# Patient Record
Sex: Female | Born: 1950 | Race: Black or African American | Hispanic: No | Marital: Single | Smoking: Former smoker
Health system: Southern US, Community
[De-identification: ages and names within clinical notes are randomized; demographics above are authoritative.]

## PROBLEM LIST (undated history)

## (undated) DIAGNOSIS — J449 Chronic obstructive pulmonary disease, unspecified: Secondary | ICD-10-CM

## (undated) DIAGNOSIS — E662 Morbid (severe) obesity with alveolar hypoventilation: Secondary | ICD-10-CM

## (undated) DIAGNOSIS — G4733 Obstructive sleep apnea (adult) (pediatric): Secondary | ICD-10-CM

## (undated) DIAGNOSIS — R5381 Other malaise: Secondary | ICD-10-CM

## (undated) DIAGNOSIS — F419 Anxiety disorder, unspecified: Secondary | ICD-10-CM

## (undated) DIAGNOSIS — I5030 Unspecified diastolic (congestive) heart failure: Secondary | ICD-10-CM

## (undated) DIAGNOSIS — E785 Hyperlipidemia, unspecified: Secondary | ICD-10-CM

## (undated) DIAGNOSIS — I499 Cardiac arrhythmia, unspecified: Secondary | ICD-10-CM

## (undated) DIAGNOSIS — E119 Type 2 diabetes mellitus without complications: Secondary | ICD-10-CM

## (undated) DIAGNOSIS — J9610 Chronic respiratory failure, unspecified whether with hypoxia or hypercapnia: Secondary | ICD-10-CM

---

## 2000-08-06 ENCOUNTER — Emergency Department (HOSPITAL_COMMUNITY): Admission: EM | Admit: 2000-08-06 | Discharge: 2000-08-06 | Payer: Self-pay | Admitting: Emergency Medicine

## 2000-08-06 ENCOUNTER — Encounter: Payer: Self-pay | Admitting: Emergency Medicine

## 2000-11-19 ENCOUNTER — Encounter: Payer: Self-pay | Admitting: Family Medicine

## 2000-11-19 ENCOUNTER — Ambulatory Visit (HOSPITAL_COMMUNITY): Admission: RE | Admit: 2000-11-19 | Discharge: 2000-11-19 | Payer: Self-pay | Admitting: Family Medicine

## 2007-01-14 ENCOUNTER — Emergency Department (HOSPITAL_COMMUNITY): Admission: EM | Admit: 2007-01-14 | Discharge: 2007-01-14 | Payer: Self-pay | Admitting: Emergency Medicine

## 2007-02-05 ENCOUNTER — Inpatient Hospital Stay (HOSPITAL_COMMUNITY): Admission: EM | Admit: 2007-02-05 | Discharge: 2007-02-10 | Payer: Self-pay | Admitting: Emergency Medicine

## 2007-02-05 ENCOUNTER — Ambulatory Visit: Payer: Self-pay | Admitting: Internal Medicine

## 2007-02-07 ENCOUNTER — Encounter (INDEPENDENT_AMBULATORY_CARE_PROVIDER_SITE_OTHER): Payer: Self-pay | Admitting: Diagnostic Radiology

## 2007-02-10 DIAGNOSIS — J9620 Acute and chronic respiratory failure, unspecified whether with hypoxia or hypercapnia: Secondary | ICD-10-CM

## 2007-04-22 ENCOUNTER — Emergency Department (HOSPITAL_COMMUNITY): Admission: EM | Admit: 2007-04-22 | Discharge: 2007-04-22 | Payer: Self-pay | Admitting: Emergency Medicine

## 2007-04-23 ENCOUNTER — Ambulatory Visit: Payer: Self-pay | Admitting: Internal Medicine

## 2007-04-23 ENCOUNTER — Inpatient Hospital Stay (HOSPITAL_COMMUNITY): Admission: EM | Admit: 2007-04-23 | Discharge: 2007-04-29 | Payer: Self-pay | Admitting: Emergency Medicine

## 2007-04-27 ENCOUNTER — Encounter (INDEPENDENT_AMBULATORY_CARE_PROVIDER_SITE_OTHER): Payer: Self-pay | Admitting: Internal Medicine

## 2007-04-27 LAB — CONVERTED CEMR LAB
BUN: 5 mg/dL
CO2: 35 meq/L
Chloride: 98 meq/L
Creatinine, Ser: 0.68 mg/dL
Glucose, Bld: 138 mg/dL
Sodium: 139 meq/L

## 2007-04-28 ENCOUNTER — Ambulatory Visit: Payer: Self-pay | Admitting: Physical Medicine & Rehabilitation

## 2007-05-03 ENCOUNTER — Ambulatory Visit: Payer: Self-pay | Admitting: Nurse Practitioner

## 2007-05-03 DIAGNOSIS — G931 Anoxic brain damage, not elsewhere classified: Secondary | ICD-10-CM | POA: Insufficient documentation

## 2007-05-03 DIAGNOSIS — M1710 Unilateral primary osteoarthritis, unspecified knee: Secondary | ICD-10-CM

## 2007-05-03 DIAGNOSIS — E678 Other specified hyperalimentation: Secondary | ICD-10-CM

## 2007-05-03 DIAGNOSIS — R5381 Other malaise: Secondary | ICD-10-CM

## 2007-05-03 DIAGNOSIS — G4733 Obstructive sleep apnea (adult) (pediatric): Secondary | ICD-10-CM

## 2007-05-03 DIAGNOSIS — M549 Dorsalgia, unspecified: Secondary | ICD-10-CM | POA: Insufficient documentation

## 2007-05-03 DIAGNOSIS — J42 Unspecified chronic bronchitis: Secondary | ICD-10-CM | POA: Insufficient documentation

## 2007-05-03 DIAGNOSIS — R5383 Other fatigue: Secondary | ICD-10-CM

## 2007-05-04 ENCOUNTER — Telehealth (INDEPENDENT_AMBULATORY_CARE_PROVIDER_SITE_OTHER): Payer: Self-pay | Admitting: Nurse Practitioner

## 2007-05-16 ENCOUNTER — Telehealth (INDEPENDENT_AMBULATORY_CARE_PROVIDER_SITE_OTHER): Payer: Self-pay | Admitting: Nurse Practitioner

## 2007-05-18 ENCOUNTER — Ambulatory Visit: Payer: Self-pay | Admitting: Nurse Practitioner

## 2007-05-18 DIAGNOSIS — E876 Hypokalemia: Secondary | ICD-10-CM

## 2007-05-23 ENCOUNTER — Telehealth (INDEPENDENT_AMBULATORY_CARE_PROVIDER_SITE_OTHER): Payer: Self-pay | Admitting: Nurse Practitioner

## 2007-05-24 ENCOUNTER — Encounter (INDEPENDENT_AMBULATORY_CARE_PROVIDER_SITE_OTHER): Payer: Self-pay | Admitting: Nurse Practitioner

## 2007-06-15 ENCOUNTER — Ambulatory Visit: Payer: Self-pay | Admitting: Nurse Practitioner

## 2007-06-15 LAB — CONVERTED CEMR LAB
ALT: 17 units/L (ref 0–35)
AST: 17 units/L (ref 0–37)
Alkaline Phosphatase: 93 units/L (ref 39–117)
BUN: 13 mg/dL (ref 6–23)
Basophils Relative: 0 % (ref 0–1)
CO2: 28 meq/L (ref 19–32)
Chloride: 99 meq/L (ref 96–112)
Eosinophils Absolute: 0.2 10*3/uL (ref 0.0–0.7)
Glucose, Bld: 110 mg/dL — ABNORMAL HIGH (ref 70–99)
MCHC: 31.2 g/dL (ref 30.0–36.0)
Monocytes Absolute: 0.7 10*3/uL (ref 0.1–1.0)
Neutro Abs: 5.7 10*3/uL (ref 1.7–7.7)
Neutrophils Relative %: 58 % (ref 43–77)
Platelets: 334 10*3/uL (ref 150–400)
Potassium: 4.7 meq/L (ref 3.5–5.3)
Total Protein: 7.8 g/dL (ref 6.0–8.3)
WBC: 9.8 10*3/uL (ref 4.0–10.5)

## 2007-06-16 ENCOUNTER — Encounter (INDEPENDENT_AMBULATORY_CARE_PROVIDER_SITE_OTHER): Payer: Self-pay | Admitting: Nurse Practitioner

## 2007-06-25 ENCOUNTER — Ambulatory Visit (HOSPITAL_BASED_OUTPATIENT_CLINIC_OR_DEPARTMENT_OTHER): Admission: RE | Admit: 2007-06-25 | Discharge: 2007-06-25 | Payer: Self-pay | Admitting: Nurse Practitioner

## 2007-07-01 ENCOUNTER — Ambulatory Visit: Payer: Self-pay | Admitting: Internal Medicine

## 2007-07-04 ENCOUNTER — Encounter (INDEPENDENT_AMBULATORY_CARE_PROVIDER_SITE_OTHER): Payer: Self-pay | Admitting: Nurse Practitioner

## 2007-07-07 ENCOUNTER — Inpatient Hospital Stay (HOSPITAL_COMMUNITY): Admission: EM | Admit: 2007-07-07 | Discharge: 2007-07-11 | Payer: Self-pay | Admitting: Emergency Medicine

## 2007-07-10 ENCOUNTER — Encounter (INDEPENDENT_AMBULATORY_CARE_PROVIDER_SITE_OTHER): Payer: Self-pay | Admitting: Nurse Practitioner

## 2007-07-17 ENCOUNTER — Telehealth (INDEPENDENT_AMBULATORY_CARE_PROVIDER_SITE_OTHER): Payer: Self-pay | Admitting: Nurse Practitioner

## 2007-08-22 ENCOUNTER — Emergency Department (HOSPITAL_COMMUNITY): Admission: EM | Admit: 2007-08-22 | Discharge: 2007-08-22 | Payer: Self-pay | Admitting: Emergency Medicine

## 2007-08-24 ENCOUNTER — Encounter (INDEPENDENT_AMBULATORY_CARE_PROVIDER_SITE_OTHER): Payer: Self-pay | Admitting: Nurse Practitioner

## 2007-08-30 DIAGNOSIS — I509 Heart failure, unspecified: Secondary | ICD-10-CM

## 2007-08-31 ENCOUNTER — Encounter (INDEPENDENT_AMBULATORY_CARE_PROVIDER_SITE_OTHER): Payer: Self-pay | Admitting: Nurse Practitioner

## 2007-09-04 ENCOUNTER — Telehealth (INDEPENDENT_AMBULATORY_CARE_PROVIDER_SITE_OTHER): Payer: Self-pay | Admitting: Nurse Practitioner

## 2007-09-08 ENCOUNTER — Ambulatory Visit: Payer: Self-pay | Admitting: Nurse Practitioner

## 2007-09-08 DIAGNOSIS — G4700 Insomnia, unspecified: Secondary | ICD-10-CM

## 2007-09-18 ENCOUNTER — Telehealth (INDEPENDENT_AMBULATORY_CARE_PROVIDER_SITE_OTHER): Payer: Self-pay | Admitting: Nurse Practitioner

## 2007-09-20 ENCOUNTER — Encounter (INDEPENDENT_AMBULATORY_CARE_PROVIDER_SITE_OTHER): Payer: Self-pay | Admitting: Nurse Practitioner

## 2007-09-28 ENCOUNTER — Telehealth (INDEPENDENT_AMBULATORY_CARE_PROVIDER_SITE_OTHER): Payer: Self-pay | Admitting: Nurse Practitioner

## 2008-01-17 ENCOUNTER — Ambulatory Visit: Payer: Self-pay | Admitting: Nurse Practitioner

## 2008-01-17 DIAGNOSIS — Z9981 Dependence on supplemental oxygen: Secondary | ICD-10-CM

## 2008-01-17 DIAGNOSIS — L851 Acquired keratosis [keratoderma] palmaris et plantaris: Secondary | ICD-10-CM

## 2008-01-17 LAB — CONVERTED CEMR LAB
ALT: 23 units/L (ref 0–35)
BUN: 16 mg/dL (ref 6–23)
CO2: 31 meq/L (ref 19–32)
Calcium: 9.2 mg/dL (ref 8.4–10.5)
Chloride: 97 meq/L (ref 96–112)
Cholesterol: 243 mg/dL — ABNORMAL HIGH (ref 0–200)
Eosinophils Absolute: 0.2 10*3/uL (ref 0.0–0.7)
Glucose, Bld: 110 mg/dL — ABNORMAL HIGH (ref 70–99)
HDL: 87 mg/dL (ref >39)
Monocytes Absolute: 0.7 10*3/uL (ref 0.1–1.0)
Monocytes Relative: 6 % (ref 3–12)
Neutrophils Relative %: 58 % (ref 43–77)
RDW: 14 % (ref 11.5–15.5)
Sodium: 141 meq/L (ref 135–145)
Total Protein: 7.6 g/dL (ref 6.0–8.3)

## 2008-01-19 ENCOUNTER — Encounter (INDEPENDENT_AMBULATORY_CARE_PROVIDER_SITE_OTHER): Payer: Self-pay | Admitting: Nurse Practitioner

## 2008-01-19 ENCOUNTER — Telehealth (INDEPENDENT_AMBULATORY_CARE_PROVIDER_SITE_OTHER): Payer: Self-pay | Admitting: Nurse Practitioner

## 2008-01-23 ENCOUNTER — Encounter (INDEPENDENT_AMBULATORY_CARE_PROVIDER_SITE_OTHER): Payer: Self-pay | Admitting: Nurse Practitioner

## 2008-01-31 ENCOUNTER — Telehealth (INDEPENDENT_AMBULATORY_CARE_PROVIDER_SITE_OTHER): Payer: Self-pay | Admitting: Nurse Practitioner

## 2008-02-09 ENCOUNTER — Encounter (INDEPENDENT_AMBULATORY_CARE_PROVIDER_SITE_OTHER): Payer: Self-pay | Admitting: Nurse Practitioner

## 2008-02-22 ENCOUNTER — Encounter (INDEPENDENT_AMBULATORY_CARE_PROVIDER_SITE_OTHER): Payer: Self-pay | Admitting: Nurse Practitioner

## 2008-03-13 ENCOUNTER — Ambulatory Visit: Payer: Self-pay | Admitting: Pulmonary Disease

## 2008-03-13 DIAGNOSIS — J9610 Chronic respiratory failure, unspecified whether with hypoxia or hypercapnia: Secondary | ICD-10-CM

## 2008-03-13 DIAGNOSIS — R0602 Shortness of breath: Secondary | ICD-10-CM | POA: Insufficient documentation

## 2008-04-17 ENCOUNTER — Ambulatory Visit: Payer: Self-pay | Admitting: Pulmonary Disease

## 2008-04-17 DIAGNOSIS — I5032 Chronic diastolic (congestive) heart failure: Secondary | ICD-10-CM

## 2008-04-17 DIAGNOSIS — J438 Other emphysema: Secondary | ICD-10-CM | POA: Insufficient documentation

## 2008-04-23 ENCOUNTER — Encounter (INDEPENDENT_AMBULATORY_CARE_PROVIDER_SITE_OTHER): Payer: Self-pay | Admitting: Nurse Practitioner

## 2008-05-29 ENCOUNTER — Encounter (INDEPENDENT_AMBULATORY_CARE_PROVIDER_SITE_OTHER): Payer: Self-pay | Admitting: Nurse Practitioner

## 2008-06-06 ENCOUNTER — Telehealth (INDEPENDENT_AMBULATORY_CARE_PROVIDER_SITE_OTHER): Payer: Self-pay | Admitting: Nurse Practitioner

## 2008-06-24 ENCOUNTER — Telehealth (INDEPENDENT_AMBULATORY_CARE_PROVIDER_SITE_OTHER): Payer: Self-pay | Admitting: Nurse Practitioner

## 2008-07-26 ENCOUNTER — Encounter (INDEPENDENT_AMBULATORY_CARE_PROVIDER_SITE_OTHER): Payer: Self-pay | Admitting: Nurse Practitioner

## 2008-07-30 ENCOUNTER — Encounter (INDEPENDENT_AMBULATORY_CARE_PROVIDER_SITE_OTHER): Payer: Self-pay | Admitting: Nurse Practitioner

## 2008-08-02 ENCOUNTER — Encounter (INDEPENDENT_AMBULATORY_CARE_PROVIDER_SITE_OTHER): Payer: Self-pay | Admitting: Nurse Practitioner

## 2008-09-17 ENCOUNTER — Telehealth (INDEPENDENT_AMBULATORY_CARE_PROVIDER_SITE_OTHER): Payer: Self-pay | Admitting: Nurse Practitioner

## 2008-11-25 ENCOUNTER — Encounter (INDEPENDENT_AMBULATORY_CARE_PROVIDER_SITE_OTHER): Payer: Self-pay | Admitting: Nurse Practitioner

## 2009-02-11 ENCOUNTER — Encounter (INDEPENDENT_AMBULATORY_CARE_PROVIDER_SITE_OTHER): Payer: Self-pay | Admitting: Nurse Practitioner

## 2009-03-24 IMAGING — CT CT HEAD W/O CM
1 series · 16 of 30 positions shown, 20 images · IV contrast (agent unspecified)
Comparison: None.

CLINICAL DATA: Dizziness, diplopia.
 HEAD CT WITHOUT CONTRAST:
TECHNIQUE: Contiguous axial images were obtained from the base of the skull through the vertex according to standard protocol without contrast.

[Series 2: headseq 4.8 h45s · axial · 0.43mm/px · z∈[-201,-73]mm · 16 of 30 slices shown, 20 images]
[im 2/30  brain]
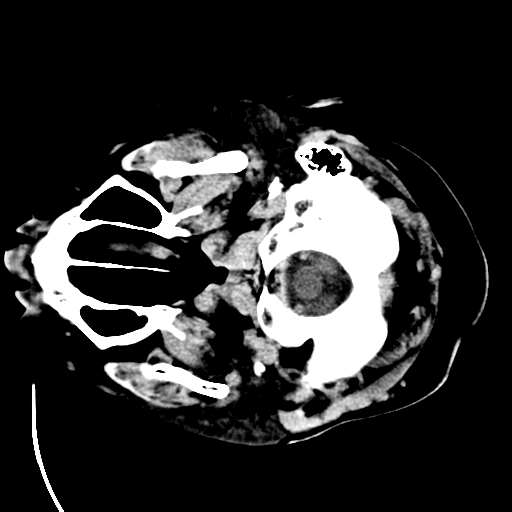
[im 2/30  bone]
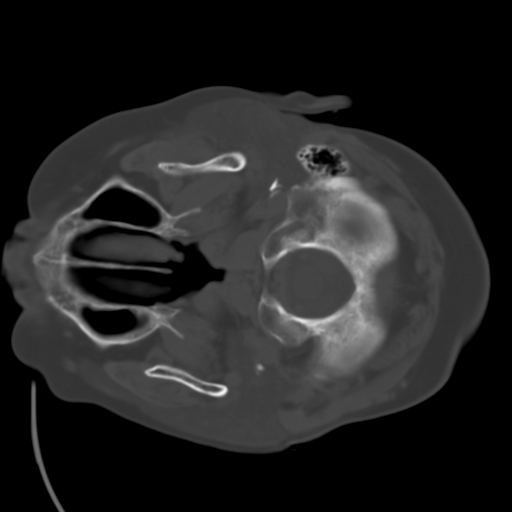
[im 4/30  brain]
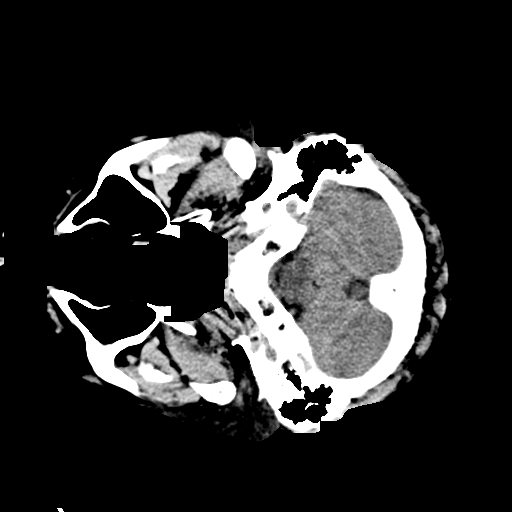
[im 6/30  brain]
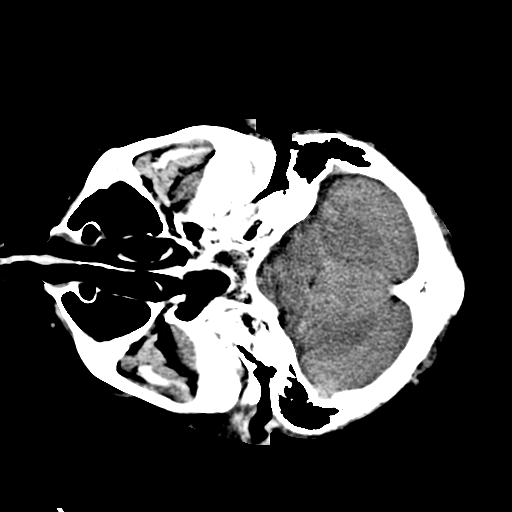
[im 8/30  brain]
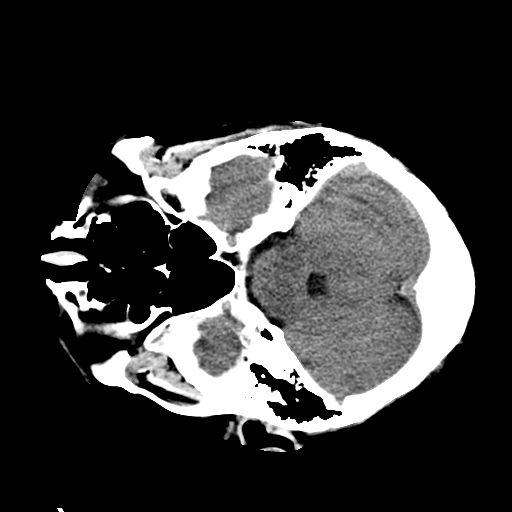
[im 9/30  brain]
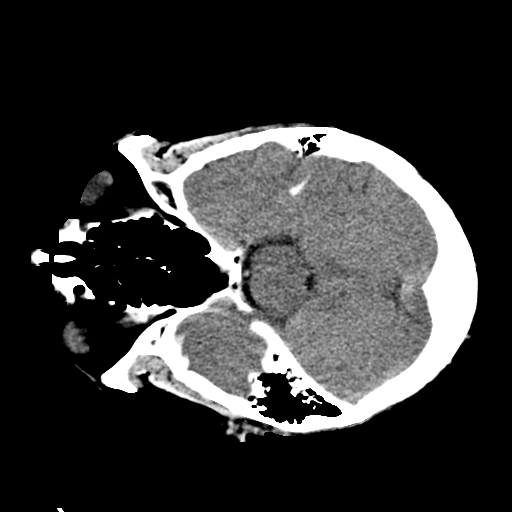
[im 9/30  bone]
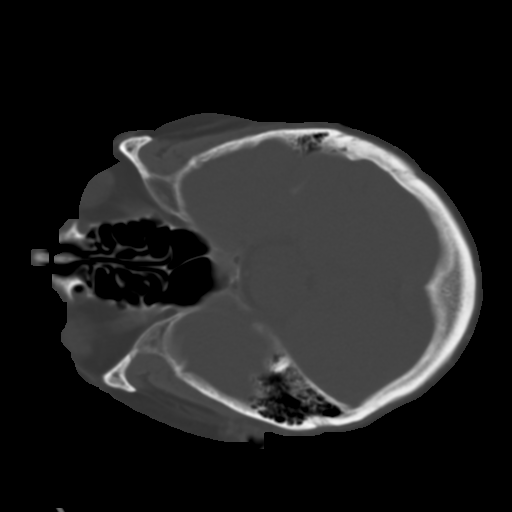
[im 11/30  brain]
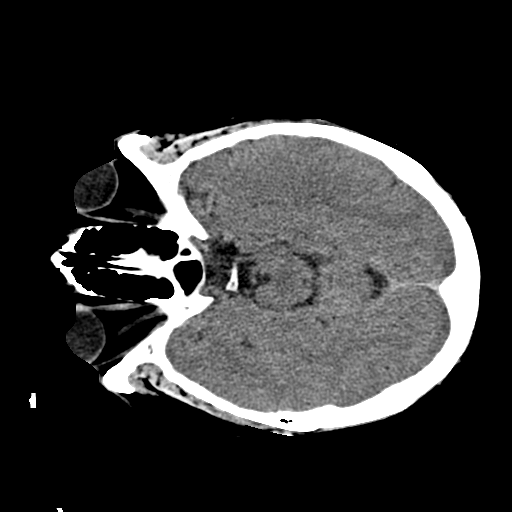
[im 13/30  brain]
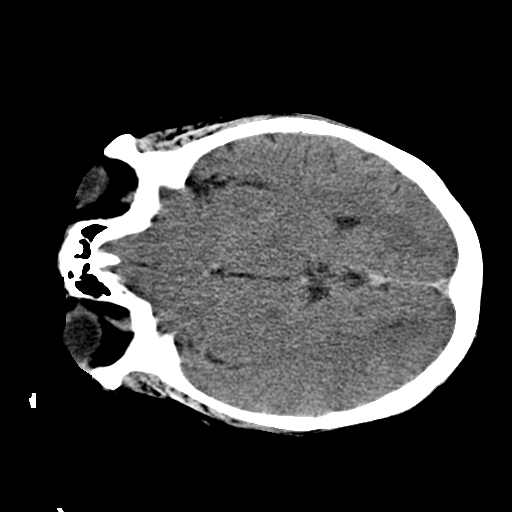
[im 15/30  brain]
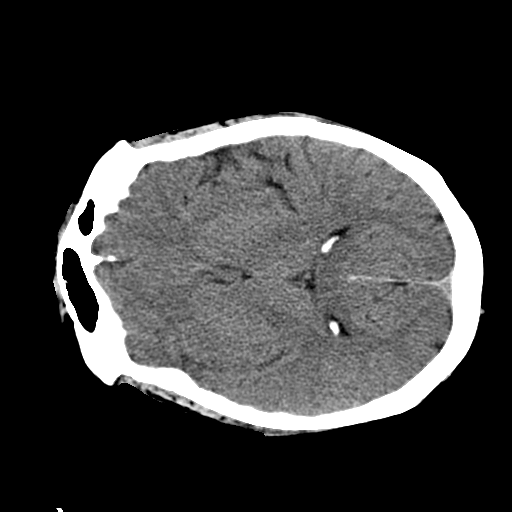
[im 16/30  brain]
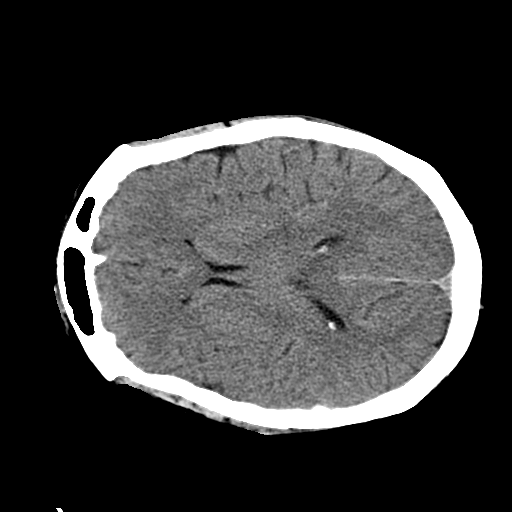
[im 16/30  bone]
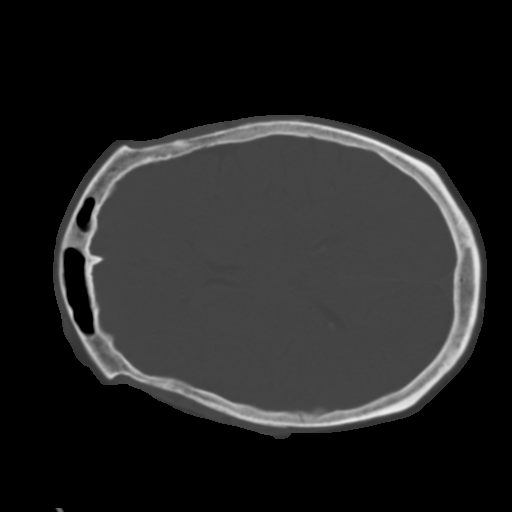
[im 18/30  brain]
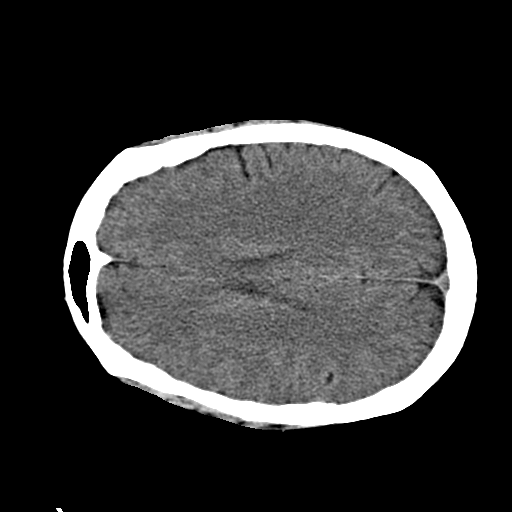
[im 20/30  brain]
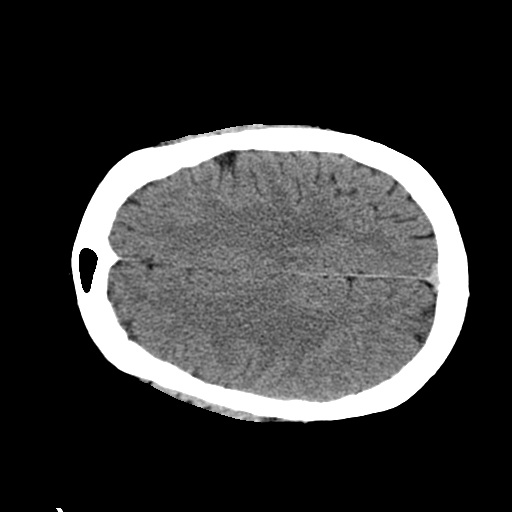
[im 22/30  brain]
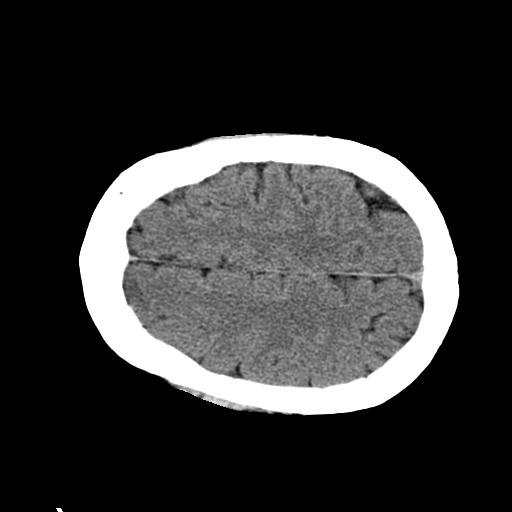
[im 23/30  brain]
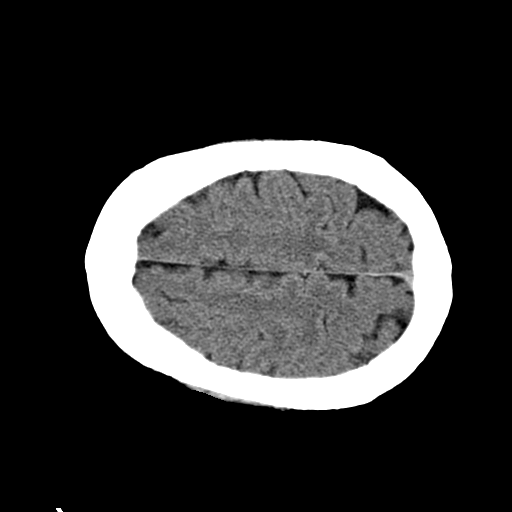
[im 23/30  bone]
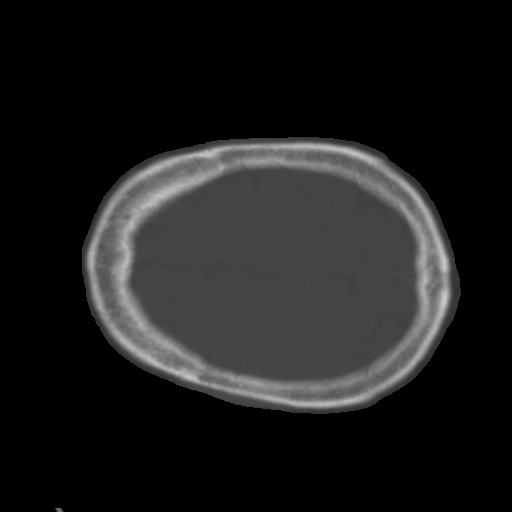
[im 25/30  brain]
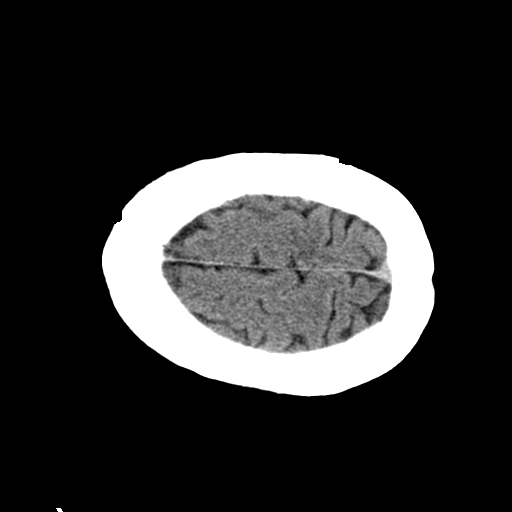
[im 27/30  brain]
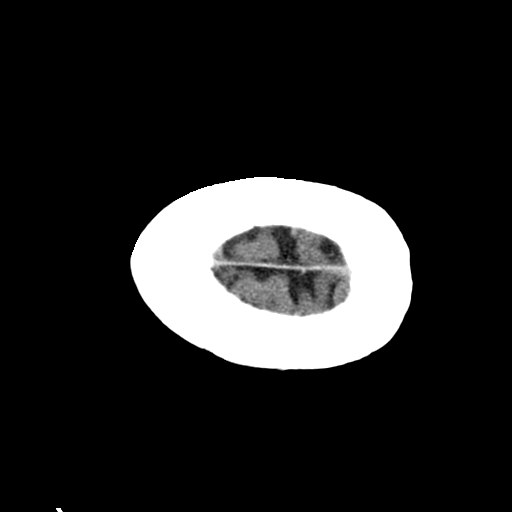
[im 29/30  brain]
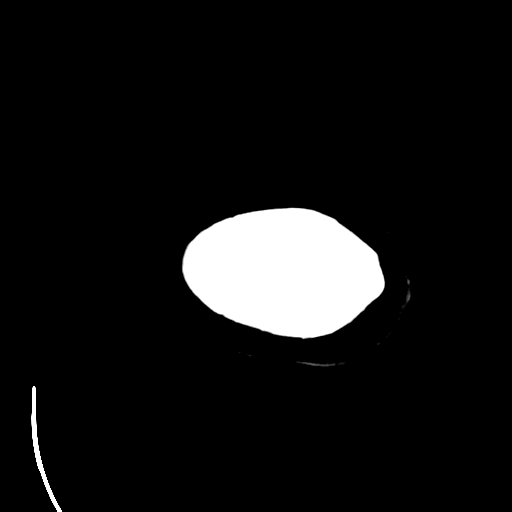

[16 of 30 positions shown; findings below may reference images not displayed]

FINDINGS: The patient was imaged right side down, as she was unable to lie supine for the exam.  There is no evidence of intracranial hemorrhage, brain edema, acute infarct, mass lesion, or mass effect.  No other intraaxial abnormalities are seen, and the ventricles are within normal limits.  No abnormal extraaxial fluid collections or masses are identified.  No skull abnormalities are noted.
IMPRESSION: Negative non-contrast head CT.

## 2009-04-30 ENCOUNTER — Ambulatory Visit: Payer: Self-pay | Admitting: Nurse Practitioner

## 2009-04-30 ENCOUNTER — Telehealth (INDEPENDENT_AMBULATORY_CARE_PROVIDER_SITE_OTHER): Payer: Self-pay | Admitting: Nurse Practitioner

## 2009-04-30 DIAGNOSIS — R209 Unspecified disturbances of skin sensation: Secondary | ICD-10-CM | POA: Insufficient documentation

## 2009-05-21 ENCOUNTER — Ambulatory Visit: Payer: Self-pay | Admitting: Nurse Practitioner

## 2009-06-02 ENCOUNTER — Encounter (INDEPENDENT_AMBULATORY_CARE_PROVIDER_SITE_OTHER): Payer: Self-pay | Admitting: Nurse Practitioner

## 2009-06-05 DIAGNOSIS — E7800 Pure hypercholesterolemia, unspecified: Secondary | ICD-10-CM

## 2009-06-05 LAB — CONVERTED CEMR LAB
ALT: 16 units/L (ref 0–35)
AST: 15 units/L (ref 0–37)
Albumin: 4 g/dL (ref 3.5–5.2)
BUN: 11 mg/dL (ref 6–23)
Basophils Absolute: 0 10*3/uL (ref 0.0–0.1)
Calcium: 9.2 mg/dL (ref 8.4–10.5)
Creatinine, Ser: 0.55 mg/dL (ref 0.40–1.20)
Eosinophils Relative: 2 % (ref 0–5)
Glucose, Bld: 120 mg/dL — ABNORMAL HIGH (ref 70–99)
HDL: 79 mg/dL (ref >39)
Lymphs Abs: 3.2 10*3/uL (ref 0.7–4.0)
MCV: 102.2 fL — ABNORMAL HIGH (ref 78.0–100.0)
Monocytes Relative: 6 % (ref 3–12)
Platelets: 287 10*3/uL (ref 150–400)
Potassium: 5.1 meq/L (ref 3.5–5.3)
RDW: 13.9 % (ref 11.5–15.5)
Sodium: 141 meq/L (ref 135–145)
Total Protein: 7.7 g/dL (ref 6.0–8.3)

## 2009-06-16 ENCOUNTER — Telehealth (INDEPENDENT_AMBULATORY_CARE_PROVIDER_SITE_OTHER): Payer: Self-pay | Admitting: *Deleted

## 2009-06-17 ENCOUNTER — Encounter (INDEPENDENT_AMBULATORY_CARE_PROVIDER_SITE_OTHER): Payer: Self-pay | Admitting: Nurse Practitioner

## 2009-07-08 ENCOUNTER — Telehealth (INDEPENDENT_AMBULATORY_CARE_PROVIDER_SITE_OTHER): Payer: Self-pay | Admitting: Nurse Practitioner

## 2009-07-23 ENCOUNTER — Encounter (INDEPENDENT_AMBULATORY_CARE_PROVIDER_SITE_OTHER): Payer: Self-pay | Admitting: Nurse Practitioner

## 2009-08-11 ENCOUNTER — Encounter (INDEPENDENT_AMBULATORY_CARE_PROVIDER_SITE_OTHER): Payer: Self-pay | Admitting: Nurse Practitioner

## 2009-08-13 ENCOUNTER — Telehealth (INDEPENDENT_AMBULATORY_CARE_PROVIDER_SITE_OTHER): Payer: Self-pay | Admitting: *Deleted

## 2009-08-14 ENCOUNTER — Encounter (INDEPENDENT_AMBULATORY_CARE_PROVIDER_SITE_OTHER): Payer: Self-pay | Admitting: Nurse Practitioner

## 2009-11-21 ENCOUNTER — Encounter (INDEPENDENT_AMBULATORY_CARE_PROVIDER_SITE_OTHER): Payer: Self-pay | Admitting: Nurse Practitioner

## 2010-02-16 ENCOUNTER — Telehealth (INDEPENDENT_AMBULATORY_CARE_PROVIDER_SITE_OTHER): Payer: Self-pay | Admitting: Nurse Practitioner

## 2010-03-13 ENCOUNTER — Telehealth (INDEPENDENT_AMBULATORY_CARE_PROVIDER_SITE_OTHER): Payer: Self-pay | Admitting: Nurse Practitioner

## 2010-03-31 ENCOUNTER — Ambulatory Visit: Payer: Self-pay | Admitting: Nurse Practitioner

## 2010-04-15 ENCOUNTER — Encounter (INDEPENDENT_AMBULATORY_CARE_PROVIDER_SITE_OTHER): Payer: Self-pay | Admitting: Nurse Practitioner

## 2010-04-22 ENCOUNTER — Encounter (INDEPENDENT_AMBULATORY_CARE_PROVIDER_SITE_OTHER): Payer: Self-pay | Admitting: Nurse Practitioner

## 2010-04-22 DIAGNOSIS — L732 Hidradenitis suppurativa: Secondary | ICD-10-CM | POA: Insufficient documentation

## 2010-04-22 LAB — CONVERTED CEMR LAB: LDL Goal: 160 mg/dL

## 2010-04-23 ENCOUNTER — Encounter (INDEPENDENT_AMBULATORY_CARE_PROVIDER_SITE_OTHER): Payer: Self-pay | Admitting: Nurse Practitioner

## 2010-04-23 ENCOUNTER — Encounter: Payer: Self-pay | Admitting: Internal Medicine

## 2010-04-23 LAB — CONVERTED CEMR LAB
AST: 17 units/L (ref 0–37)
Alkaline Phosphatase: 90 units/L (ref 39–117)
CO2: 39 meq/L — ABNORMAL HIGH (ref 19–32)
Calcium: 9.3 mg/dL (ref 8.4–10.5)
Cholesterol: 225 mg/dL — ABNORMAL HIGH (ref 0–200)
HDL: 81 mg/dL (ref >39)
LDL Cholesterol: 125 mg/dL — ABNORMAL HIGH (ref 0–99)
Sodium: 145 meq/L (ref 135–145)
Total Bilirubin: 0.2 mg/dL — ABNORMAL LOW (ref 0.3–1.2)
Total CHOL/HDL Ratio: 2.8
Total Protein: 7.6 g/dL (ref 6.0–8.3)

## 2010-04-28 ENCOUNTER — Other Ambulatory Visit (HOSPITAL_COMMUNITY): Payer: Self-pay | Admitting: Internal Medicine

## 2010-04-28 DIAGNOSIS — Z1239 Encounter for other screening for malignant neoplasm of breast: Secondary | ICD-10-CM

## 2010-04-29 ENCOUNTER — Encounter (INDEPENDENT_AMBULATORY_CARE_PROVIDER_SITE_OTHER): Payer: Self-pay | Admitting: Nurse Practitioner

## 2010-04-30 ENCOUNTER — Ambulatory Visit (HOSPITAL_COMMUNITY): Admission: RE | Admit: 2010-04-30 | Payer: Self-pay | Source: Home / Self Care | Admitting: Internal Medicine

## 2010-05-05 NOTE — Letter (Signed)
Summary: FORM FOR STUDENT LOAN PAYMENT//MAILED TO PT  FORM FOR STUDENT LOAN PAYMENT//MAILED TO PT   Imported By: Arta Bruce 07/23/2009 10:53:11  _____________________________________________________________________  External Attachment:    Type:   Image     Comment:   External Document

## 2010-05-05 NOTE — Progress Notes (Signed)
Summary: Letter Request  Phone Note Call from Patient Call back at Home Phone [phone] Call back at [phone]   Summary of Call: The pt wants that the provider write down a letter that allow her to receive the mail (door to door) [place] [address]  The letter should explain that the pt use oxygen tank  and she is in wheel chair.  The pt needs this letter asap to mail to her  because she needs to send back to the post office.  [facility] FNP Initial call taken by: Manon Hilding,  Aug 13, 2009 4:27 PM  Follow-up for Phone Call        forward to N. Daphine Deutscher, FNP  Additional Follow-up for Phone Call Additional follow up Details #1::        letter done in office for pickup notify pt Additional Follow-up by: Lehman Prom FNP,  Aug 14, 2009 9:00 AM    Additional Follow-up for Phone Call Additional follow up Details #2::    called pt/want me to mail letter, said thank you so much [place] Follow-up by: Arta Bruce,  Aug 14, 2009 10:05 AM

## 2010-05-05 NOTE — Assessment & Plan Note (Signed)
Summary: Emphysema   Vital Signs:  Patient profile:   60 year old female Weight:      253.1 pounds BMI:     41.95 BSA:     2.19 O2 Sat:      100 % on 2 L/min Temp:     98.2 degrees F oral Pulse rate:   119 / minute Pulse rhythm:   regular Resp:     20 per minute BP sitting:   126 / 82  (left arm) Cuff size:   regular  Vitals Entered By: Levon Hedger (April 30, 2009 12:03 PM)  O2 Flow:  2 L/min CC: needs flu and pneumonia shot pt said she got flu shot here last year and pneumonia shot was given 2 years ago at the hospital....tingling in fingers at night , CHF Management Is Patient Diabetic? No Pain Assessment Patient in pain? no       Does patient need assistance? Functional Status Self care Ambulation Normal   Primary Care Provider:  Daphine Deutscher [facility])  CC:  needs flu and pneumonia shot pt said she got flu shot here last year and pneumonia shot was given 2 years ago at the hospital....tingling in fingers at night  and CHF Management.  History of Present Illness:  Pt into the office for routine f/u.  Pt doing relatively well.  Presents today in a wheelchair manual which is hers personally. She is SOB with very little activity.  No use of cane of walker at home because she is just too out of breath.  Obesity - down 10 pounds since her last visit. No meaningful activity at home.  Oxygen - last pulmonary visit was 1 year ago.  Still using oxygen, [facility]  Social - lives with son   Habits & Providers  Alcohol-Tobacco-Diet     Alcohol drinks/day: 0     Tobacco Status: quit     Tobacco Counseling: to remain off tobacco products     Year Quit: 2005  Exercise-Depression-Behavior     Does Patient Exercise: no     Have you felt down or hopeless? no     Have you felt little pleasure in things? no     Drug Use: no     Seat Belt Use: 0     Sun Exposure: rarely  Allergies (verified): 1)  ! Codeine  Review of Systems CV:  Complains of  swelling of hands; intermittent. Resp:  Complains of shortness of breath; with ambulation. GI:  Denies abdominal pain, nausea, and vomiting. MS:  swelling noticed in the left hand but no pain. Neuro:  Complains of tingling; in hand when laying on the right side.  Does not lay on the left side due to breathing.  She has been sleeping on the side for years. Admits that she does also bends her arm while asleep.  when she wakes in the morning the tingling resolves within 30 minutes of getting out of bed.  concern that it is almost every morning now when she wakes.  Physical Exam  General:  alert.   Head:  normocephalic.   Nose:  [place] in place Lungs:  normal breath sounds.   Heart:  normal rate and regular rhythm.   Neurologic:  alert & oriented X3.   Skin:  bil hands with discoloration Psych:  Oriented X3.     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 1 puff in the morning **rinse mouth after use** 2)  Omeprazole 20 Mg Tbec (Omeprazole) .Marland Kitchen.. 1 tablet by mouth two times a day for stomach 3)  Ventolin Hfa 108 (90 Base) Mcg/act Aers (Albuterol sulfate) .... 2 puffs every 6 hours as needed for shortness of breath 4)  Nebulizer Compressor Misc (Nebulizers) .... Dispense  machine to use with nebulizer dx: acute respiratory failure, hypoventilation syndrome 5)  Duoneb 0.5-2.5 (3) Mg/40ml Soln (Ipratropium-albuterol) .... One vial inhalation three times a day as needed for breathing 6)  Oxygen 2 Liters Pulsed  .... 24/7 7)  Hydrochlorothiazide 25 Mg Tabs (Hydrochlorothiazide) .... Take one (1) by mouth daily as needed for fluid 8)  Trazodone Hcl 50 Mg Tabs (Trazodone hcl) .... One tablet by mouth nightly as needed for sleep  Other Orders: Flu Vaccine 3yrs + ([address]) Admin 1st Vaccine ([address]) Admin 1st Vaccine [facility]) [phone])  CHF Assessment/Plan:      The patient's current weight is 253.1 pounds.  Her previous weight was 263 pounds.    Patient Instructions: 1)  You have been given the flu vaccine today. 2)  You will need to do exercise for the legs. 3)  stretch up against the wall for 5 times each legs 4)  also roll on a can back and forth  5)  You can use the weights/ cans in your hand to lift start with 1 pound. 6)  Follow up in 2 weeks for fasting labs 7)  Do not eat after midnight before this visit 8)  Will need lipids, cmp, cbc, tsh. Prescriptions: ADVAIR DISKUS 250-50 MCG/DOSE  MISC (FLUTICASONE-SALMETEROL) 1 puff in the morning **Rinse mouth after use**  #1 x 6   Entered and Authorized by:   Lehman Prom FNP   Signed by:   Lehman Prom FNP on 04/30/2009   Method used:   Printed then faxed to ...       [facility] (retail)       [address]       [place], [place]  [address]       Ph: [phone]       Fax: [phone]   RxID:   [id number] TRAZODONE HCL 50 MG TABS (TRAZODONE HCL) One tablet by mouth nightly as needed for sleep  #30 x 0   Entered and Authorized by:   Lehman Prom FNP   Signed by:   Lehman Prom FNP on 04/30/2009   Method used:   Printed then faxed to ...       [facility] (retail)       [address]       [place], [place]  [address]       Ph:  [phone]       Fax: [phone]   RxID:   [id number]  HYDROCHLOROTHIAZIDE 25 MG TABS (HYDROCHLOROTHIAZIDE) Take one (1) by mouth daily as needed for fluid  #30 x 1   Entered and Authorized by:   Lehman Prom FNP   Signed by:   Lehman Prom FNP on 04/30/2009   Method used:   Print then Give to Patient   RxID:   [id number] HYDROCHLOROTHIAZIDE 25 MG TABS (HYDROCHLOROTHIAZIDE) Take one (1) by mouth daily as needed for fluid  #30 x 1   Entered and Authorized by:   Lehman Prom FNP   Signed by:   Lehman Prom FNP on 04/30/2009   Method used:   Print then Give to Patient   RxID:   [id number]    Influenza Vaccine    Vaccine Type: Fluvax 3+    Site: left deltoid    Mfr: Sanofi Pasteur    Dose: 0.5 ml    Route: IM    Given by: Levon Hedger    Exp. Date: 10/02/2009    Lot #: [id number]    VIS given: 10/27/06 version given April 30, 2009.  Flu Vaccine Consent Questions    Do you have a history of severe allergic reactions to this vaccine? no    Any prior history of allergic reactions to egg and/or gelatin? no    Do you have a sensitivity to the preservative Thimersol? no    Do you have a past history of Guillan-Barre Syndrome? no    Do you currently have an acute febrile illness? no    Have you ever had a severe reaction to latex? no    Vaccine information given and explained to patient? yes    Are you currently pregnant? no

## 2010-05-05 NOTE — Letter (Signed)
Summary: *Referral Letter  [facility]  [address] [place], [place] [address]   Phone: [phone]  Fax: [phone]    08/14/2009   [name] [address] [place], [place]  [address]  Phone: [phone]  To whom it may concern:   This letter is regarding [name]. See below for her medical problems.  Of significance, is her arthritis which limits her mobility and thus she is wheelchair bound.  She also has some respiratory illnesses for which she requires 24 hour oxygen.  Her mobility is significantly decreased.  Therefore, it is the request of this provider to have her mail delivered to her door.  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  Sincerely,   Juliet Rude [facility]

## 2010-05-05 NOTE — Progress Notes (Signed)
  Phone Note Call from Patient Call back at [facility] Phone [phone]   Summary of Call: Pt totally forgot to let know the provider during the visit that she has headache, sinus infection and her ear has some fluid.  Pt is not taking anything for this problem.[facility] [phone]. [facility] FNP Initial call taken by: Manon Hilding,  April 30, 2009 2:29 PM  Follow-up for Phone Call        pt can take lorantadine 10mg  by mouth daily as needed for the symptoms.  Rx in basket Follow-up by: Lehman Prom FNP,  April 30, 2009 2:55 PM  Additional Follow-up for Phone Call Additional follow up Details #1::        RX FAXED TO [facility] TODAY Additional Follow-up by: Leodis Rains,  May 01, 2009 11:42 AM    New/Updated Medications: LORATADINE 10 MG TABS (LORATADINE) One tablet by mouth daily as needed for allergies  Prescriptions: LORATADINE 10 MG TABS (LORATADINE) One tablet by mouth daily as needed for allergies  #30 x 3   Entered and Authorized by:   Lehman Prom FNP   Signed by:   Lehman Prom FNP on 04/30/2009   Method used:   Printed then faxed to ...       [facility] (retail)       [address]       [place], [place]  [address]       Ph: [phone]       Fax: [phone]   RxID:   [id number]

## 2010-05-05 NOTE — Letter (Signed)
Summary: DURABLE MEDICAL EQUIPMENT  DURABLE MEDICAL EQUIPMENT   Imported By: Arta Bruce 08/11/2009 14:39:06  _____________________________________________________________________  External Attachment:    Type:   Image     Comment:   External Document

## 2010-05-05 NOTE — Progress Notes (Signed)
Summary: Query:  REFILL pravastatin?  Phone Note Call from Patient Call back at Home Phone [phone]   Reason for Call: Refill Medication Summary of Call: MARTIN PT. MS [place]oster CALLED FOR REFILLS ON HER CHOLESTEROL MEDS.  SHE [facility].  MS Fetterly SCHEDU AN APPT. FOR NEXT AVAILABLE 12-27 Initial call taken by: Leodis Rains,  February 16, 2010 2:29 PM  Follow-up for Phone Call        Do you want to refill her meds?  Last seen 04/2009.  Dutch Quint RN  February 16, 2010 2:59 PM   Additional Follow-up for Phone Call Additional follow up Details #1::        yes give her 30 days worth. will refill with more on f/u Additional Follow-up by: Lehman Prom FNP,  February 16, 2010 4:19 PM    Additional Follow-up for Phone Call Additional follow up Details #2::    Refill completed.  Dutch Quint RN  February 16, 2010 4:22 PM   Prescriptions: PRAVASTATIN SODIUM 10 MG TABS (PRAVASTATIN SODIUM) One tablet by mouth nightly for cholesterol  #30 x 0   Entered by:   Dutch Quint RN   Authorized by:   Lehman Prom FNP   Signed by:   Dutch Quint RN on 02/16/2010   Method used:   Faxed to ...       [facility] (retail)       [address]       [place], [place]  [address]       Ph: [phone]       Fax: [phone]   RxID:   [id number]

## 2010-05-05 NOTE — Letter (Signed)
Summary: discharge application  discharge application   Imported By: Arta Bruce 06/17/2009 08:40:42  _____________________________________________________________________  External Attachment:    Type:   Image     Comment:   External Document

## 2010-05-05 NOTE — Progress Notes (Signed)
Summary: Form  Phone Note Outgoing Call   Summary of Call: form completed and at Dr.Mulberry's desk to sign make copy for the chart then send original back when she signs Initial call taken by: Lehman Prom FNP,  June 16, 2009 9:17 AM  Follow-up for Phone Call        CALLED PT LET HER KNOW FORM IS IN THE MAIL Follow-up by: Arta Bruce,  June 17, 2009 8:44 AM

## 2010-05-05 NOTE — Progress Notes (Signed)
Summary: Medicaid wont cover her Advair  Phone Note Call from Patient Call back at Home Phone [phone]   Reason for Call: Refill Medication Summary of Call: martin pt. ms Cyrene Starliper says that [facility] told her that Medicaid wont pay for her Advair. She says that she has about 2 or 3 puffs left. Initial call taken by: Leodis Rains,  July 08, 2009 4:56 PM  Follow-up for Phone Call        forward to N. Daphine Deutscher, FNP Follow-up by: Levon Hedger,  July 09, 2009 12:27 PM  Additional Follow-up for Phone Call Additional follow up Details #1::        notify pt that prior approval obtained today pharmacy can resend the Rx and medicaid should cover Additional Follow-up by: Lehman Prom FNP,  July 09, 2009 12:53 PM    Additional Follow-up for Phone Call Additional follow up Details #2::    pt informed of above information.  Pt has medication Follow-up by: Levon Hedger,  July 11, 2009 9:09 AM  New/Updated Medications: ADVAIR DISKUS 250-50 MCG/DOSE  MISC (FLUTICASONE-SALMETEROL) 1 puff in the morning **Rinse mouth after use**

## 2010-05-05 NOTE — Letter (Signed)
Summary: DURABLE MEDICAL EQUIPMENT  DURABLE MEDICAL EQUIPMENT   Imported By: Arta Bruce 11/24/2009 16:32:06  _____________________________________________________________________  External Attachment:    Type:   Image     Comment:   External Document

## 2010-05-05 NOTE — Letter (Signed)
Summary: REQUESTED RECORDS MAILED TO [facility]  REQUESTED RECORDS MAILED TO [facility]   Imported By: Arta Bruce 06/02/2009 11:35:08  _____________________________________________________________________  External Attachment:    Type:   Image     Comment:   External Document

## 2010-05-06 ENCOUNTER — Encounter: Payer: Self-pay | Admitting: Internal Medicine

## 2010-05-07 NOTE — Letter (Signed)
Summary: Lipid Letter  [facility]  [address] [place], [place] [address]   Phone: [phone]  Fax: [phone]    04/23/2010  Shakena Callari [address] [place], [place]  [address]  Dear Ms. Hellickson:  We have carefully reviewed your last lipid profile from 04/22/2010 and the results are noted below with a summary of recommendations for lipid management.    Cholesterol:       225     Goal: less than 200   HDL "good" Cholesterol:   81     Goal: greater than 40   LDL "bad" Cholesterol:   125     Goal: less than 100   Triglycerides:       96     Goal: less than 150    Cholesterol is still slighty elevated.  You should have spoken with this office regarding an increase in your medications.     Current Medications: 1)    Ventolin Hfa 108 (90 Base) Mcg/act  Aers (Albuterol sulfate) .... 2 puffs every 6 hours as needed for shortness of breath 2)    Nebulizer Compressor  Misc (Nebulizers) .... Dispense machine to use with nebulizer dx: acute respiratory failure, hypoventilation syndrome 3)    Duoneb 0.5-2.5 (3) Mg/42ml Soln (Ipratropium-albuterol) .... One vial inhalation three times a day as needed for breathing 4)    Oxygen 2 Liters Pulsed  .... 24/7 5)    Hydrochlorothiazide 25 Mg Tabs (Hydrochlorothiazide) .... Take one (1) by mouth daily as needed for fluid 6)    Trazodone Hcl 50 Mg Tabs (Trazodone hcl) .... One tablet by mouth nightly as needed for sleep 7)    Cetirizine Hcl 10 Mg Tabs (Cetirizine hcl) .... One tablet by mouth nightly for allergies 8)    Pravastatin Sodium 20 Mg Tabs (Pravastatin sodium) .... One tablet by mouth nightly for cholesterl **note increase in dose** 9)    Cephalexin 500 Mg Caps (Cephalexin) .... One tablet by mouth three times a day for infection  If you have any questions, please call. We appreciate being able to work with you.   Sincerely,    [facility] Lehman Prom FNP

## 2010-05-07 NOTE — Letter (Signed)
Summary: [facility] MEDICAL EQUIPMENT//FAXED  [facility] MEDICAL EQUIPMENT//FAXED   Imported By: Arta Bruce 04/29/2010 12:10:56  _____________________________________________________________________  External Attachment:    Type:   Image     Comment:   External Document

## 2010-05-07 NOTE — Assessment & Plan Note (Signed)
Summary: Skin problems   Vital Signs:  Patient profile:   60 year old female Weight:      261.4 pounds BMI:     43.32 O2 Sat:      86 % on 2 L/min Temp:     97.3 degrees F oral Pulse rate:   124 / minute Pulse rhythm:   regular Resp:     26 per minute BP sitting:   130 / 80  (left arm) Cuff size:   regular  Vitals Entered By: Levon Hedger (April 22, 2010 12:42 PM)  Nutrition Counseling: Patient's BMI is greater than 25 and therefore counseled on weight management options.  O2 Flow:  2 L/min CC: needs a physical...swelling around nipple area, Lipid Management Is Patient Diabetic? No Pain Assessment Patient in pain? no       Does patient need assistance? Functional Status Self care Ambulation Normal   Primary Care Provider:  Daphine Deutscher [facility])  CC:  needs a physical...swelling around nipple area and Lipid Management.  History of Present Illness:  Pt into the office with c/o breast and left underarm tenderness reports areas under left axilla "bust" and drain this has been ongoing for years left breast - nippled most affected. Similar papular lesions that drain and then crust over  Lipid Management History:      Positive NCEP/ATP III risk factors include female age 45 years old or older.  Negative NCEP/ATP III risk factors include no history of early menopause without estrogen hormone replacement, non-diabetic, HDL cholesterol greater than 60, no family history for ischemic heart disease, non-tobacco-user status, non-hypertensive, no ASHD (atherosclerotic heart disease), no prior stroke/TIA, no peripheral vascular disease, and no history of aortic aneurysm.        She expresses no side effects from her lipid-lowering medication.  The patient denies any symptoms to suggest myopathy or liver disease.     Allergies (verified): 1)  ! Codeine  Review of Systems General:  Complains of sleep disorder; denies fatigue. CV:  Denies chest pain or discomfort. Resp:   Denies cough. GI:  Denies abdominal pain, nausea, and vomiting blood. Derm:  Complains of lesion(s).  Physical Exam  General:  alert.   Head:  normocephalic.   Neurologic:  wheelchair Skin:  dry left axilla - nodular tender lesions  left breast - areola with papular lesions x 2, crusty drainage Psych:  Oriented X3.     Impression & Recommendations:  Problem # 1:  HIDRADENITIS SUPPURATIVA (ICD-705.83) advised pt of dx apply warm compresses to affected area  Problem # 2:  HYPERCHOLESTEROLEMIA (ICD-272.0) will check labs today Her updated medication list for this problem includes:    Pravastatin Sodium 10 Mg Tabs (Pravastatin sodium) ..... One tablet by mouth nightly for cholesterol  Orders: T-Lipid Profile ([address]109-60454) T-Comprehensive Metabolic Panel (09811-91478)  Problem # 3:  UNSPECIFIED BREAST SCREENING (ICD-V76.10) no mammogram in many years will schedule Orders: Mammogram (Screening) (Mammo)  Problem # 4:  NEED PROPHYLACTIC VACCINATION&INOCULATION FLU (ICD-V04.81) given today in office  Complete Medication List: 1)  Ventolin Hfa 108 (90 Base) Mcg/act Aers (Albuterol sulfate) .... 2 puffs every 6 hours as needed for shortness of breath 2)  Nebulizer Compressor Misc (Nebulizers) .... Dispense machine to use with nebulizer dx: acute respiratory failure, hypoventilation syndrome 3)  Duoneb 0.5-2.5 (3) Mg/56ml Soln (Ipratropium-albuterol) .... One vial inhalation three times a day as needed for breathing 4)  Oxygen 2 Liters Pulsed  .... 24/7 5)  Hydrochlorothiazide 25 Mg Tabs (Hydrochlorothiazide) .... Take one (  1) by mouth daily as needed for fluid 6)  Trazodone Hcl 50 Mg Tabs (Trazodone hcl) .... One tablet by mouth nightly as needed for sleep 7)  Cetirizine Hcl 10 Mg Tabs (Cetirizine hcl) .... One tablet by mouth nightly for allergies 8)  Pravastatin Sodium 10 Mg Tabs (Pravastatin sodium) .... One tablet by mouth nightly for cholesterol 9)  Cephalexin 500 Mg Caps  (Cephalexin) .... One tablet by mouth three times a day for infection  Other Orders: Flu Vaccine 76yrs + (60109) Admin 1st Vaccine (32355) Pulse Oximetry (single measurment) (73220)  Lipid Assessment/Plan:      Based on NCEP/ATP III, the patient's risk factor category is "0-1 risk factors".  The patient's lipid goals are as follows: Total cholesterol goal is 200; LDL cholesterol goal is 160; HDL cholesterol goal is 40; Triglyceride goal is 150.    Patient Instructions: 1)  You have been given the flu vaccine today. 2)  You have been scheduled for a mammogram.  Please be sure to keep this appointment. 3)  Breast and under arm - apply warm compresses. 4)  You should start keflex 500mg  by mouth three times a day for infection 5)  Your labs will be checked and you will be notified of the results. 6)  If you re-consider and decide you would like this provider to contact care agencies to see if you qualify for some in home assistance then inform this provider 7)  Call for a follow up in 6 months or sooner if necessary 8)  Will need to discuss getting colonscopy Prescriptions: CEPHALEXIN 500 MG CAPS (CEPHALEXIN) One tablet by mouth three times a day for infection  #30 x 0   Entered and Authorized by:   Lehman Prom FNP   Signed by:   Lehman Prom FNP on 04/22/2010   Method used:   Electronically to        [facility] (retail)       [address]       [address]       [place], [place]  [address]       Ph: [phone]       Fax: [phone]   RxID:   [id number] TRAZODONE HCL 50 MG TABS (TRAZODONE HCL) One tablet by mouth nightly as needed for sleep  #30 x 1   Entered and Authorized by:   Lehman Prom FNP   Signed by:   Lehman Prom FNP on 04/22/2010   Method used:   Electronically to        [facility] (retail)       [address]       [place], [place]  [address]       Ph: [phone]       Fax: [phone]   RxID:    [id number] CETIRIZINE HCL 10 MG TABS (CETIRIZINE HCL) One tablet by mouth nightly for allergies  #30 x 5   Entered and Authorized by:   Lehman Prom FNP   Signed by:   Lehman Prom FNP on 04/22/2010   Method used:   Electronically to        [facility] (retail)       [address]       [place], [place]  [address]       Ph: [phone]       Fax: [phone]  RxID:   [id number] HYDROCHLOROTHIAZIDE 25 MG TABS (HYDROCHLOROTHIAZIDE) Take one (1) by mouth daily as needed for fluid  #30 x 1   Entered and Authorized by:   Lehman Prom FNP   Signed by:   Lehman Prom FNP on 04/22/2010   Method used:   Electronically to        [facility] (retail)       [address]       [place], [place]  [address]       Ph: [phone]       Fax: [phone]   RxID:   [id number]    Orders Added: 1)  Flu Vaccine 11yrs + [64403] 2)  Admin 1st Vaccine [90471] 3)  Mammogram (Screening) [Mammo] 4)  Est. Patient Level IV [47425] 5)  T-Lipid Profile [80061-22930] 6)  T-Comprehensive Metabolic Panel [80053-22900] 7)  Pulse Oximetry (single measurment) [94760]   Immunizations Administered:  Influenza Vaccine # 1:    Vaccine Type: Fluvax 3+    Site: left deltoid    Mfr: GlaxoSmithKline    Dose: 0.5 ml    Route: IM    Given by: Levon Hedger    Exp. Date: 10/03/2010    Lot #: [id number]    VIS given: 10/28/09 version given April 22, 2010.  Flu Vaccine Consent Questions:    Do you have a history of severe allergic reactions to this vaccine? no    Any prior history of allergic reactions to egg and/or gelatin? no    Do you have a sensitivity to the preservative Thimersol? no    Do you have a past history of Guillan-Barre Syndrome? no    Do you currently have an acute febrile illness? no    Have you ever had a severe reaction to latex? no    Vaccine information given and explained to patient? yes    Are  you currently pregnant? no    ndc  [phone]  Immunizations Administered:  Influenza Vaccine # 1:    Vaccine Type: Fluvax 3+    Site: left deltoid    Mfr: GlaxoSmithKline    Dose: 0.5 ml    Route: IM    Given by: Levon Hedger    Exp. Date: 10/03/2010    Lot #: [id number]    VIS given: 10/28/09 version given April 22, 2010.

## 2010-05-07 NOTE — Letter (Signed)
Summary: TEST ORDER FORM MAMMOGRAM//APPT DATE & TIME  TEST ORDER FORM MAMMOGRAM//APPT DATE & TIME   Imported By: Arta Bruce 04/24/2010 14:35:16  _____________________________________________________________________  External Attachment:    Type:   Image     Comment:   External Document

## 2010-05-07 NOTE — Progress Notes (Signed)
Summary: Query:  Refill pravastatin?  Phone Note Outgoing Call   Summary of Call: Has appt. 12/27.  Last seen 04/2009.  Refill pravastatin? Initial call taken by: Dutch Quint RN,  March 13, 2010 6:14 PM  Follow-up for Phone Call        yes, ok to refill call pt and remind her to be fasting for labs at her appt on 12/27 Follow-up by: Lehman Prom FNP,  March 16, 2010 8:05 AM  Additional Follow-up for Phone Call Additional follow up Details #1::        Rx. faxed to [facility]. Left message on answer machine for pt. to return call.  Gaylyn Cheers RN  March 16, 2010 9:15 AM       Additional Follow-up for Phone Call Additional follow up Details #2::    pt is aware Follow-up by: Arm[place] Shannon,  March 16, 2010 10:21 AM  Prescriptions: PRAVASTATIN SODIUM 10 MG TABS (PRAVASTATIN SODIUM) One tablet by mouth nightly for cholesterol  #30 x 0   Entered by:   Gaylyn Cheers RN   Authorized by:   Lehman Prom FNP   Signed by:   Gaylyn Cheers RN on 03/16/2010   Method used:   Print then Give to Patient   RxID:   [id number]

## 2010-05-07 NOTE — Letter (Signed)
Summary: [facility]   Imported By: Arta Bruce 04/17/2010 16:52:15  _____________________________________________________________________  External Attachment:    Type:   Image     Comment:   External Document

## 2010-05-12 ENCOUNTER — Ambulatory Visit (HOSPITAL_COMMUNITY)
Admission: RE | Admit: 2010-05-12 | Discharge: 2010-05-12 | Disposition: A | Discharge status: Home / Self Care | Payer: Medicaid Other | Source: Ambulatory Visit | Attending: Internal Medicine | Admitting: Internal Medicine

## 2010-05-12 ENCOUNTER — Other Ambulatory Visit: Payer: Self-pay | Admitting: Internal Medicine

## 2010-05-12 DIAGNOSIS — Z1239 Encounter for other screening for malignant neoplasm of breast: Secondary | ICD-10-CM

## 2010-05-12 DIAGNOSIS — N6320 Unspecified lump in the left breast, unspecified quadrant: Secondary | ICD-10-CM

## 2010-05-13 ENCOUNTER — Encounter (INDEPENDENT_AMBULATORY_CARE_PROVIDER_SITE_OTHER): Payer: Self-pay | Admitting: Nurse Practitioner

## 2010-05-13 NOTE — Letter (Signed)
Summary: [facility]  [facility]   Imported By: Lester Catano 05/05/2010 08:28:09  _____________________________________________________________________  External Attachment:    Type:   Image     Comment:   External Document

## 2010-05-18 ENCOUNTER — Ambulatory Visit
Admission: RE | Admit: 2010-05-18 | Discharge: 2010-05-18 | Disposition: A | Discharge status: Home / Self Care | Payer: Medicaid Other | Source: Ambulatory Visit | Attending: Internal Medicine | Admitting: Internal Medicine

## 2010-05-18 DIAGNOSIS — N6320 Unspecified lump in the left breast, unspecified quadrant: Secondary | ICD-10-CM

## 2010-05-21 NOTE — Letter (Signed)
Summary: MED/SOLUTION//APPROVED  MED/SOLUTION//APPROVED   Imported By: Arta Bruce 05/14/2010 11:30:15  _____________________________________________________________________  External Attachment:    Type:   Image     Comment:   External Document

## 2010-05-28 ENCOUNTER — Encounter (INDEPENDENT_AMBULATORY_CARE_PROVIDER_SITE_OTHER): Payer: Self-pay | Admitting: Nurse Practitioner

## 2010-05-28 ENCOUNTER — Telehealth (INDEPENDENT_AMBULATORY_CARE_PROVIDER_SITE_OTHER): Payer: Self-pay | Admitting: Nurse Practitioner

## 2010-06-02 NOTE — Letter (Signed)
Summary: [facility]//FAXED  [facility]//FAXED   Imported By: Arta Bruce 05/28/2010 11:17:07  _____________________________________________________________________  External Attachment:    Type:   Image     Comment:   External Document

## 2010-06-02 NOTE — Progress Notes (Signed)
Summary: ADVAIR NEEDS REFILL  Phone Note Call from Patient Call back at Home Phone [phone]   Reason for Call: Refill Medication Summary of Call: [name] SAYS SHE NEEDS A OTHER REFILL ON HER ADVAIR INHALER. [facility]. Initial call taken by: Leodis Rains,  May 28, 2010 10:12 AM  Follow-up for Phone Call        Advair was removed from her med list at last OV on 04/22/10 - reason unknown.  Dutch Quint RN  May 28, 2010 10:17 AM   Additional Follow-up for Phone Call Additional follow up Details #1::        med d/c'ed because pt had not been taking it for 6 months. Hopefully she is not symptomatic which is why she wants it now will re-order and fax to [facility] as requested Additional Follow-up by: Lehman Prom FNP,  May 28, 2010 10:36 AM    New/Updated Medications: ADVAIR DISKUS 250-50 MCG/DOSE AEPB (FLUTICASONE-SALMETEROL) One inhalation two times a day for breathing Prescriptions: ADVAIR DISKUS 250-50 MCG/DOSE AEPB (FLUTICASONE-SALMETEROL) One inhalation two times a day for breathing  #1 x 6   Entered and Authorized by:   Lehman Prom FNP   Signed by:   Lehman Prom FNP on 05/28/2010   Method used:   Electronically to        [facility]* (retail)       [address]       [place], [place]  [address]       Ph: [phone]       Fax: [phone]   RxID:   [id number]   Appended Document: ADVAIR NEEDS REFILL Pt. notified of refill - it's already been delivered.  Takes it once a day, not two.  States she's been taking it all along, no change in condition.  Dutch Quint RN  May 28, 2010  2:08 PM

## 2010-06-04 ENCOUNTER — Encounter (INDEPENDENT_AMBULATORY_CARE_PROVIDER_SITE_OTHER): Payer: Self-pay | Admitting: Nurse Practitioner

## 2010-06-11 NOTE — Letter (Signed)
Summary: [facility]//DURABLE MEDICAL EQUIPMENT //FAXED  [facility]//DURABLE MEDICAL EQUIPMENT //FAXED   Imported By: Arta Bruce 06/04/2010 09:45:35  _____________________________________________________________________  External Attachment:    Type:   Image     Comment:   External Document

## 2010-08-18 NOTE — Discharge Summary (Signed)
NAMEGARI, TROVATO                 ACCOUNT NO.:  [id number]   MEDICAL RECORD NO.:  [id number]          PATIENT TYPE:  INP   LOCATION:  1231                         FACILITY:  [facility]   PHYSICIAN:  Elliot Cousin, M.D.    DATE OF BIRTH:  26-Nov-1950   DATE OF ADMISSION:  02/05/2007  DATE OF DISCHARGE:  02/10/2007                               DISCHARGE SUMMARY   DISCHARGE DIAGNOSES:  1. Progressive dyspnea thought to be secondary to obesity      hypoventilation syndrome, COPD, and possibly obstructive sleep      apnea.  2. Chronic hypercapnic and hypoxic respiratory failure requiring      nocturnal BiPAP.  (The settings are 6/12).  3. Last ABG recorded, on February 09, 2007, revealed a pH of 7.38, pCO2      of 60.2, and a pO2 of 77.6.  The ABG was on BiPAP and an FIO2 of      30%.  The patient's oxygen saturation on the day of hospital      discharge was 84% on room air.  4. Altered mental status, dizziness, vertigo, and myoclonic movements,      all thought to be secondary to hypercapnia and hypoxia.  These      symptoms completely resolved prior to hospital discharge.  5. Morbid obesity.  6. Low TSH but normal free T4.  7. Deconditioning.   DISCHARGE MEDICATIONS:  1. BiPAP 6/12 q.h.s.  2. Oxygen 2-3 liters per minute.  3. Prednisone taper take as prescribed.  4. Albuterol MDI 2 puffs every 4 hours as needed.  5. Spiriva one inhalation daily.  6. Symbicort 160/4.5 two puffs b.i.d.   DISCHARGE DISPOSITION:  The patient was discharged to home in improved  and stable condition on February 10, 2007.  She was advised to follow up  with her primary care physician in 1-2 weeks.  She was advised to follow  up with pulmonologist Dr. Craige Cotta on December 2 at 2:30 p.m.Marland Kitchen  She also has  a sleep study scheduled for November 11 at 8:30 p.m.   CONSULTATIONS:  1. [facility].  2. Delia Heady, M.D.   PROCEDURE PERFORMED:  1. MRI of the brain without contrast on February 06, 2007.  The  results      revealed a suboptimal study without any gross evidence of acute      ischemia.  Mild thickening of mucosa in the ethmoid and the      maxillary sinuses.  2. Lumbar puncture performed by the interventional radiologist on      February 07, 2007.  3. EEG performed on February 06, 2007.  The results revealed severe      diffuse generalized slowing consistent with encephalopathy.      However, would consider subclinical seizure activity if the      clinical setting is appropriate.  4. EEG repeated on February 09, 2007.  The results revealed diffuse      slowing and FIRDA.   HISTORY OF PRESENT ILLNESS:  The patient is a 60 year old woman with a  past medical history significant for bronchitis  and asthma, who  presented to the emergency department on February 05, 2007, with a chief  complaint of shortness of breath, dizziness, jerking like movements,  vertigo, fullness in her ears, and intermittent confusion.  When the  patient was evaluated in the emergency department, she was noted to be  hemodynamically stable and afebrile.  A CT scan of her head revealed no  acute findings.  Her chest x-ray revealed no acute cardiopulmonary  disease.  The patient, however, was admitted for further evaluation and  management.   For additional details, please see the dictated history and physical.   HOSPITAL COURSE:  #1 - ALTERED MENTAL STATUS, DIZZINESS, VERTIGO, AND  MYOCLONIC MOVEMENTS.  The exact etiology of the patient's symptoms were  certainly not clear at the time of the initial hospital assessment.  However, given the complexity of her symptoms, an autoimmune disorder  was of concern.  A number of specific studies were ordered to assess the  patient's complex symptoms.  These studies included an MRI of the brain,  EEG, TSH, ANA, anti-smooth muscle antibody, blood cultures, rheumatoid  factor, ANA, cardiac enzymes, and subsequently a lumbar puncture.  The  MRI of the brain revealed no  acute ischemic event.  Cardiac enzymes were  within normal limits.  Her sed rate was marginally elevated at 30.  Her  TSH was low at 0.206.  Because of the low TSH, free T4 and T3 were  ordered.  The patient's free T4 was within normal limits at 1.03 and her  T3 was only marginally low at 1.7.  Thyroid antibodies were within  normal limits.  Her rheumatoid factor was less than 20.  Her ANA was  negative.  An urine drug screen was ordered as well and was completely  negative.  Compliment component was 12, within normal limits.  HLA-B27  was negative.   The patient underwent a lumbar puncture per the interventional  radiologist on February 07, 2007.  The cell count of the cerebrospinal  fluid revealed too few to count WBCs and 93 RBCs.  The cerebrospinal  fluid glucose was elevated at 97 and the total protein of the  cerebrospinal fluid was within normal limits at 43. The CSF VDRL was  nonreactive. The CSF culture remained negative during the hospital  course.   Additional studies were ordered including herpes simplex virus IgG which  was actually elevated at 3.57; IgM was not performed.  Cryptococcal  antigen was negative.  The smooth muscle antibody was negative.   The patient underwent an EEG to rule out an underlying seizure disorder.  The EEG revealed encephalopathy versus subclinical seizure.  The patient  was started empirically on Depakote.  Subsequently, neurologist Dr.  Pearlean Brownie was consulted.  Dr. Pearlean Brownie evaluated the patient on February 08, 2007, following the revelation that the patient had chronic respiratory  failure.  Dr. Pearlean Brownie recommended discontinuing the Depakote as he felt  that the patient did not have a seizure disorder.  In addition, he  believed that the patient's neurological presentation was secondary to  chronic hypercarbia and hypoxemia.  Once these conditions were treated  adequately, he expected the patient to improve.   The patient was noted to be very short  of breath.  A D-dimer was ordered  and was within normal limits at 0.30.  An ABG was ordered on 2 liters of  oxygen and results were significant for a pH of 7.29, pCO2 of 61.6, and  a pO2 of 113.  The patient was therefore started on BiPAP and  pulmonologist Dr. Sherene Sires was consulted.   Following the treatment of the patient's respiratory failure, all of her  neurological symptoms completely resolved.  Of note, a follow-up EEG was  performed on February 09, 2007, and it did not reveal evidence of  epileptiform activity.   #2 - CHRONIC HYPERCAPNIC/HYPOXIC RESPIRATORY FAILURE THOUGHT TO BE  SECONDARY TO A COMBINATION OF OBESITY HYPOVENTILATION SYNDROME, COPD,  AND POSSIBLY OBSTRUCTIVE SLEEP APNEA.  The patient was evaluated by  [facility].  Dr. Sherene Sires evaluated the patient and felt that the  patient probably had underlying obesity hypoventilation syndrome and  possibly obstructive sleep apnea.  He made changes in the nebulizer  therapy and administered BiPAP.  The patient had several ABGs ordered  throughout the hospital course.  The results of the ABG were significant  for a pCO2 that ranged from 55 to 65.  It was also noted that the  patient was hypoxic at rest and particularly with activity.  Her O2  saturations generally ranged from 88-90% on room air.  However, with  activity, her oxygen saturations fell to 80-85.  Over the course of the  hospitalization, the patient did improve symptomatically, although she  appears to be chronically short of breath.  Dr. Sherene Sires recommended sending  the patient home on nocturnal BiPAP with the readings of 6/12.  He also  recommended Symbicort, Spiriva and albuterol inhalers.  Of note, the  patient was started on a trial of intravenous steroids for treatment of  a potential autoimmune disorder.  However, the Solu-Medrol was tapered  off quickly and she was started on prednisone.  The steroids may have  helped the patient's lung/pulmonary symptoms.   Per the final assessment  of pulmonologist Dr. Sherene Sires, the patient probably has obesity  hypoventilation syndrome, COPD, and possibly obstructive sleep apnea.  A  sleep study has been scheduled for February 14, 2007 at 8:30 p.m..  The  patient was also prescribed BiPAP and oxygen therapy for home.   #3 - MORBID OBESITY.  The registered dietician and the registered nurse  were both consulted to provide the patient with information and  education regarding weight loss and an appropriate diet to facilitate  weight loss.  The patient appeared to be receptive.   #4 - DECONDITIONING.  The physical and the occupational therapists both  evaluated the patient.  They felt that she would benefit from home  health physical therapy, home health occupational therapy, and a  registered nurse's assessment.  These therapies were arranged prior to  hospital discharge.  A walker and a three-in-one commode were ordered as  well.      Elliot Cousin, M.D.  Electronically Signed     DF/MEDQ  D:  02/10/2007  T:  02/12/2007  Job:  [id number]   cc:   Coralyn Helling, MD  [address]  [place], [place] [address]   Lorelle Formosa, M.D.  Fax: [phone]

## 2010-08-18 NOTE — Consult Note (Signed)
[name], [name]                 ACCOUNT NO.:  [id number]   MEDICAL RECORD NO.:  [id number]          PATIENT TYPE:  INP   LOCATION:  1231                         FACILITY:  [facility]   PHYSICIAN:  Pramod P. Pearlean Brownie, MD    DATE OF BIRTH:  [birth year]   DATE OF CONSULTATION:  DATE OF DISCHARGE:                                 CONSULTATION   REASON FOR CONSULTATION:  Altered mental status, possible seizures.   HISTORY OF PRESENT ILLNESS:  [name] is a 60 year old African American  lady who was admitted three days ago with symptoms of shortness of  breath, dizziness, generalized weakness.  She has also had some altered  mental status and was found to be quite sleepy and drowsy and hard to  arouse.  She was also having involuntary movements, jerking.  She tried  to hold objects; she dropped them from her hands.  Over the last couple  of days, she has been found to have respiratory failure with increased  pCO2 of 70.  She has been treated by pulmonary and seems to have  improved.  This morning, she seems to be wide awake and has noticed no  significant jerking.  There is no definite history of loss of  consciousness, tonic/clonic movements, tongue bite or incontinence.  She  has no known history of prior seizures.   PAST MEDICAL HISTORY:  1. Significant only for bronchitis.  2. Asthma.   MEDICATION ALLERGIES:  CODEINE.   HOME MEDICATIONS:  1. Albuterol.  2. Primetine mist.   SOCIAL HISTORY:  The patient lives with her son.  She has a history of  heavy smoking, two packs per day, but quit four years ago.  Does not  drink alcohol.  She is on disability.  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   PHYSICAL EXAMINATION:  GENERAL:  Reveals an obese, African American,  middle aged lady who is sitting comfortably in a chair.  She is not in  distress at present.  VITAL SIGNS:  She is febrile with heart rate of 110 per minute, regular.  Respiratory rate 16 per minute.  Blood pressure 116/59,  sats 96% on two  liters.  HEENT:  Head is atraumatic.  NECK:  Supple.  PULSES:  Distal pulses well felt.  CARDIAC EXAM:  Regular heart sounds.  LUNGS:  Clear to auscultation.  NEUROLOGIC EXAMINATION:  She is awake, alert and oriented to time, place  and person.  There is no aphasia or __________ dysarthria.  She has  intact __________ .  Recall is diminished __________ .  She follows 3-  step commands.  Eye movements have full range.  There is no nystagmus.  Visual acuity is adequate.  The face is symmetric.  __________ movements  are normal.  Tongue is midline.  MOTOR SYSTEM EXAM:  Revealed no upper extremity drift, symmetric  __________ reflexes.  Coordination, sensation and gait were not tested.   LABORATORY DATA:  Data reviewed.  Spinal tap done yesterday under fl  uroscopy shows too few to count white blood cells with 93 red cells,  __________ .  The glucose is 97.  __________ is negative.  Thyroid  function tests are normal.  Blood gas reveals pH of 7.36, pCO2 was 63.6,  pO2 is 64.6.  Electroencephalogram shows diffuse slowing with delta  activity with questionable sharp activities __________ possible  seizures.  The patient was treated with __________ .   IMPRESSION:  A 60 year old lady with depressed level of consciousness  and involuntary movements which sound like myoclonic jerks in the  setting of respiratory failure.  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  I do not think she has had seizures.  The  electroencephalogram findings maybe nonspecific related to  encephalopathy.   PLAN:  1. I would recommend repeating an EEG to evaluate for seizure      activity.  2. I do not think she needs __________ convulsants.  3. She clearly seems a lot improved today compared to the last few      days and hence will hold on further diagnostic and neurological      testing.  4. Continue treatment for her hypoxic/hypercarbic respiratory failure      as per  pulmonary/critical care.   Thank you for the referral.           ______________________________  Sunny Schlein. Pearlean Brownie, MD     PPS/MEDQ  D:  02/08/2007  T:  02/09/2007  Job:  [id number]

## 2010-08-18 NOTE — H&P (Signed)
[name], [name]                 ACCOUNT NO.:  [id number]   MEDICAL RECORD NO.:  [id number]          PATIENT TYPE:  INP   LOCATION:  1231                         FACILITY:  [facility]   PHYSICIAN:  Hillery Aldo, M.D.   DATE OF BIRTH:  1950-05-11   DATE OF ADMISSION:  02/05/2007  DATE OF DISCHARGE:                              HISTORY & PHYSICAL   PRIMARY CARE PHYSICIAN:  Lorelle Formosa, M.D.   CHIEF COMPLAINT:  Shortness of breath, dizziness for 3 days.   HISTORY OF PRESENT ILLNESS:  The patient is a 60 year old female who  comes in with 2-week history of intermittent lightheadedness and  dizziness.  She denies specifically any passing out spells but has had a  fall related to generalized weakness.  She has had intermittent  headaches, intermittent diplopia, and intermittent shortness of breath  associated with cough which is sometimes productive.  She denies any  chest pain.  She denies any fever or chills.  She has not had any sick  contacts.  She denies any focal neurological deficits or paresthesias.  For the past 3 days, she has become increasingly weak and has developed  these jerking-like movements.  She also reports problems with vertigo  and a sensation of fullness in her ears.  There has been some hearing  loss.  Interestingly, she was evaluated and diagnosed with vertigo  approximately 3 weeks ago by an ER physician.   PAST MEDICAL HISTORY:  1. Bronchitis.  2. Recently diagnosed with vertigo.  3. Asthma.   FAMILY HISTORY:  The patient's mother died at age 86 from complications  of childbirth.  The patient's father died in his 86s from emphysema.  She has one brother who died of complications of diabetes.  Another  brother died of pneumonia.  One sister died with lung cancer.  She has  three living sisters, one who is diabetic.  She has one living brother  who is healthy.   SOCIAL HISTORY:  The patient is single and lives with her son.  She has  a history of  heavy smoking, up to two packs per day, quit 4 years ago.  She denies any alcohol or illicit drug use.  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   ALLERGIES:  CODEINE CAUSES A RASH.   MEDICATIONS:  1. Albuterol 2 puffs p.r.n.  2. Primatene Mist.   REVIEW OF SYSTEMS:  Comprehensive 14-point review of systems is  otherwise negative, with the exception of the elements as noted in the  HPI above.   PHYSICAL EXAMINATION:  VITAL SIGNS:  Temperature 98.3, pulse 105,  respirations 18, blood pressure 110/73, O2 saturation 92% on 3 liters.  GENERAL:  This is an obese female who appears disoriented and weak.  HEENT:  Normocephalic, atraumatic.  PERRL.  EOMI.  Visual fields are  full.  There appears to be some exophthalmos.  Oropharynx is clear.  Tongue is midline.  TMs appear normal, no purulent exudate behind the  TMs.  NECK:  Supple, no thyromegaly, no lymphadenopathy, no jugular venous  distention.  CHEST:  Diminished breath sounds bilaterally.  HEART:  Tachycardiac rate, regular rhythm.  No murmurs, rubs, or  gallops.  ABDOMEN:  Soft, nontender, nondistended with normoactive bowel sounds.  EXTREMITIES:  No clubbing, edema, or cyanosis.  SKIN:  Warm and dry.  No rashes.  NEUROLOGIC:  The patient is somewhat disoriented and lacks focus.  Cranial nerves II-XII are grossly intact.  She has generalized weakness  in all muscle groups.  She has jerking movements and is unable to  perform the Romberg test due to weakness.   DATA REVIEW:  CT scanning of the head shows no acute findings.   Chest x-ray shows no active cardiopulmonary disease.   12-lead EKG shows sinus tachycardia with nonspecific ST and T wave  abnormalities.  She does have inverted T waves in leads V4, V5, and V6.   LABORATORY DATA:  BNP is less than 30.  D-dimer is 0.30.  Sodium is 143,  potassium 4.3, chloride 98, bicarb 32, BUN 11, creatinine 0.86, glucose  91.  White blood cell count is 11.6,  hemoglobin 15, hematocrit 46.9,  platelets 370.  PT is 14.5.  Urinalysis is negative for nitrites with a  small amount of leukocyte esterase.  Microscopy shows 3-6 red blood  cells, 11-20 white blood cells, and rare bacteria.   ASSESSMENT AND PLAN:  1. Altered mental status associated with generalized weakness and      hearing loss.  The patient's constellation of symptoms is certainly      concerning for an autoimmune process.  Specifically, autoimmune      inner ear disease is a possibility.  This has been associated with      antithyroid antibodies.  At this point, we will admit her to the      stepdown unit and monitor her closely.  Will check a thyroid-      stimulating hormone and a free T4.  If these are abnormal, I will      obtain antithyroid antibodies.  Will perform an MRI and MRA of her      brain to rule out white matter disease and cerebrovascular      accident.  We will also do brain stem auditory evoked responses.      Unfortunately, there is no specific disease to diagnosis autoimmune      inner ear disease.  However, some general tests can help point in      that general direction such as obtaining a sedimentation rate and a      C-reactive protein level.  We will also check an antinuclear      antibody, rheumatoid factor, complement C1Q, and smooth muscle      antibodies as well as HLA-B27.  Given her profound weakness, I will      check a myoglobin and cycle cardiac enzymes for the creatine kinase      and to rule out any concurrent cardiac abnormalities given her      abnormal electrocardiogram.  She may have hypercarbia and      concurrent carbon dioxide narcosis so I will check an ABG to      evaluate her oxygenation and ventilation status.  I will also check      an EEG to rule out subclinical seizure activity given her jerking      movements, although she appears to be fully conscious during these      events.  2. Shortness of breath.  The patient has known  asthma.  She does not  appear to be wheezing, but her breath sounds are markedly      diminished.  She becomes hypoxic off supplemental oxygen.  Will      obtain an arterial blood gas and start her on routine nebulized      bronchodilator therapy.  3. Abnormal electrocardiogram/tachycardia.  The patient denies any      chest pain.  Nevertheless, given her abnormalities on 12-lead      electrocardiogram, will cycle cardiac enzymes every 8 hours x3 and      empirically put      her on low-dose aspirin therapy.  4. Prophylaxis.  Initiate gastrointestinal prophylaxis with Protonix      and deep venous thrombosis prophylaxis with Lovenox.      Hillery Aldo, M.D.  Electronically Signed     CR/MEDQ  D:  02/05/2007  T:  02/06/2007  Job:  [id number]   cc:   Lorelle Formosa, M.D.  Fax: [phone]

## 2010-08-18 NOTE — Procedures (Signed)
EEG NUMBER:  [id number]   This is a 60 year old woman who was admitted with a 2-week history of  lightheadedness and dizziness, becoming increasingly weak with some  jerking like movements.  Medications include aspirin, Protonix,  Atrovent, Xopenex, Lovenox and Zofran.  Other history is not available.   This is a portable 17-channel EEG with 1 channel devoted to EKG  utilizing international 10/20 lead placement system.  The patient was  described clinically as being awake and confused.  Electrographically,  her state of consciousness was not easy to detect.  The study consisted  of an admixture of alpha, theta and delta activity, with delta and theta  activity predominating, consistent with diffuse slowing.  No  interhemispheric asymmetry is identified and no definite epileptiform  discharges are seen, although frequent delta activity is seen and is not  rhythmic enough to appear to be PLEDs or seizure discharges.  This is  all of moderately high amplitude. Activation procedures were not  performed and the EKG monitor reveals relatively regular rhythm with a  rate of 96 beats per minute.   CONCLUSION:  Abnormal electroencephalogram demonstrating high amplitude  diffuse delta activity which when viewed at lower sensitivities does not  have an epileptiform contour, although at lower sensitivities is very  high amplitude.  No clinical activity is described during the  electroencephalogram as it is ongoing, although the clinical description  of the patient includes jerking like movements.  Most likely this  reflects a severe diffuse generalized slowing consistent with  encephalopathy, however, would consider subclinical seizure activity if  the clinical setting is appropriate.  Clinical correlation is  recommended.      Catherine A. Orlin Hilding, M.D.  Electronically Signed     ZOX:WRUE  D:  02/06/2007 17:04:02  T:  02/07/2007 14:32:06  Job #:  [id number]

## 2010-08-18 NOTE — Discharge Summary (Signed)
NAMESENOVIA, GAUER                 ACCOUNT NO.:  [id number]   MEDICAL RECORD NO.:  [id number]          PATIENT TYPE:  INP   LOCATION:  1428                         [facility]AC[facility]   PHYSICIAN:  Altha Harm, MDDATE OF BIRTH:  [birth year]   DATE OF ADMISSION:  04/23/2007  DATE OF DISCHARGE:  04/29/2007                               DISCHARGE SUMMARY   DISCHARGE DISPOSITION:  Home with home health care nursing, physical  therapy and occupational therapy.   FINAL DISCHARGE DIAGNOSES:  1. Urinary tract infection.  2. Trichomoniasis.  3. Bilateral lower extremity weakness, resolving.  4. Chronic hypoxic failure, likely multifactorial.  5. Obstructive sleep apnea.  6. Obesity hypoventilation syndrome.  7. Morbid obesity.  8. History of atypical chest pain in the past.  9. History of vertigo.   DISCHARGE MEDICATIONS:  1. Flagyl 500 mg p.o. b.i.d. x2 days.  2. Protonix 40 mg p.o. daily.  3. Albuterol MDI or nebulizers q.4h. p.r.n.  4. Oxygen 2 L nasal cannula around-the-clock.  5. Lasix 20 mg p.o. daily.   CONSULTANTS:  1. Pulmonology.  2. Dr. Riley Kill, physiatry.   PROCEDURES:  None.   DIAGNOSTIC STUDIES:  1. A CT head without contrast done on admission on April 22, 2007,      which shows a negative head CT.  2. Chest x-ray two-view done on admission on January 17 that shows no      active cardiopulmonary disease.  A stable linear density in the      medial right lower lobe possibly related to pleural line.      Hyperinflation.  Suspect underlying emphysema.  3. X-ray of the right ankle completed shows no acute bony      abnormalities of the right ankle.  4. MR of the lumbar spine done on January 22.  Findings were      incomplete study which is limited by the patient's size and motion.      It is negative for fracture.  There is lumbar disk degeneration and      spurring at the lower three levels.  5. Chest x-ray portable one-view shows cardiomegaly and suggestion  of      mild to moderate vascular congestion.   CHIEF COMPLAINT:  Weakness.   HISTORY OF PRESENT ILLNESS:  Please see the H&P dictated by Dr. Lovell Sheehan  for details of the HPI.  However, in short, this patient is a 60-year-  old who presented to emergency complaining of an acute onset of severe  weakness, difficulty in walking and standing.   HOSPITAL COURSE:  1. The patient was admitted to the hospital with, as noted above,      bilateral lower extremity weakness.  Studies were done as indicated      including an MRI that shows the findings as stated above.  The      patient was started on physical therapy who evaluated the patient      and recommended the patient be evaluated for inpatient rehab.  The      patient was also seen in consultation by Dr. Riley Kill, physiatry,  for      any input regarding need for further studies or any orthotic      devices for this patient.  His recommendation is for the patient to      have neoprene braces to both knees upon standing and walking.  The      patient continued physical therapy and improved in her strength to      the level where she was able to ambulate with a rolling walker for      short distances.  It was recommended to the patient that she should      go on to a skilled nursing facility for further inpatient rehab.      However, the patient has declined that offer and wants to go home      with inpatient therapies within the home.  Thus, the patient is      being discharged to her home with inpatient physical therapy,      occupational therapy and nursing.  It has been discussed with the      patient her risk of falling as she still is not very steady on her      feet.  However, the patient is competent and has the capacity for      decision-making and has made the decision that she wants to go home      with the above-stated services.  2. Urinary tract infection.  The patient was diagnosed with a urinary      tract infection prehospital  and was prescribed ciprofloxacin which      she had not started yet.  The patient was treated with a full      course of 7 days of ciprofloxacin while hospitalized.  3. Vaginal trichomoniasis.  The patient was also diagnosed prehospital      with trichomoniasis and was prescribed Flagyl, again that she had      not commenced taking.  The patient was treated with Flagyl and      continues for an additional 2 days of Flagyl to complete her 7 days      of treatment.  4. Chronic hypoxic syndrome.  This is felt to be multifactorial, owing      to the patient's obstructive sleep apnea and obesity      hypoventilation syndrome.  The patient is on BiPAP at home and she      is to continue her BiPAP.  The patient also was found now to need      oxygen even at rest.  Thus, the patient is being prescribed 2 L of      oxygen nasal cannula to be used around-the-clock.  5. Obstructive sleep apnea.  The patient has a provisional diagnosis      of obstructive sleep apnea; however, has not had a formal      polysomnogram.  The patient was seen by pulmonology and the      recommendation is the patient have a split-night polysomnogram      BiPAP titration study done upon discharge.  The information has      been given to the patient.  The order for the BiPAP has been      generated and the patient is to call to make the appointment for      her polysomnogram.  Please note that the patient had a      polysomnogram scheduled before but did not show up as she had no  transportation to do this.  Social work has given the patient      information and the patient is to make arrangements with the      Medicare transportation service to transport her to her      polysomnogram once the appointment has been made.  All of this has      been discussed with the patient and the patient acknowledges      understanding of the above.   The patient's condition at the time of discharge:  Her vital signs are  stable,  she is afebrile.  Today we do not have a blood pressure as the  patient is refusing vital signs being taken beyond just her temperature  and heart rate.  The patient is saturating 98% on 2 L nasal oxygen.  She  is in no acute distress.  Her lungs are clear to auscultation.  Her  abdomen is obese, soft, nontender, nondistended.  No masses, no  hepatosplenomegaly.  She has got a normal S1 and  S2.  No murmurs, rubs, or gallops are noted.  The patient has had normal  bowel movements and she is tolerating her diet without difficulty.  The  patient is being discharged home and her dietary restrictions are a low-  sodium diet.  Physical restrictions are activity as tolerated.      Altha Harm, MD  Electronically Signed     MAM/MEDQ  D:  04/29/2007  T:  04/29/2007  Job:  [id number]

## 2010-08-18 NOTE — H&P (Signed)
NAMEJILLANA, [name]                 ACCOUNT NO.:  [id number]   MEDICAL RECORD NO.:  [id number]          PATIENT TYPE:  INP   LOCATION:  1428                         FACILITY:  [facility]   PHYSICIAN:  Della Goo, M.D. DATE OF BIRTH:  [birth year]   DATE OF ADMISSION:  04/23/2007  DATE OF DISCHARGE:                              HISTORY & PHYSICAL   PRIMARY CARE PHYSICIAN:  Lorelle Formosa, M.D.   CHIEF COMPLAINTS:  Weakness.   HISTORY OF PRESENT ILLNESS:  This is a 60 year old female presenting to  the emergency department with complaints of severe weakness, difficulty  walking and standing.  She reports she could not get up, and emergency  medical services was called.  She does report having weakness in her  knees at times.  She also has complaints of having headaches, dizziness  since November and was evaluated at that time by neurology and had EEG  studies performed secondary to complaints of spastic jerking.  She  complains today as well of having these jerking episodes.  When she was  hospitalized before, the analysis was that the jerking movements were  possibly secondary to hypercarbia and hypoxemia from her  obesity=hypoventilation syndrome.  She is on CPAP therapy at night and  oxygen as needed.  Patient also describes having chest discomfort off  and on.   The patient was evaluated in the emergency department 1 day before,  evaluated and found to have a urinary tract infection and also  Trichomonas in her urine as well.  She was placed on antibiotic therapy  of Cipro and Flagyl.  She picked up her medication today and reports  taking two of the Cipro tablets by accident and one of the Flagyl  tablets.  She denies having any nausea, vomiting, diarrhea.   PAST MEDICAL HISTORY:  1. Asthma.  2. Obesity-hypoventilation syndrome and obstructive sleep apnea.  3. Morbid obesity.  4. Vertigo secondary to the inner ear infection.  5. Atypical Chest Pain   MEDICATIONS AT  THIS TIME:  Flagyl and Cipro.   ALLERGIES:  No known drug allergies.  She has an intolerance to codeine,  which causes nausea and vomiting.   SOCIAL HISTORY:  The patient is a nonsmoker and nondrinker at this at  this time.   FAMILY HISTORY:  Positive diabetes mellitus in a brother and sister and  lung cancer in a sister, who is deceased.   REVIEW OF SYSTEMS:  Pertinents are mentioned above.   PHYSICAL EXAMINATION FINDINGS:  This is a morbidly obese 60 year old  female in no discomfort or acute distress.  Her vital signs are stable.  She is afebrile with a temperature of 98.2,  blood pressure of 122/55, heart rate 95, respirations 18, O2 saturations  96-98%.  HEENT:  Normocephalic and atraumatic.  Pupils equally round, reactive to  light.  Extraocular muscles are intact.  Funduscopic benign.  There is  no scleral icterus.  Tympanic membranes are clear bilaterally.  Oropharynx is clear.  NECK:  Supple with full range of motion.  No thyromegaly, adenopathy or  jugulovenous distention.  CARDIOVASCULAR:  Regular  rate and rhythm.  LUNGS:  Clear to auscultation bilaterally.  ABDOMEN:  Positive bowel sounds, soft, nontender, nondistended.  EXTREMITIES:  Without cyanosis, clubbing or edema.  NEUROLOGIC:  Alert and oriented x3.  There are no focal deficits.   LABORATORY STUDIES:  White blood cell count 11.9, hemoglobin 12.8,  hematocrit 38.1, platelets 312, neutrophils 78% lymphocytes 17%.  Sodium  143, potassium 4.3, chloride 102, CO2 31, BUN 11, creatinine 0.79 and  glucose 101.  Urine culture negative at 24 hours.   ASSESSMENT:  [name] being admitted with:   1. Weakness.  2. Urinary tract infection.  3. Asthma history.  4. Obesity-hypoventilation syndrome/obstructive sleep apnea.  5. Allergic rhinitis and allergies.   PLAN:  The patient will be admitted to a telemetry area and cardiac  enzymes will be performed.  The patient will continue on her oral  antibiotic  therapy.  IV fluids have been ordered for rehydration therapy  and orthostatic vital signs will be checked.  DVT and GI prophylaxis has  been ordered and nebulizer treatments have been ordered as well as  needed.      Della Goo, M.D.  Electronically Signed     HJ/MEDQ  D:  04/24/2007  T:  04/24/2007  Job:  [id number]   cc:   Lorelle Formosa, M.D.  Fax: [phone]

## 2010-08-18 NOTE — H&P (Signed)
NAME:  [name], [name]                 ACCOUNT NO.:  [id number]   MEDICAL RECORD NO.:  [id number]          PATIENT TYPE:  INP   LOCATION:  5506                         FACILITY:  [facility]   PHYSICIAN:  Della Goo, M.D. DATE OF BIRTH:  March 12, 1951   DATE OF ADMISSION:  07/07/2007  DATE OF DISCHARGE:                              HISTORY & PHYSICAL   CHIEF COMPLAINT:  Shortness of breath   HISTORY OF PRESENT ILLNESS:  This is a 60 year old female presenting to  the emergency department secondary to worsening shortness of breath that  started in the afternoon at about 2:00 p.m.  She reports having chest  tightness, shortness of breath and wheezing.  She denies having any  cough, fevers, chills.   PAST MEDICAL HISTORY:  Significant for asthma, obesity hypoventilation  syndrome and obstructive sleep apnea, morbid obesity, atypical chest  pain, and previous problems with vertigo.   CURRENT MEDICATIONS:  The patient reports being on an Advair inhaler.  Her regular medications will need to be verified further. She reports  being on a blood pressure pill and potassium and oxygen and CPAP at  night.   ALLERGIES:  CODEINE which cause GI distress/nausea, vomiting.   SOCIAL HISTORY:  The patient is a nonsmoker, nondrinker.   FAMILY HISTORY:  Positive for diabetes mellitus in a brother and a  sister and lung cancer in a sister who is deceased   REVIEW OF SYSTEMS:  Pertinent are mentioned above.  The patient denies  having any nausea, vomiting, diarrhea, fevers, chills.  She also denies  having any chest pain.   PHYSICAL EXAMINATION:  This is a 60 year old morbidly obese female in  discomfort and mild distress.  VITAL SIGNS:  Temperature 98.0, blood pressure 127/83, heart rate 132,  respirations 16, O2 saturations 96-100% on 2 liters nasal cannula  oxygen.  HEENT:  Normocephalic, atraumatic.  Pupils equally round and reactive to  light.  Extraocular muscles are intact, funduscopic benign.  Oropharynx  is clear.  NECK:  Supple, full range of motion.  No thyromegaly, adenopathy,  jugular venous distention.  CARDIOVASCULAR:  Tachycardiac rate and rhythm.  No murmurs, gallops or  rubs.  LUNGS:  Decreased breath sounds bilaterally.  No rales, rhonchi or  wheezes appreciated.  ABDOMEN:  Obese, positive bowel sounds, soft, nontender, nondistended.  EXTREMITIES:  Without cyanosis, clubbing or edema.   LABORATORY STUDIES:  White blood cell count 17.5, hemoglobin 13.7,  hematocrit 41.8, platelets 320, neutrophils 68% lymphocytes 22%.  Sodium  137, potassium 4.7, chloride 100, bicarb 34, BUN 19, creatinine 1.0,  glucose 166.  Protime 12.5, INR 0.9, PTT 32. Urinalysis negative.  Chest  x-ray reveals borderline heart size with pulmonary vascular congestion.  BNP less than 30.   EKG reveals a sinus tachycardia without acute ST-segment changes.   ASSESSMENT:  [name] being admitted with:  1. Asthma exacerbation.  2. Hypertension.  3. Sinus tachycardia.  4. Morbid obesity.  5. Obstructive sleep apnea.   PLAN:  The patient will be admitted to a telemetry area.  Cardiac  enzymes will also be started and performed.  The patient will be placed  on supplemental oxygen and an IV steroid taper has been ordered along  with nebulizer treatments with Xopenex and Atrovent.  The patient will  continue on her home medications once they are verified and the patient  will also be placed on BiPAP therapy q.h.s.and PRN.  DVT and GI  prophylaxis have also been ordered.      Della Goo, M.D.  Electronically Signed     HJ/MEDQ  D:  07/08/2007  T:  07/08/2007  Job:  [id number]

## 2010-08-18 NOTE — Discharge Summary (Signed)
[name], [name]                 ACCOUNT NO.:  [id number]   MEDICAL RECORD NO.:  [id number]          PATIENT TYPE:  INP   LOCATION:  5522                         FACILITY:  [facility]   PHYSICIAN:  Elliot Cousin, M.D.    DATE OF BIRTH:  09/27/1950   DATE OF ADMISSION:  07/07/2007  DATE OF DISCHARGE:  07/11/2007                               DISCHARGE SUMMARY   DISCHARGE DIAGNOSES:  1. Acute dyspnea secondary to mild asthma exacerbation, obesity      hypoventilation syndrome, and diastolic congestive heart failure.  2. Chronic hypoxic respiratory failure secondary to obesity      hypoventilation syndrome and obstructive sleep apnea.  The patient      is treated chronically with oxygen and CPAP at night.  3. Hyperglycemia secondary to steroids.  4. Leukocytosis secondary to steroids.  5. Mildly elevated troponin I, thought to be secondary to asthma and      congestive heart failure.   DISCHARGE MEDICATIONS:  1. Prednisone taper, taper over the next 6 days.  2. Lasix 20 mg take 2 tablets daily.  3. Albuterol MDI 2 puffs every 4 hours as needed.  4. Oxygen 2 L per minute.  5. CPAP nightly.  6. Advair 250/50 one inhalation b.i.d.  7. Potassium chloride 10 mEq 2 tablets daily.  8. Mobic 15 mg daily.  9. Prilosec 20 mg daily.  10.Aspirin 81 mg daily.   DISCHARGE DISPOSITION:  The patient is being discharged to home in  improved and stable condition on July 11, 2007.  She was advised to  follow up with her primary care physician at the [facility].   CONSULTATIONS:  None.   PROCEDURES PERFORMED:  1. Chest x-ray on July 11, 2007.  The results revealed improving      bibasilar aeration with mild patchy atelectasis remaining.  2. Chest x-ray on July 07, 2007.  The results revealed borderline      heart size with pulmonary vascular congestion, question edema at      the bases.   HISTORY OF PRESENT ILLNESS:  The patient is a 59 year old woman with a  past medical history  significant for obesity, obstructive sleep apnea,  and obesity hypoventilation syndrome, who presented to the emergency  department on July 07, 2007, with a chief complaint of shortness of  breath.  When the patient was evaluated in the emergency department, her  chest x-ray revealed findings suggestive of mild pulmonary edema.  Her  white blood cell count was elevated at 17.5 ( the patient received  steroids in the emergency department prior to the lab draw).  She was  completely afebrile.  The patient was therefore admitted for further  evaluation and management.  For additional details, please see the  dictated history and physical.   HOSPITAL COURSE:  1. ACUTE DYSPNEA SECONDARY TO MILD ASTHMA EXACERBATION, OBSTRUCTIVE      SLEEP APNEA, OBESITY HYPOVENTILATION SYNDROME, AND DIASTOLIC      CONGESTIVE HEART FAILURE.  The patient was started empirically on      gentle IV fluids and intravenous Solu-Medrol.  Oxygen therapy  was      continued as was CPAP therapy.  She was also started on      azithromycin empirically although the patient demonstrated no signs      or symptoms consistent with infection.  The IV fluids were      eventually discontinued and the patient was started on intravenous      Lasix for findings suggestive of mild pulmonary edema.  The patient      has a known history of diastolic dysfunction.  Her left ventricular      ejection fraction was estimated to be 55% per the echocardiogram in      January of 2009.  There were features consistent with diastolic      dysfunction on the echo in January 2009 as well.  The  patient's      EKG at the time of the initial hospital assessment revealed sinus      tachycardia with a heart rate of 129 beats per minute and      nonspecific ST abnormalities.  Cardiac enzymes were ordered and      revealed a mildly elevated troponin I of 0.10, however, her CK was      within normal limits.  The patient had no complaints of chest pain       during the hospital course.  More than likely, the elevation was      secondary to diastolic heart failure and asthma.  Her BNP at the      time of the initial hospital assessment was less than 30 and it was      repeated again today at less than 30.  It is possible that the BNP      is falsely low because of the patient's morbid obesity.   Over the course of the hospitalization, the patient diuresed well.  Today, she is symptomatically improved.  The Solu-Medrol was tapered off  and the patient was started on a prednisone taper.  She will continue on  prednisone over the next few days as well as Advair and albuterol MDI.  The patient had been treated with 20 mg of Lasix prior to the hospital  admission; however, she will be discharged to home on 40 mg of Lasix  daily.   1. LEUKOCYTOSIS.  The patient's white blood cell count was elevated at      the time of the initial hospital assessment.  Apparently, the      patient received steroids in the emergency department.  Her white      blood cell count was 17.5 initially.  Over the course of the      hospitalization, the patient remained afebrile, however, her white      blood cell count did increase to 18.7.  The patient did receive      large doses of Solu-Medrol initially.  Therefore, the leukocytosis      was felt to be secondary to steroid therapy.   1. HYPERGLYCEMIA.  The patient's venous glucose was 165 at the time of      the initial hospital assessment.  She gives no history of diabetes      mellitus.  In review of the previous hospitalization, her venous      glucose was in the 100-120 range then.  The patient was started on      sliding scale NovoLog and Lantus while she was on steroids.  As the      steroids were tapered downward, the patient's  capillary blood      glucose improved.  As of today, her capillary blood glucose is 136.      Elliot Cousin, M.D.  Electronically Signed     DF/MEDQ  D:  07/11/2007  T:   07/12/2007  Job:  [id number]

## 2010-08-18 NOTE — Procedures (Signed)
NAME:  [name], [name]                 ACCOUNT NO.:  [id number]   MEDICAL RECORD NO.:  [id number]          PATIENT TYPE:  OUT   LOCATION:  SLEEP CENTER                 FACILITY:  [facility]   PHYSICIAN:  Clinton D. Maple Hudson, MD, FCCP, FACPDATE OF BIRTH:  Dec 12, 1950   DATE OF STUDY:                            NOCTURNAL POLYSOMNOGRAM   REFERRING PHYSICIAN:  Lehman Prom, MD   INDICATION FOR STUDY:  Insomnia with sleep apnea.   EPWORTH SLEEPINESS SCORE:  4/24.  BMI 41.8, weight 251 pounds, height 65  inches, neck 15 inches.   HOME MEDICATIONS:  Charted and reviewed.  The patient has been using a  bilevel machine at home and was anxious about trying to sleep without  it.   SLEEP ARCHITECTURE:  Total sleep time 208.5 minutes with sleep  efficiency 54.3%.  Stage I was 0.2%.  Stage II 78.4%.  Stage III 21.3%.  REM absent.  Sleep latency 29.5 minutes.  Awake after sleep onset 145.5  minutes.  Arousal index 8.1.  Advair was taken at bedtime.  Sleep  architecture was significant for repeated sustained intervals of  wakefulness through the night and for absence of REM.   RESPIRATORY DATA:  Apnea/hypopnea index (AHI) 1.2 per hour which is  normal.  Four events were scored, all hypopneas recorded while she was  not sleeping on her back.  She slept only on her right side.   OXYGEN DATA:  Moderate snoring.  Supplemental oxygen was worn through  the night by nasal prongs with oxygen desaturation to a nadir of 80%.  Mean oxygen saturation of 96.5% on supplemental oxygen at 2 liters per  minute.   CARDIAC DATA:  Sinus rhythm with occasional PVC.   MOVEMENT-PARASOMNIA:  A total of 11 events were counted, none associated  with sleep arousal.   IMPRESSIONS-RECOMMENDATIONS:  1. This patient expressed anxiety and claustrophobia concerns wanting      the TV left on, door left open and wanting the fan blowing directly      on her.  See technician notes for details of technician efforts to      meet  patient's comfort needs.  2. Occasional respiratory events within normal limits, AHI 1.2 per      hour reflecting a total of 4 hyponeas recorded      while sleeping supine.  Moderate snoring without desaturation to a      nadir of 80% while wearing supplemental oxygen at 2 liters per      minute.      Clinton D. Maple Hudson, MD, [facility], FACP  Diplomate, Biomedical engineer of Sleep Medicine  Electronically Signed     CDY/MEDQ  D:  07/01/2007 11:38:16  T:  07/01/2007 13:29:11  Job:  [id number]

## 2010-08-18 NOTE — Procedures (Signed)
EEG NUMBER:  [id number].   HISTORY:  This is a 60 year old, lightheadedness and dizziness, who is  having an EEG done to evaluate for seizure activity.  The patient had an  EEG performed on February 06, 2007 that showed primarily diffuse slowing.  The patient had a repeat EEG for evaluation.   TECHNICAL DESCRIPTION:  Throughout this portable EEG, there is a  posterior derm rhythm of 8 Hz activity at 30 to 40 microvolts.  Background activity is symmetric, mostly comprised of theta and  occasional delta range activity at 25 to 40 microvolts.  There is  occasionally frontal intermittent rhythmic delta activity or FIRDA noted  throughout the background.  No photic stimulation nor hyperventilation  were performed throughout this record.  Throughout this tracing, there  is no evidence of electrographic seizures or interictal discharge  activity.  EKG tracing shows heart rate of approximately 70 beats per  minute.   IMPRESSION:  This routine EEG is abnormal secondary to diffuse slowing  and FIRDA.  Diffuse slowing is suggestive of a toxic metabolic or  primary neuronal disorder.  The presence of FIRDA can also be seen in  deep midline structural lesions or hydrocephalus.  Clinical correlation  is advised.      Bevelyn Buckles. Nash Shearer, M.D.  Electronically Signed     EAV:WUJW  D:  02/09/2007 19:50:34  T:  02/10/2007 12:04:39  Job #:  [id number]

## 2010-12-24 LAB — COMPREHENSIVE METABOLIC PANEL
BUN: 12
CO2: 30
Calcium: 8.9
Chloride: 103
Creatinine, Ser: 0.82
GFR calc non Af Amer: >60
Glucose, Bld: 100 — ABNORMAL HIGH
Total Bilirubin: 0.7

## 2010-12-24 LAB — CBC
HCT: 33.6 — ABNORMAL LOW
HCT: 38.1
HCT: 39.7
MCHC: 33.1
MCV: 94
Platelets: 241
Platelets: 312
RBC: 4.22
RDW: 16.3 — ABNORMAL HIGH
RDW: 16.4 — ABNORMAL HIGH
WBC: 12 — ABNORMAL HIGH

## 2010-12-24 LAB — DIFFERENTIAL
Basophils Absolute: 0
Basophils Absolute: 0.1
Eosinophils Absolute: 0
Eosinophils Relative: 0
Eosinophils Relative: 2
Lymphocytes Relative: 17
Lymphocytes Relative: 23
Lymphs Abs: 2.7
Neutro Abs: 8.2 — ABNORMAL HIGH
Neutrophils Relative %: 68
Neutrophils Relative %: 78 — ABNORMAL HIGH

## 2010-12-24 LAB — CARDIAC PANEL(CRET KIN+CKTOT+MB+TROPI)
CK, MB: 3.9
Relative Index: 2.2
Total CK: 139
Troponin I: 0.03
Troponin I: 0.05

## 2010-12-24 LAB — D-DIMER, QUANTITATIVE: D-Dimer, Quant: 0.35

## 2010-12-24 LAB — BASIC METABOLIC PANEL
BUN: 11
CO2: 30
CO2: 35 — ABNORMAL HIGH
Calcium: 8.7
Chloride: 98
Creatinine, Ser: 0.68
Creatinine, Ser: 0.79
GFR calc Af Amer: >60
GFR calc Af Amer: >60
GFR calc non Af Amer: >60
GFR calc non Af Amer: >60
Glucose, Bld: 101 — ABNORMAL HIGH
Glucose, Bld: 128 — ABNORMAL HIGH
Potassium: 3.4 — ABNORMAL LOW
Potassium: 4.1
Potassium: 4.3
Sodium: 139
Sodium: 139
Sodium: 142

## 2010-12-24 LAB — URINALYSIS, ROUTINE W REFLEX MICROSCOPIC
Ketones, ur: 15 — AB
Leukocytes, UA: NEGATIVE
Nitrite: NEGATIVE
Protein, ur: 30 — AB
Urobilinogen, UA: 0.2
pH: 5.5

## 2010-12-24 LAB — URINE CULTURE: Colony Count: NO GROWTH

## 2010-12-24 LAB — URINE MICROSCOPIC-ADD ON

## 2010-12-24 LAB — POCT CARDIAC MARKERS
CKMB, poc: 5.7
Myoglobin, poc: 206
Operator id: 3206

## 2010-12-24 LAB — BLOOD GAS, ARTERIAL
Bicarbonate: 33.4 — ABNORMAL HIGH
Patient temperature: 98.6
TCO2: 30.4
pH, Arterial: 7.306 — ABNORMAL LOW
pO2, Arterial: 84.1

## 2010-12-24 LAB — TROPONIN I: Troponin I: 0.05

## 2010-12-24 LAB — CK TOTAL AND CKMB (NOT AT ARMC): Relative Index: 2.3

## 2010-12-29 LAB — POCT I-STAT, CHEM 8
BUN: 19
Creatinine, Ser: 1
Hemoglobin: 16 — ABNORMAL HIGH
Potassium: 4.7
Sodium: 137

## 2010-12-29 LAB — PROTIME-INR
INR: 0.9
Prothrombin Time: 12.5

## 2010-12-29 LAB — BASIC METABOLIC PANEL
BUN: 22
BUN: 23
CO2: 26
CO2: 40 — ABNORMAL HIGH
Calcium: 9.5
Chloride: 92 — ABNORMAL LOW
Chloride: 96
Creatinine, Ser: 0.72
GFR calc Af Amer: >60
GFR calc non Af Amer: >60
Glucose, Bld: 103 — ABNORMAL HIGH
Glucose, Bld: 81
Potassium: 4.6
Potassium: 4.7
Sodium: 137
Sodium: 141

## 2010-12-29 LAB — CBC
HCT: 39.7
HCT: 41.1
HCT: 41.8
Hemoglobin: 12.5
Hemoglobin: 13.2
Hemoglobin: 13.7
MCHC: 32.8
MCHC: 33
MCHC: 33.3
MCV: 94.3
MCV: 94.5
MCV: 94.8
MCV: 95.3
Platelets: 294
Platelets: 309
Platelets: 327
RBC: 4.03
RBC: 4.38
RDW: 14.7
WBC: 18.7 — ABNORMAL HIGH
WBC: 21.1 — ABNORMAL HIGH
WBC: 22.8 — ABNORMAL HIGH

## 2010-12-29 LAB — URINALYSIS, ROUTINE W REFLEX MICROSCOPIC
Bilirubin Urine: NEGATIVE
Ketones, ur: NEGATIVE
Nitrite: NEGATIVE
pH: 5

## 2010-12-29 LAB — COMPREHENSIVE METABOLIC PANEL
AST: 24
Albumin: 3.1 — ABNORMAL LOW
Alkaline Phosphatase: 96
BUN: 15
BUN: 15
CO2: 32
Calcium: 9.1
Calcium: 9.6
Creatinine, Ser: 0.61
GFR calc Af Amer: >60
GFR calc non Af Amer: >60
GFR calc non Af Amer: >60
Glucose, Bld: 165 — ABNORMAL HIGH
Total Protein: 7.8

## 2010-12-29 LAB — DIFFERENTIAL
Basophils Relative: 0
Eosinophils Absolute: 0.3
Lymphs Abs: 3.8
Monocytes Absolute: 1.4 — ABNORMAL HIGH
Monocytes Relative: 8

## 2010-12-29 LAB — CARDIAC PANEL(CRET KIN+CKTOT+MB+TROPI)
CK, MB: 13.2 — ABNORMAL HIGH
Relative Index: 12.9 — ABNORMAL HIGH
Total CK: 102
Total CK: 188 — ABNORMAL HIGH
Troponin I: 0.11 — ABNORMAL HIGH

## 2010-12-29 LAB — URINE CULTURE

## 2010-12-29 LAB — B-NATRIURETIC PEPTIDE (CONVERTED LAB): Pro B Natriuretic peptide (BNP): <30

## 2010-12-29 LAB — CK TOTAL AND CKMB (NOT AT ARMC): Relative Index: 9.2 — ABNORMAL HIGH

## 2010-12-29 LAB — TROPONIN I: Troponin I: 0.1 — ABNORMAL HIGH

## 2011-01-12 LAB — BLOOD GAS, ARTERIAL
Acid-Base Excess: 1.4
Acid-Base Excess: 6.4 — ABNORMAL HIGH
Acid-Base Excess: 7.9 — ABNORMAL HIGH
Acid-Base Excess: 9 — ABNORMAL HIGH
Bicarbonate: 34.3 — ABNORMAL HIGH
Bicarbonate: 34.8 — ABNORMAL HIGH
Bicarbonate: 35.4 — ABNORMAL HIGH
Delivery systems: POSITIVE
Delivery systems: POSITIVE
FIO2: 0.25
FIO2: 0.28
FIO2: 0.35
O2 Content: 2
O2 Saturation: 89.4
O2 Saturation: 91.5
O2 Saturation: 92.6
O2 Saturation: 94.8
O2 Saturation: 97.6
PEEP: 6
Patient temperature: 99.2
TCO2: 26.3
TCO2: 30.2
pCO2 arterial: 61.6
pCO2 arterial: 70.5
pH, Arterial: 7.325 — ABNORMAL LOW
pH, Arterial: 7.381
pO2, Arterial: 113 — ABNORMAL HIGH
pO2, Arterial: 53.9 — ABNORMAL LOW
pO2, Arterial: 63.8 — ABNORMAL LOW
pO2, Arterial: 64.6 — ABNORMAL LOW
pO2, Arterial: 66 — ABNORMAL LOW
pO2, Arterial: 77.6 — ABNORMAL LOW

## 2011-01-12 LAB — CK TOTAL AND CKMB (NOT AT ARMC)
CK, MB: 4.9 — ABNORMAL HIGH
Relative Index: 4.9 — ABNORMAL HIGH
Total CK: 101

## 2011-01-12 LAB — COMPREHENSIVE METABOLIC PANEL
ALT: 15
BUN: 9
CO2: 35 — ABNORMAL HIGH
Calcium: 9.2
Creatinine, Ser: 0.71
GFR calc non Af Amer: >60
Glucose, Bld: 106 — ABNORMAL HIGH
Sodium: 145

## 2011-01-12 LAB — CBC
HCT: 39.3
HCT: 43.8
HCT: 46.9 — ABNORMAL HIGH
Hemoglobin: 12.8
Hemoglobin: 13.1
Hemoglobin: 14.2
MCHC: 32.5
MCV: 94.8
MCV: 95
Platelets: 314
Platelets: 370
RBC: 4.61
RDW: 17.2 — ABNORMAL HIGH
RDW: 18.2 — ABNORMAL HIGH
WBC: 11.6 — ABNORMAL HIGH
WBC: 8.5
WBC: 9

## 2011-01-12 LAB — URINE CULTURE: Culture: NO GROWTH

## 2011-01-12 LAB — HEPATIC FUNCTION PANEL
ALT: 13
AST: 18
Albumin: 3 — ABNORMAL LOW
Alkaline Phosphatase: 78
Bilirubin, Direct: <0.1
Total Bilirubin: 0.4

## 2011-01-12 LAB — TROPONIN I: Troponin I: 0.02

## 2011-01-12 LAB — CSF CULTURE W GRAM STAIN

## 2011-01-12 LAB — BASIC METABOLIC PANEL
BUN: 11
BUN: 5 — ABNORMAL LOW
Calcium: 9
Creatinine, Ser: 0.86
GFR calc non Af Amer: >60
GFR calc non Af Amer: >60
GFR calc non Af Amer: >60
Glucose, Bld: 151 — ABNORMAL HIGH
Glucose, Bld: 91
Potassium: 4.3
Potassium: 4.6
Sodium: 140
Sodium: 141

## 2011-01-12 LAB — URINE MICROSCOPIC-ADD ON

## 2011-01-12 LAB — URINALYSIS, ROUTINE W REFLEX MICROSCOPIC
Ketones, ur: 15 — AB
Nitrite: NEGATIVE
Protein, ur: 30 — AB
Urobilinogen, UA: 0.2

## 2011-01-12 LAB — TSH: TSH: 0.206 — ABNORMAL LOW

## 2011-01-12 LAB — DIFFERENTIAL
Basophils Absolute: 0
Eosinophils Absolute: 0.1
Eosinophils Relative: 1
Lymphocytes Relative: 18
Neutrophils Relative %: 77

## 2011-01-12 LAB — AFB CULTURE WITH SMEAR (NOT AT ARMC)

## 2011-01-12 LAB — ANA: Anti Nuclear Antibody(ANA): NEGATIVE

## 2011-01-12 LAB — RENAL FUNCTION PANEL
Albumin: 3 — ABNORMAL LOW
Calcium: 8.3 — ABNORMAL LOW
GFR calc Af Amer: >60
GFR calc non Af Amer: >60
Glucose, Bld: 176 — ABNORMAL HIGH
Phosphorus: 3.2
Sodium: 142

## 2011-01-12 LAB — HSV PCR
HSV 2 , PCR: NOT DETECTED
HSV, PCR: NOT DETECTED

## 2011-01-12 LAB — RHEUMATOID FACTOR: Rhuematoid fact SerPl-aCnc: <20

## 2011-01-12 LAB — RAPID URINE DRUG SCREEN, HOSP PERFORMED: Benzodiazepines: NOT DETECTED

## 2011-01-12 LAB — VIRUS CULTURE: Preliminary Culture: NEGATIVE

## 2011-01-12 LAB — ANTI-SMOOTH MUSCLE ANTIBODY, IGG: F-Actin IgG: <20 U (ref <20)

## 2011-01-12 LAB — CULTURE, BLOOD (ROUTINE X 2)

## 2011-01-12 LAB — B-NATRIURETIC PEPTIDE (CONVERTED LAB): Pro B Natriuretic peptide (BNP): <30

## 2011-01-12 LAB — CSF CELL COUNT WITH DIFFERENTIAL: Tube #: 3

## 2011-01-12 LAB — PROTEIN AND GLUCOSE, CSF
Glucose, CSF: 97 — ABNORMAL HIGH
Total  Protein, CSF: 43

## 2011-01-12 LAB — HEMOGLOBIN A1C
Hgb A1c MFr Bld: 6.2 — ABNORMAL HIGH
Mean Plasma Glucose: 143

## 2011-01-12 LAB — PROTIME-INR: Prothrombin Time: 14.5

## 2011-01-12 LAB — D-DIMER, QUANTITATIVE: D-Dimer, Quant: 0.3

## 2011-01-12 LAB — FUNGUS CULTURE W SMEAR

## 2011-01-12 LAB — PHOSPHORUS: Phosphorus: 3.2

## 2011-01-14 LAB — DIFFERENTIAL
Basophils Relative: 0
Monocytes Relative: 3
Neutro Abs: 9.3 — ABNORMAL HIGH
Neutrophils Relative %: 84 — ABNORMAL HIGH

## 2011-01-14 LAB — COMPREHENSIVE METABOLIC PANEL
Alkaline Phosphatase: 86
BUN: 8
Calcium: 8.9
Glucose, Bld: 161 — ABNORMAL HIGH
Potassium: 4.6
Total Protein: 7.3

## 2011-01-14 LAB — URINALYSIS, ROUTINE W REFLEX MICROSCOPIC
Hgb urine dipstick: NEGATIVE
Nitrite: NEGATIVE
Specific Gravity, Urine: 1.043 — ABNORMAL HIGH
Urobilinogen, UA: 0.2

## 2011-01-14 LAB — URINE MICROSCOPIC-ADD ON

## 2011-01-14 LAB — CBC
HCT: 44.9
Hemoglobin: 14.7
MCHC: 32.7
RDW: 17.1 — ABNORMAL HIGH

## 2011-02-03 ENCOUNTER — Institutional Professional Consult (permissible substitution): Payer: Medicaid Other | Admitting: Internal Medicine

## 2011-06-24 ENCOUNTER — Inpatient Hospital Stay (HOSPITAL_COMMUNITY)
Admission: EM | Admit: 2011-06-24 | Discharge: 2011-06-29 | DRG: 202 | Disposition: A | Discharge status: Home Health | Payer: Medicaid Other | Attending: Internal Medicine | Admitting: Internal Medicine

## 2011-06-24 ENCOUNTER — Emergency Department (HOSPITAL_COMMUNITY): Payer: Medicaid Other

## 2011-06-24 ENCOUNTER — Other Ambulatory Visit: Payer: Self-pay

## 2011-06-24 ENCOUNTER — Encounter (HOSPITAL_COMMUNITY): Payer: Self-pay | Admitting: *Deleted

## 2011-06-24 DIAGNOSIS — J90 Pleural effusion, not elsewhere classified: Secondary | ICD-10-CM

## 2011-06-24 DIAGNOSIS — Z886 Allergy status to analgesic agent status: Secondary | ICD-10-CM

## 2011-06-24 DIAGNOSIS — E662 Morbid (severe) obesity with alveolar hypoventilation: Secondary | ICD-10-CM | POA: Diagnosis present

## 2011-06-24 DIAGNOSIS — G252 Other specified forms of tremor: Secondary | ICD-10-CM | POA: Diagnosis present

## 2011-06-24 DIAGNOSIS — I509 Heart failure, unspecified: Secondary | ICD-10-CM | POA: Diagnosis present

## 2011-06-24 DIAGNOSIS — T486X5A Adverse effect of antiasthmatics, initial encounter: Secondary | ICD-10-CM | POA: Diagnosis present

## 2011-06-24 DIAGNOSIS — E678 Other specified hyperalimentation: Secondary | ICD-10-CM

## 2011-06-24 DIAGNOSIS — Z79899 Other long term (current) drug therapy: Secondary | ICD-10-CM

## 2011-06-24 DIAGNOSIS — Z7982 Long term (current) use of aspirin: Secondary | ICD-10-CM

## 2011-06-24 DIAGNOSIS — G250 Essential tremor: Secondary | ICD-10-CM | POA: Diagnosis present

## 2011-06-24 DIAGNOSIS — R0902 Hypoxemia: Secondary | ICD-10-CM

## 2011-06-24 DIAGNOSIS — I5032 Chronic diastolic (congestive) heart failure: Secondary | ICD-10-CM | POA: Diagnosis present

## 2011-06-24 DIAGNOSIS — E872 Acidosis, unspecified: Secondary | ICD-10-CM | POA: Diagnosis present

## 2011-06-24 DIAGNOSIS — J441 Chronic obstructive pulmonary disease with (acute) exacerbation: Secondary | ICD-10-CM

## 2011-06-24 DIAGNOSIS — E785 Hyperlipidemia, unspecified: Secondary | ICD-10-CM | POA: Diagnosis present

## 2011-06-24 DIAGNOSIS — R002 Palpitations: Secondary | ICD-10-CM | POA: Diagnosis present

## 2011-06-24 DIAGNOSIS — D649 Anemia, unspecified: Secondary | ICD-10-CM | POA: Diagnosis present

## 2011-06-24 DIAGNOSIS — J45909 Unspecified asthma, uncomplicated: Principal | ICD-10-CM | POA: Diagnosis present

## 2011-06-24 DIAGNOSIS — R5381 Other malaise: Secondary | ICD-10-CM | POA: Diagnosis present

## 2011-06-24 DIAGNOSIS — Z6841 Body Mass Index (BMI) 40.0 and over, adult: Secondary | ICD-10-CM

## 2011-06-24 DIAGNOSIS — G4733 Obstructive sleep apnea (adult) (pediatric): Secondary | ICD-10-CM | POA: Diagnosis present

## 2011-06-24 DIAGNOSIS — R0602 Shortness of breath: Secondary | ICD-10-CM

## 2011-06-24 HISTORY — DX: Other malaise: R53.81

## 2011-06-24 HISTORY — DX: Obstructive sleep apnea (adult) (pediatric): G47.33

## 2011-06-24 HISTORY — DX: Hyperlipidemia, unspecified: E78.5

## 2011-06-24 HISTORY — DX: Morbid (severe) obesity with alveolar hypoventilation: E66.2

## 2011-06-24 HISTORY — DX: Unspecified diastolic (congestive) heart failure: I50.30

## 2011-06-24 LAB — BLOOD GAS, ARTERIAL
Drawn by: 27407
O2 Content: 4 L/min
Patient temperature: 98.6
TCO2: 46.5 mmol/L (ref 0–100)
pH, Arterial: 7.276 — ABNORMAL LOW (ref 7.350–7.400)

## 2011-06-24 LAB — TSH: TSH: 0.325 u[IU]/mL — ABNORMAL LOW (ref 0.350–4.500)

## 2011-06-24 LAB — HEPATIC FUNCTION PANEL
Albumin: 3 g/dL — ABNORMAL LOW (ref 3.5–5.2)
Alkaline Phosphatase: 98 U/L (ref 39–117)
Total Protein: 7.9 g/dL (ref 6.0–8.3)

## 2011-06-24 LAB — DIFFERENTIAL
Eosinophils Relative: 1 % (ref 0–5)
Lymphocytes Relative: 18 % (ref 12–46)
Lymphs Abs: 2.5 10*3/uL (ref 0.7–4.0)
Monocytes Absolute: 0.8 10*3/uL (ref 0.1–1.0)

## 2011-06-24 LAB — CBC
HCT: 38.5 % (ref 36.0–46.0)
Hemoglobin: 10.6 g/dL — ABNORMAL LOW (ref 12.0–15.0)
MCV: 104.1 fL — ABNORMAL HIGH (ref 78.0–100.0)
Platelets: 311 10*3/uL (ref 150–400)
RBC: 3.7 MIL/uL — ABNORMAL LOW (ref 3.87–5.11)
WBC: 13.7 10*3/uL — ABNORMAL HIGH (ref 4.0–10.5)

## 2011-06-24 LAB — IRON AND TIBC: TIBC: 432 ug/dL (ref 250–470)

## 2011-06-24 LAB — POCT I-STAT TROPONIN I: Troponin i, poc: 0.01 ng/mL (ref 0.00–0.08)

## 2011-06-24 LAB — BASIC METABOLIC PANEL
CO2: 41 mEq/L (ref 19–32)
Calcium: 9 mg/dL (ref 8.4–10.5)
Glucose, Bld: 160 mg/dL — ABNORMAL HIGH (ref 70–99)
Sodium: 141 mEq/L (ref 135–145)

## 2011-06-24 LAB — RETICULOCYTES
RBC.: 3.91 MIL/uL (ref 3.87–5.11)
Retic Ct Pct: 4 % — ABNORMAL HIGH (ref 0.4–3.1)

## 2011-06-24 LAB — FERRITIN: Ferritin: 38 ng/mL (ref 10–291)

## 2011-06-24 MED ORDER — FUROSEMIDE 10 MG/ML IJ SOLN
40.0000 mg | Freq: Two times a day (BID) | INTRAMUSCULAR | Status: DC
  Administered 2011-06-24 – 2011-06-25 (×2): 40 mg via INTRAVENOUS
  Filled 2011-06-24 (×5): qty 4

## 2011-06-24 MED ORDER — IPRATROPIUM BROMIDE 0.02 % IN SOLN
0.5000 mg | RESPIRATORY_TRACT | Status: DC | PRN
  Administered 2011-06-24 (×2): 0.5 mg via RESPIRATORY_TRACT
  Filled 2011-06-24 (×2): qty 2.5

## 2011-06-24 MED ORDER — SODIUM CHLORIDE 0.9 % IJ SOLN: 3.0000 mL | INTRAMUSCULAR | Status: DC | PRN

## 2011-06-24 MED ORDER — ALBUTEROL SULFATE (5 MG/ML) 0.5% IN NEBU
2.5000 mg | INHALATION_SOLUTION | RESPIRATORY_TRACT | Status: DC | PRN
  Administered 2011-06-24 – 2011-06-25 (×3): 2.5 mg via RESPIRATORY_TRACT
  Filled 2011-06-24 (×3): qty 0.5

## 2011-06-24 MED ORDER — ONDANSETRON HCL 4 MG/2ML IJ SOLN: 4.0000 mg | Freq: Four times a day (QID) | INTRAMUSCULAR | Status: DC | PRN

## 2011-06-24 MED ORDER — SIMVASTATIN 5 MG PO TABS
5.0000 mg | ORAL_TABLET | Freq: Every day | ORAL | Status: DC
  Administered 2011-06-24 – 2011-06-28 (×5): 5 mg via ORAL
  Filled 2011-06-24 (×6): qty 1

## 2011-06-24 MED ORDER — ALBUTEROL SULFATE (5 MG/ML) 0.5% IN NEBU
5.0000 mg | INHALATION_SOLUTION | Freq: Once | RESPIRATORY_TRACT | Status: AC
  Administered 2011-06-24: 5 mg via RESPIRATORY_TRACT
  Filled 2011-06-24: qty 0.5

## 2011-06-24 MED ORDER — FLUTICASONE-SALMETEROL 115-21 MCG/ACT IN AERO: 2.0000 | INHALATION_SPRAY | Freq: Every day | RESPIRATORY_TRACT | Status: DC

## 2011-06-24 MED ORDER — ALBUTEROL SULFATE (5 MG/ML) 0.5% IN NEBU
INHALATION_SOLUTION | RESPIRATORY_TRACT | Status: AC
  Administered 2011-06-24: 11:00:00
  Filled 2011-06-24: qty 0.5

## 2011-06-24 MED ORDER — IPRATROPIUM BROMIDE 0.02 % IN SOLN
0.5000 mg | Freq: Once | RESPIRATORY_TRACT | Status: AC
  Administered 2011-06-24: 0.5 mg via RESPIRATORY_TRACT
  Filled 2011-06-24: qty 2.5

## 2011-06-24 MED ORDER — FLUTICASONE-SALMETEROL 250-50 MCG/DOSE IN AEPB
1.0000 | INHALATION_SPRAY | Freq: Two times a day (BID) | RESPIRATORY_TRACT | Status: DC
  Administered 2011-06-24 – 2011-06-29 (×9): 1 via RESPIRATORY_TRACT
  Filled 2011-06-24: qty 14

## 2011-06-24 MED ORDER — IOHEXOL 350 MG/ML SOLN
100.0000 mL | Freq: Once | INTRAVENOUS | Status: AC | PRN
  Administered 2011-06-24: 200 mL via INTRAVENOUS

## 2011-06-24 MED ORDER — METHYLPREDNISOLONE SODIUM SUCC 40 MG IJ SOLR
40.0000 mg | Freq: Two times a day (BID) | INTRAMUSCULAR | Status: DC
  Administered 2011-06-24 – 2011-06-25 (×3): 40 mg via INTRAVENOUS
  Filled 2011-06-24 (×4): qty 1

## 2011-06-24 MED ORDER — METHYLPREDNISOLONE SODIUM SUCC 125 MG IJ SOLR
125.0000 mg | Freq: Once | INTRAMUSCULAR | Status: AC
  Administered 2011-06-24: 125 mg via INTRAVENOUS
  Filled 2011-06-24: qty 2

## 2011-06-24 MED ORDER — ACETAMINOPHEN 325 MG PO TABS
650.0000 mg | ORAL_TABLET | Freq: Four times a day (QID) | ORAL | Status: DC | PRN
  Administered 2011-06-26 – 2011-06-28 (×2): 650 mg via ORAL
  Filled 2011-06-24 (×2): qty 2

## 2011-06-24 MED ORDER — PANTOPRAZOLE SODIUM 40 MG PO TBEC
40.0000 mg | DELAYED_RELEASE_TABLET | Freq: Every day | ORAL | Status: DC
  Administered 2011-06-24 – 2011-06-28 (×5): 40 mg via ORAL
  Filled 2011-06-24 (×3): qty 1

## 2011-06-24 MED ORDER — SODIUM CHLORIDE 0.9 % IJ SOLN
3.0000 mL | Freq: Two times a day (BID) | INTRAMUSCULAR | Status: DC
  Administered 2011-06-24 – 2011-06-28 (×8): 3 mL via INTRAVENOUS

## 2011-06-24 MED ORDER — ENOXAPARIN SODIUM 40 MG/0.4ML ~~LOC~~ SOLN
40.0000 mg | SUBCUTANEOUS | Status: DC
  Administered 2011-06-24 – 2011-06-28 (×5): 40 mg via SUBCUTANEOUS
  Filled 2011-06-24 (×6): qty 0.4

## 2011-06-24 MED ORDER — ALBUTEROL SULFATE (5 MG/ML) 0.5% IN NEBU
5.0000 mg | INHALATION_SOLUTION | Freq: Once | RESPIRATORY_TRACT | Status: AC
  Administered 2011-06-24: 5 mg via RESPIRATORY_TRACT
  Filled 2011-06-24: qty 1

## 2011-06-24 MED ORDER — TIOTROPIUM BROMIDE MONOHYDRATE 18 MCG IN CAPS
18.0000 ug | ORAL_CAPSULE | Freq: Every day | RESPIRATORY_TRACT | Status: DC
  Administered 2011-06-25: 18 ug via RESPIRATORY_TRACT
  Filled 2011-06-24: qty 5

## 2011-06-24 MED ORDER — SODIUM CHLORIDE 0.9 % IV SOLN: 250.0000 mL | INTRAVENOUS | Status: DC | PRN

## 2011-06-24 MED ORDER — ACETAMINOPHEN 650 MG RE SUPP: 650.0000 mg | Freq: Four times a day (QID) | RECTAL | Status: DC | PRN

## 2011-06-24 MED ORDER — DEXTROSE 5 % IV SOLN
500.0000 mg | Freq: Once | INTRAVENOUS | Status: AC
  Administered 2011-06-24: 500 mg via INTRAVENOUS
  Filled 2011-06-24: qty 500

## 2011-06-24 MED ORDER — ONDANSETRON HCL 4 MG PO TABS: 4.0000 mg | ORAL_TABLET | Freq: Four times a day (QID) | ORAL | Status: DC | PRN

## 2011-06-24 NOTE — Progress Notes (Signed)
CRITICAL VALUE ALERT  Critical value received:  ABG  Date of notification:  06/24/2011  Time of notification:  1515  Critical value read back:yes  Nurse who received alert:  Susann Givens   MD notified (1st page):  klima  Time of first page:  1600  MD notified (2nd page):  Time of second page:  Responding MD:    Time MD responded:

## 2011-06-24 NOTE — ED Provider Notes (Signed)
History     CSN: [id number]  Arrival date & time 06/24/11  0404   First MD Initiated Contact with Patient 06/24/11 [phone]      Chief Complaint  Patient presents with  . Shortness of Breath    (Consider location/radiation/quality/duration/timing/severity/associated sxs/prior treatment) Patient is a 61 y.o. female presenting with shortness of breath. The history is provided by the patient and the EMS personnel.  Shortness of Breath  The current episode started more than 1 week ago. The onset was gradual. The problem occurs continuously. The problem has been gradually worsening. The problem is moderate (The patient reports getting out of breath after walking 5 feet). The symptoms are relieved by rest and beta-agonist inhalers (Oxygen). The symptoms are aggravated by activity and a supine position. Associated symptoms include chest pressure, orthopnea, cough, shortness of breath and wheezing. Pertinent negatives include no chest pain, no fever, no rhinorrhea, no sore throat and no stridor. The cough's precipitants include activity. The cough is non-productive. The cough is relieved by beta-agonist inhalers and rest. The cough is worsened by activity. There was no intake of a foreign body. She was not exposed to toxic fumes. She has had no prior steroid use. Past medical history comments: COPD, no known history of congestive heart failure.    History reviewed. No pertinent past medical history.  History reviewed. No pertinent past surgical history.  History reviewed. No pertinent family history.  History  Substance Use Topics  . Smoking status: Not on file  . Smokeless tobacco: Not on file  . Alcohol Use: Not on file    OB History    Grav Para Term Preterm Abortions TAB SAB Ect Mult Living                  Review of Systems  Constitutional: Positive for fatigue. Negative for fever, chills, diaphoresis, activity change, appetite change and unexpected weight change.  HENT: Negative  for hearing loss, ear pain, nosebleeds, congestion, sore throat, rhinorrhea, mouth sores, neck pain, neck stiffness, dental problem, postnasal drip and ear discharge.   Eyes: Negative for photophobia, pain, discharge and redness.  Respiratory: Positive for cough, shortness of breath and wheezing. Negative for choking and stridor.   Cardiovascular: Positive for orthopnea. Negative for chest pain, palpitations and leg swelling.  Gastrointestinal: Negative for nausea, vomiting, abdominal pain and diarrhea.  Genitourinary: Negative for dysuria and flank pain.  Musculoskeletal: Negative for myalgias.  Skin: Negative for color change, pallor, rash and wound.  Neurological: Negative for dizziness, syncope, weakness, light-headedness, numbness and headaches.  Hematological: Negative for adenopathy.  Psychiatric/Behavioral: Negative.     Allergies  Codeine  Home Medications   Current Outpatient Rx  Name Route Sig Dispense Refill  . ASPIRIN EC 81 MG PO TBEC Oral Take 81 mg by mouth daily.    Marland Kitchen FLUTICASONE-SALMETEROL 115-21 MCG/ACT IN AERO Inhalation Inhale 2 puffs into the lungs daily.     Marland Kitchen PRAVASTATIN SODIUM 10 MG PO TABS Oral Take 10 mg by mouth daily.      BP 118/89  Pulse 108  Temp(Src) 97.9 F (36.6 C) (Oral)  Resp 19  SpO2 97%  Physical Exam  Nursing note and vitals reviewed. Constitutional: She is oriented to person, place, and time. She appears well-developed and well-nourished. No distress.  HENT:  Head: Normocephalic and atraumatic.  Right Ear: External ear normal.  Left Ear: External ear normal.  Nose: Nose normal.  Mouth/Throat: Oropharynx is clear and moist.  Eyes: Conjunctivae and EOM are  normal. Pupils are equal, round, and reactive to light.  Neck: Normal range of motion. Neck supple. No JVD present. No tracheal deviation present.  Cardiovascular: Regular rhythm, normal heart sounds and intact distal pulses.  Tachycardia present.  Exam reveals no gallop and no  friction rub.   No murmur heard. Pulmonary/Chest: Effort normal. No accessory muscle usage or stridor. Not tachypneic. No respiratory distress. She has no decreased breath sounds. She has wheezes in the right upper field, the right middle field, the left upper field and the left middle field. She has rhonchi in the right middle field, the right lower field, the left middle field and the left lower field. She has no rales. She exhibits no tenderness.  Abdominal: Soft. Bowel sounds are normal. She exhibits no distension. There is no tenderness. There is no rebound and no guarding.  Musculoskeletal: Normal range of motion. She exhibits no edema and no tenderness.  Lymphadenopathy:    She has no cervical adenopathy.  Neurological: She is alert and oriented to person, place, and time. She has normal reflexes. No cranial nerve deficit. She exhibits normal muscle tone. Coordination normal.  Skin: Skin is warm and dry. No rash noted. She is not diaphoretic. No erythema. No pallor.  Psychiatric: She has a normal mood and affect. Her behavior is normal. Judgment and thought content normal.    ED Course  Procedures (including critical care time)   Date: 06/24/2011  Rate: 118  Rhythm: sinus tachycardia  QRS Axis: normal  Intervals: normal  ST/T Wave abnormalities: normal  Conduction Disutrbances:none  Narrative Interpretation: Sinus tachycardia, otherwise non-provocative EKG  Old EKG Reviewed: unchanged    Labs Reviewed  CBC - Abnormal; Notable for the following:    WBC 13.7 (*)    RBC 3.70 (*)    Hemoglobin 10.6 (*)    MCV 104.1 (*)    MCHC 27.5 (*)    All other components within normal limits  DIFFERENTIAL - Abnormal; Notable for the following:    Neutro Abs 10.3 (*)    All other components within normal limits  BASIC METABOLIC PANEL - Abnormal; Notable for the following:    Chloride 93 (*)    CO2 41 (*)    Glucose, Bld 160 (*)    All other components within normal limits  D-DIMER,  QUANTITATIVE - Abnormal; Notable for the following:    D-Dimer, Quant 1.13 (*)    All other components within normal limits  PRO B NATRIURETIC PEPTIDE  POCT I-STAT TROPONIN I  D-DIMER, QUANTITATIVE   Dg Chest 2 View  06/24/2011  *RADIOLOGY REPORT*  Clinical Data: Shortness of breath; dyspnea.  CHEST - 2 VIEW  Comparison: Chest radiograph performed 07/11/2007  Findings: The lungs are well-aerated.  Vascular congestion is noted, with mildly increased interstitial markings, raising concern for mild interstitial edema.  There is no evidence of focal opacification, pleural effusion or pneumothorax.  The heart is borderline enlarged.  No acute osseous abnormalities are seen.  IMPRESSION: Vascular congestion and borderline cardiomegaly, with mildly increased interstitial markings, raising concern for mild interstitial edema.  Original Report Authenticated By: Tonia Ghent, M.D.   Ct Angio Chest W/cm &/or Wo Cm  06/24/2011  *RADIOLOGY REPORT*  Clinical Data: Shortness of breath  CT ANGIOGRAPHY CHEST  Technique:  Multidetector CT imaging of the chest using the standard protocol during bolus administration of intravenous contrast. Multiplanar reconstructed images including MIPs were obtained and reviewed to evaluate the vascular anatomy.  Contrast: OMNIPAQUE IOHEXOL 350 MG/ML  IV SOLN  Comparison: Chest radiographs dated 06/24/2011  Findings: No evidence of pulmonary embolism.  Patchy ground-glass opacities, likely reflecting interstitial edema, with a trace left pleural effusion.  Underlying moderate emphysematous changes.  Mild cardiomegaly.  No pericardial effusion.  Coronary atherosclerosis.  Atherosclerotic calcifications of the aortic arch.  Small mediastinal/right hilar lymph nodes which do not meet pathologic CT size criteria.  No suspicious axillary lymphadenopathy.  Visualized thyroid is unremarkable.  Mild degenerative changes of the visualized thoracolumbar spine.  IMPRESSION: No evidence of  pulmonary embolism.  Mild cardiomegaly with suspected interstitial edema and a trace left pleural effusion.  Underlying moderate emphysematous changes.  Original Report Authenticated By: Charline Bills, M.D.     No diagnosis found.    MDM  Pneumonia, bronchitis, acute COPD exacerbation, new onset congestive heart failure, pulmonary embolism, anemia, electrolyte abnormality, myocardial infarction are all entertained as potential etiologies of the patient's symptoms. Her ECG in her symptoms on their own do not suggest myocardial infarction. A pulmonary embolism is a possibility and as such I've ordered a CT angiogram of the chest to evaluate further. She does have mild anemia but not significant enough to cause the problem on its own. Her carbon dioxide level suggests an element of chronic CO2 retention.          Felisa Bonier, MD 06/24/11 [phone]

## 2011-06-24 NOTE — Progress Notes (Signed)
Placed pt on BIPAP per MD order, setting 10/5 with 4 lpm O2 bleed in. HR 110, O2 Sat 94%. Pt. Tolerating well at this time.

## 2011-06-24 NOTE — ED Notes (Addendum)
Called report to [phone].  There is not currently a bed in that room for this patient.  Rn is working with Diplomatic Services operational officer to get a bed in the room asap

## 2011-06-24 NOTE — ED Notes (Signed)
Pt from home.  States that she has has SOB and heart racing x 1 week.  Pt on home O2L [place].  Pt desats to 70s with movement and activity.  Pt 92% on 4L-no respiratory distress.  Denies CP, N/V.  Feels that her heart is racing.  Vitals 122/74, HR 110 sinus tach.  20 ga RAC.

## 2011-06-24 NOTE — H&P (Signed)
Hospital Admission Note Date: 06/24/2011  Patient name:  [name]   Medical record number:  [id number] Date of birth:  [birth year]  Age: 61 y.o. Gender:  female PCP:    Dr. Roseanne Reno with [facility]   Medical Service:   Internal Medicine Teaching Service   Attending physician:  Dr. Josem Kaufmann First Contact:   Dr. Yaakov Guthrie   Pager: [phone] Second Contact:   Dr. Allena Katz           Pager: [phone] After Hours:    First Contact   Pager: [phone]      Second Contact  Pager: [phone]   Chief Complaint: shortness of breath, palpitations, tremors.   History of Present Illness: Patient is a 61 y.o. female with a PMHx of asthma , obstructive sleep apnea, morbid obesity comes to the Ed with c/o shortness of breath, palpitation and tremors. Her symptoms have been ongoing for past few months but got worse progressively over the weekend. She started using more albuterol for shortness of breath this past week. It did help her dyspnea but she also felt more palpitation and tremors. The night before admission, she got acutely short of breath and called the EMS. She felt better immediately after nebulizer treatment and returned to her baseline.  She also c/o of fatigue and sleepiness all day. She sleeps multiple times during the day and then is unable to sleep well at night. She tries to use her CPAP every night but is not able to keep it on for full 6 hours. She has sick contacts in form of her grand son who visited her last week and had a cold. She herself denies any such symptoms like fever, chills, rash, sore throat. She does have cough with any sputum production that does not have any pattern of occurence but gets better with albuterol. No recent travel, change in medications, edema of leg,chest pain or hemoptysis  Current Outpatient Medications: Albuterol q3 hrs scheduled, sometimes more than that.  advair Aspirin 81 pravachol 20 CPAP off and on  Allergies: Allergies  Allergen Reactions  .  Codeine Nausea And Vomiting     Past Medical History: Past Medical History  Diagnosis Date  . Asthma     with exaerbation and admission in 5/12. no h/ intubation. Is also suspected to have some degree of restrictive pattern and moderate emphysema  (per Dr. Teddy Spike note0 and h/o smoking in past.   . Obesity hypoventilation syndrome     followed with Dr. Stanton Kidney ([facility]) once in 2012.   Marland Kitchen OSA (obstructive sleep apnea)     on CPAP qhs. Sleep study in 2009 confirmed. Not very compliant with CPAP  . Diastolic CHF     Echo 2009 confirmed (poor acoustic window) , EF 55%,, mild RVH  . Physical deconditioning     wheelchair bound, unable to perforrm ADLs at home    Past Surgical History: History reviewed. No pertinent past surgical history.  Family History: Family History  Problem Relation Age of Onset  . COPD Father     Social History: History   Social History  . Marital Status: Single    Spouse Name: N/A    Number of Children: N/A  . Years of Education: N/A   Occupational History  . Not on file.   Social History Main Topics  . Smoking status: Not on file  . Smokeless tobacco: Not on file  . Alcohol Use: Not on file  . Drug Use: Not on file  .  Sexually Active: Not on file   Other Topics Concern  . Not on file   Social History Narrative   Lives with her son who has Bipolar in [place]. She takes care of him. She has one sister who helps her with getting to Doctor's appointment and other commuting needs. She is unable to bathe, clean herself, or perform ADLs requiring exertion. She is wheelchair bound and gets short of breath with minimal exertion. 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 She has medicaid. She use to smoke 2 ppd but quit 10 yrs ago. She use to drink 16 oz beer daily but quit 10 yrs ago. She never did illicit drugs. She has graduated from college.     Review of Systems: As per hpi  Vital Signs:  Filed Vitals:    06/24/11 0604  BP: 118/89  Pulse: 108  Temp: 97.9 F (36.6 C)  Resp: 19     Physical Exam: General: Vital signs reviewed and noted. Obese, in no distress, speaking in full sentences.   Head: Normocephalic, atraumatic.  Eyes: PERRLA, EOMI  Throat: Oropharynx nonerythematous, no exudate appreciated.   Neck: No deformities, masses, or tenderness noted.Supple, No carotid Bruits, no JVD.  Lungs:  Distant breath sounds, prolonged expiration, no wheezes or crackles.  Heart: RRR. S1 and S2 normal without gallop, murmur, or rubs.  Abdomen:  BS normoactive. Soft, Nondistended, non-tender.  No masses or organomegaly.  Extremities: 1+ pretibial edema.  Neurologic: A&O X3, CN II - XII are grossly intact. Motor strength is 5/5 in the all 4 extremities, Sensations intact to light touch, Cerebellar signs negative.  Skin: Dry skin diffusely without rashes or breakdown, unhygienic    Lab results: Basic Metabolic Panel: Recent Labs  [facility] 06/24/11 0447   NA 141   K 4.1   CL 93*   CO2 41*   GLUCOSE 160*   BUN 10   CREATININE 0.51   CALCIUM 9.0   MG --   PHOS --   CBC: Recent Labs  Basename 06/24/11 0447   WBC 13.7*   NEUTROABS 10.3*   HGB 10.6*   HCT 38.5   MCV 104.1*   PLT 311   D-Dimer: Recent Labs  Basename 06/24/11 0502   DDIMER 1.13*   Pro- BNP= 98.5 ( last was <30 in 2009)  POC CE- neg  Imaging results:  Dg Chest 2 View  06/24/2011  *RADIOLOGY REPORT*  Clinical Data: Shortness of breath; dyspnea.  CHEST - 2 VIEW  Comparison: Chest radiograph performed 07/11/2007  Findings: The lungs are well-aerated.  Vascular congestion is noted, with mildly increased interstitial markings, raising concern for mild interstitial edema.  There is no evidence of focal opacification, pleural effusion or pneumothorax.  The heart is borderline enlarged.  No acute osseous abnormalities are seen.  IMPRESSION: Vascular congestion and borderline cardiomegaly, with mildly increased interstitial  markings, raising concern for mild interstitial edema.  Original Report Authenticated By: Tonia Ghent, M.D.   Ct Angio Chest W/cm &/or Wo Cm  06/24/2011  *RADIOLOGY REPORT*  Clinical Data: Shortness of breath  CT ANGIOGRAPHY CHEST  Technique:  Multidetector CT imaging of the chest using the standard protocol during bolus administration of intravenous contrast. Multiplanar reconstructed images including MIPs were obtained and reviewed to evaluate the vascular anatomy.  Contrast: OMNIPAQUE IOHEXOL 350 MG/ML IV SOLN  Comparison: Chest radiographs dated 06/24/2011  Findings: No evidence of pulmonary embolism.  Patchy ground-glass opacities, likely reflecting interstitial edema, with a  trace left pleural effusion.  Underlying moderate emphysematous changes.  Mild cardiomegaly.  No pericardial effusion.  Coronary atherosclerosis.  Atherosclerotic calcifications of the aortic arch.  Small mediastinal/right hilar lymph nodes which do not meet pathologic CT size criteria.  No suspicious axillary lymphadenopathy.  Visualized thyroid is unremarkable.  Mild degenerative changes of the visualized thoracolumbar spine.  IMPRESSION: No evidence of pulmonary embolism.  Mild cardiomegaly with suspected interstitial edema and a trace left pleural effusion.  Underlying moderate emphysematous changes.  Original Report Authenticated By: Charline Bills, M.D.     Other results: EKG: sinus tachycardia, mild RAD, no ST/T changes  Assessment & Plan:  61 y/o W with pmh of asthma, OSA, morbid obesity comes to Ed with shortness of breath. She was desaturating in 70% on exertion and high 90% on 4 L.    1. Shortness of breath- Multifactorial. She has asthma flare but she is not wheezing currently after breathing treatment. She also has diastolic heart failure and she has some pulmonary edema on CXR. She also has daytime somnolence and non compliance with CPAP , so OSA is contributing to her symptoms. PE is ruled out with  CTA. ACS is less likely given normal EKG. No evident pneumonia and she does not have any symptoms.  - Will admit to med-surg unit - Check ABG - Q3 hrs duo nebs prn - Continue advair. Try spiriva although patient said it was tried before in hospital and she had chest discomfort with it. Will stop it if it recurs.  - Solumedrol 40 iv bid - protonix 40 po qd - CPAP qhs - O2 supplements (3 l at home) - lasix 40 iv bid X4 dose, re-assess after that for further diuresis. May need po lasix at discharge  - No antibiotics at this time.   2. OSA- CPAP qhs  3. Tremors and palpitation Secondary to albuterol most likely. Other causes- hypoxia, thyroid discomfort (check TSH)   4. HLD- continue statin  5. Deconditioning- patient is unable to perform ADLs like bathing, cooking etc. Will get PT/OT consult and SW assistance for placement vs home health nursing   6. Anemia- macrocytic. Check retic count, ferritin, thiamine, folic acid 7. DVT Px- lovenox   Bethel Born, M.D. (PGY3):  ____________________________________    Date/ Time:     ____________________________________

## 2011-06-25 DIAGNOSIS — E662 Morbid (severe) obesity with alveolar hypoventilation: Secondary | ICD-10-CM | POA: Diagnosis present

## 2011-06-25 LAB — BASIC METABOLIC PANEL
BUN: 12 mg/dL (ref 6–23)
Calcium: 8.5 mg/dL (ref 8.4–10.5)
GFR calc non Af Amer: >90 mL/min (ref >90)
Glucose, Bld: 167 mg/dL — ABNORMAL HIGH (ref 70–99)
Sodium: 142 mEq/L (ref 135–145)

## 2011-06-25 LAB — TECHNOLOGIST SMEAR REVIEW

## 2011-06-25 MED ORDER — ALBUTEROL SULFATE (5 MG/ML) 0.5% IN NEBU
2.5000 mg | INHALATION_SOLUTION | Freq: Four times a day (QID) | RESPIRATORY_TRACT | Status: DC
  Administered 2011-06-25 – 2011-06-27 (×6): 2.5 mg via RESPIRATORY_TRACT
  Filled 2011-06-25 (×7): qty 0.5

## 2011-06-25 MED ORDER — IPRATROPIUM BROMIDE HFA 17 MCG/ACT IN AERS
2.0000 | INHALATION_SPRAY | Freq: Four times a day (QID) | RESPIRATORY_TRACT | Status: DC
  Administered 2011-06-25: 2 via RESPIRATORY_TRACT
  Filled 2011-06-25: qty 12.9

## 2011-06-25 MED ORDER — PREDNISONE 20 MG PO TABS
40.0000 mg | ORAL_TABLET | Freq: Every day | ORAL | Status: DC
  Administered 2011-06-26 – 2011-06-29 (×4): 40 mg via ORAL
  Filled 2011-06-25 (×8): qty 2

## 2011-06-25 NOTE — Progress Notes (Signed)
Subjective: Breathing is better today.  She wore bipap all night but did not sleep well.     Objective: Vital signs in last 24 hours: Filed Vitals:   06/24/11 2209 06/24/11 2222 06/24/11 2228 06/25/11 0628  BP: 115/68   109/63  Pulse: 103  110 94  Temp: 98.3 F (36.8 C)   98.6 F (37 C)  TempSrc: Oral   Oral  Resp: 18  18 20   Height:      Weight:      SpO2: 94% 94% 94% 92%   Weight change:   Intake/Output Summary (Last 24 hours) at 06/25/11 1346 Last data filed at 06/25/11 0900  Gross per 24 hour  Intake    840 ml  Output      0 ml  Net    840 ml   Physical Exam:  General: Vital signs reviewed and noted. Obese, in no distress Head: Normocephalic, atraumatic.  Eyes: PERRLA, EOMI  Neck: no JVD.  Lungs: Better air movement than yesterday but still poor air movement, prolonged expiration, no wheezes or crackles.  Heart: RRR. S1 and S2 normal without gallop, murmur, or rubs.  Abdomen: BS normoactive. Soft, Nondistended, non-tender. No masses or organomegaly.  Neurologic:  A&O X3, CN II - XII are grossly intact. Motor strength is 5/5 in the all 4 extremities Skin: Dry skin diffusely without rashes or breakdown, unhygienic    Lab Results: Basic Metabolic Panel:  Lab 06/25/11 1610 06/24/11 0447  NA 142 141  K 4.4 4.1  CL 95* 93*  CO2 43* 41*  GLUCOSE 167* 160*  BUN 12 10  CREATININE 0.56 0.51  CALCIUM 8.5 9.0  MG -- --  PHOS -- --   Liver Function Tests:  Lab 06/24/11 1324  AST 16  ALT 17  ALKPHOS 98  BILITOT 0.2*  PROT 7.9  ALBUMIN 3.0*   CBC:  Lab 06/24/11 0447  WBC 13.7*  NEUTROABS 10.3*  HGB 10.6*  HCT 38.5  MCV 104.1*  PLT 311   BNP:  Lab 06/24/11 0447  PROBNP 98.5   D-Dimer:  Lab 06/24/11 0502  DDIMER 1.13*   Thyroid Function Tests:  Lab 06/24/11 1324  TSH 0.325*  T4TOTAL --  FREET4 --  T3FREE --  THYROIDAB --   Anemia Panel:  Lab 06/24/11 1324  VITAMINB12 575  FOLATE >20.0  FERRITIN 38  TIBC 432  IRON 14*    RETICCTPCT 4.0*   Studies/Results: Dg Chest 2 View  06/24/2011  *RADIOLOGY REPORT*  Clinical Data: Shortness of breath; dyspnea.  CHEST - 2 VIEW  Comparison: Chest radiograph performed 07/11/2007  Findings: The lungs are well-aerated.  Vascular congestion is noted, with mildly increased interstitial markings, raising concern for mild interstitial edema.  There is no evidence of focal opacification, pleural effusion or pneumothorax.  The heart is borderline enlarged.  No acute osseous abnormalities are seen.  IMPRESSION: Vascular congestion and borderline cardiomegaly, with mildly increased interstitial markings, raising concern for mild interstitial edema.  Original Report Authenticated By: Tonia Ghent, M.D.   Ct Angio Chest W/cm &/or Wo Cm  06/24/2011  *RADIOLOGY REPORT*  Clinical Data: Shortness of breath  CT ANGIOGRAPHY CHEST  Technique:  Multidetector CT imaging of the chest using the standard protocol during bolus administration of intravenous contrast. Multiplanar reconstructed images including MIPs were obtained and reviewed to evaluate the vascular anatomy.  Contrast: OMNIPAQUE IOHEXOL 350 MG/ML IV SOLN  Comparison: Chest radiographs dated 06/24/2011  Findings: No evidence of pulmonary embolism.  Patchy  ground-glass opacities, likely reflecting interstitial edema, with a trace left pleural effusion.  Underlying moderate emphysematous changes.  Mild cardiomegaly.  No pericardial effusion.  Coronary atherosclerosis.  Atherosclerotic calcifications of the aortic arch.  Small mediastinal/right hilar lymph nodes which do not meet pathologic CT size criteria.  No suspicious axillary lymphadenopathy.  Visualized thyroid is unremarkable.  Mild degenerative changes of the visualized thoracolumbar spine.  IMPRESSION: No evidence of pulmonary embolism.  Mild cardiomegaly with suspected interstitial edema and a trace left pleural effusion.  Underlying moderate emphysematous changes.  Original Report  Authenticated By: Charline Bills, M.D.   Medications: I have reviewed the patient's current medications. Scheduled Meds:   . enoxaparin  40 mg Subcutaneous Q24H  . Fluticasone-Salmeterol  1 puff Inhalation BID  . furosemide  40 mg Intravenous BID  . methylPREDNISolone (SOLU-MEDROL) injection  40 mg Intravenous Q12H  . pantoprazole  40 mg Oral Q1200  . simvastatin  5 mg Oral q1800  . sodium chloride  3 mL Intravenous Q12H  . tiotropium  18 mcg Inhalation Daily   Continuous Infusions:  PRN Meds:.sodium chloride, acetaminophen, acetaminophen, albuterol, ipratropium, ondansetron (ZOFRAN) IV, ondansetron, sodium chloride   Assessment/Plan:   61 y/o W with pmh of asthma, OSA, morbid obesity came to Ed with shortness of breath. She was desaturating in 70% on exertion and high 90% on 4 L.   Shortness of breath- Likely multifactorial.  Based on blood gas yesterday, the predominant process is chronic as she was appropriately compensated for a chronic respiratory acidosis.  Had [facility] fax [place] copy of 2010 PFTs- quality of study so poor that is does not offer any reliable information.  My team is suspicious that her primary process is a combination of obesity hypoventilation and obstructive lung dz (COPD most likely per my attending's assessment, smoking history, bullae on CT).  OSA possible as well.  She is improved today after BIPAP last night. -BIPAP tonight, continue home BIPAP on discharge -Advair BID, start ipratropium inhaler QID -change steroids to prednisone, to start tomorrow morning  -Scheduled nebulizers albuterol only -Will need to be discharged on Advair BID, Ipratropium inhaler QID, Albuterol PRN with spacer from RT. -RT to come and give space and give inhaler teaching  Diastolic CHF- Does not seem volume overloaded per my team's assessment today.  Will discontinue Lasix.    OSA- BIPAP qhs   Tremors and palpitation Secondary to albuterol most likely. TSH borderline low.  Will  not treat at this time, recommend this be re-addressed as outpatient.  HLD- continue statin   Deconditioning- patient is unable to perform ADLs like bathing, cooking etc. Will get PT/OT consult and SW assistance for placement vs home health nursing   Anemia- macrocytic. Anemia panel showed possibly iron deficiency, but paradoxically MCV chronically elevated.  B12 and Folate normal.  Consider starting iron supplement on discharge vs deferring this to outpatient management.  Smear pending to check for spherocytes.  DVT Px- lovenox     LOS: 1 day   Blanca Friend 06/25/2011, 1:46 PM

## 2011-06-25 NOTE — Progress Notes (Signed)
CRITICAL VALUE ALERT  Critical value received:  CO2 43  Date of notification:  06/25/2011   Time of notification:  0800  Critical value read back:yes  Nurse who received alert:  Susann Givens   MD notified (1st page):  [place]   Time of first page:  0758  MD notified (2nd page):  Time of second page:  Responding MD:    Time MD responded:

## 2011-06-25 NOTE — Evaluation (Signed)
Physical Therapy Evaluation Patient Details Name: [name] MRN: [id number] DOB: 17-Sep-1950 Today's Date: 06/25/2011  Problem List:  Patient Active Problem List  Diagnoses  . HYPERCHOLESTEROLEMIA  . HYPOKALEMIA  . OBESITY, MORBID  . OBESITY HYPOVENTILATION SYNDROME  . SLEEP APNEA, OBSTRUCTIVE  . ANOXIC BRAIN DAMAGE  . CONGESTIVE HEART FAILURE  . Chronic diastolic heart failure  . BRONCHITIS, CHRONIC  . EMPHYSEMA  . ACUTE RESPIRATORY FAILURE  . CHRONIC RESPIRATORY FAILURE  . XERODERMA  . OSTEOARTHRITIS, KNEES, BILATERAL  . BACK PAIN  . INSOMNIA  . WEAKNESS  . TINGLING  . DYSPNEA  . 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 HIDRADENITIS SUPPURATIVA  . Asthma  . Obesity hypoventilation syndrome    Past Medical History:  Past Medical History  Diagnosis Date  . Asthma     with exaerbation and admission in 5/12. no h/ intubation. Is also suspected to have some degree of restrictive pattern and moderate emphysema  (per Dr. Teddy Spike note0 and h/o smoking in past.   . Obesity hypoventilation syndrome     followed with Dr. Stanton Kidney ([facility]) once in 2012.   Marland Kitchen OSA (obstructive sleep apnea)     on CPAP qhs. Sleep study in 2009 confirmed. Not very compliant with CPAP  . Diastolic CHF     Echo 2009 confirmed (poor acoustic window) , EF 55%,, mild RVH  . Physical deconditioning     wheelchair bound, unable to perforrm ADLs at home  . HLD (hyperlipidemia)    Past Surgical History:  Past Surgical History  Procedure Date  . Cesarean section     PT Assessment/Plan/Recommendation PT Assessment Clinical Impression Statement: Pt adm with obesity hypoventilation syndrome.  Pt with weakness and decr activity tolerance that has been exacerbated by decr activity due to sleepiness over past week.  Pt functions at w/c level at home and only amb a few feet into bathroom at home.  Has not been able to amb into bathroom at home over past week and has had to take w/c into  bathroom as well. Suggested to pt she consider ST-SNF to improve strength and mobility.  Pt not willing to consider ST-SNF at this time. PT Recommendation/Assessment: Patient will need skilled PT in the acute care venue PT Problem List: Decreased strength;Decreased activity tolerance;Decreased mobility;Obesity Barriers to Discharge: Decreased caregiver support PT Therapy Diagnosis : Generalized weakness;Difficulty walking PT Plan PT Frequency: Min 3X/week PT Treatment/Interventions: DME instruction;Gait training;Functional mobility training;Therapeutic activities;Therapeutic exercise;Balance training;Patient/family education PT Recommendation Follow Up Recommendations: Home health PT (since pt won't consider SNF) Equipment Recommended: None recommended by PT PT Goals  Acute Rehab PT Goals PT Goal Formulation: With patient Time For Goal Achievement: 7 days Pt will go Supine/Side to Sit: with modified independence PT Goal: Supine/Side to Sit - Progress: Goal set today Pt will go Sit to Supine/Side: with modified independence PT Goal: Sit to Supine/Side - Progress: Goal set today Pt will go Sit to Stand: with supervision PT Goal: Sit to Stand - Progress: Goal set today Pt will go Stand to Sit: with supervision PT Goal: Stand to Sit - Progress: Goal set today Pt will Transfer Bed to Chair/Chair to Bed: with supervision PT Transfer Goal: Bed to Chair/Chair to Bed - Progress: Goal set today Pt will Stand: with supervision;1 - 2 min PT Goal: Stand - Progress: Goal set today Pt will Ambulate: 1 - 15 feet;with min assist;with least restrictive assistive device PT Goal: Ambulate - Progress: Goal set today  PT  Evaluation Precautions/Restrictions  Precautions Precautions: Fall Prior Functioning  Home Living Type of Home: Apartment Home Layout: One level Home Access: Level entry Home Adaptive Equipment: Wheelchair - manual Prior Function Level of Independence: Independent with homemaking  with wheelchair;Independent with transfers;Independent with basic ADLs (was until past week.) Vocation: On disability Cognition   Sensation/Coordination   Extremity Assessment RLE Strength RLE Overall Strength Comments: grossly 4/5 LLE Strength LLE Overall Strength Comments: grossly 4/5 Mobility (including Balance) Transfers Transfers: Yes Sit to Stand: 4: Min assist;From chair/3-in-1;With upper extremity assist;With armrests Sit to Stand Details (indicate cue type and reason): Assist to lift hips. Stand to Sit: 4: Min assist;With upper extremity assist;With armrests;To chair/3-in-1 Stand to Sit Details: Assist to control descent    Exercise    End of Session PT - End of Session Activity Tolerance: Patient limited by fatigue Patient left: in chair;with call bell in reach Nurse Communication: Mobility status for transfers General Behavior During Session: [facility] for tasks performed Cognition: [facility] for tasks performed  [facility] 06/25/2011, 2:52 PM  [facility] PT [phone]

## 2011-06-25 NOTE — H&P (Signed)
Internal Medicine Attending Admission Note Date: 06/25/2011  Patient name: [name] Medical record number: [id number] Date of birth: [birth year] Age: 61 y.o. Gender: female  I saw and evaluated the patient. I reviewed the resident's note and I agree with the resident's findings and plan as documented in the resident's note.  Chief Complaint(s):  Increasing shortness of breath, palpitations, and tremors.  History - key components related to admission:  [name] is a 61 year old woman with a history of chronic obstructive lung disease, obesity hypoventilation syndrome, and obstructive sleep apnea who presents with increasing shortness of breath, palpitations, and tremors. She notes for the last several months she has progressively had worsening shortness of breath. Her grandson, who had a viral illness, visited at her last weekend. After that she noted a marked increase in the dyspnea that she was experiencing. She states she can only walk 5 feet before getting very short of breath. She admits to not using her BiPAP for the last several months. She also notes that she's used her albuterol every 3 hours and has only used her Advair once a day, if at all. She denies any fevers, shakes, chills, nausea, vomiting, chest pain, or urinary frequency. She does not use a spacer with her albuterol and her technique is somewhat suspect in the initial phases of the metered-dose inhaler use. She does appear to hold her breath.  Physical Exam - key components related to admission:  Filed Vitals:   06/24/11 2209 06/24/11 2222 06/24/11 2228 06/25/11 0628  BP: 115/68   109/63  Pulse: 103  110 94  Temp: 98.3 F (36.8 C)   98.6 F (37 C)  TempSrc: Oral   Oral  Resp: 18  18 20   Height:      Weight:      SpO2: 94% 94% 94% 92%   General: Well-developed, well-nourished, obese woman, sitting up in a chair with her elbows on a side table in the tripod position. Lungs: Distant breath sounds without wheezes,  rhonchi, or rales. Heart: Regular rate and rhythm, without murmurs, rubs, or gallops. Abdomen: Soft, non tender, active bowel sounds. Extremities: Trace pitting edema. Skin: Xerosis throughout particularly over the back.  Lab results:  Basic Metabolic Panel:  Basename 06/25/11 0646 06/24/11 0447  NA 142 141  K 4.4 4.1  CL 95* 93*  CO2 43* 41*  GLUCOSE 167* 160*  BUN 12 10  CREATININE 0.56 0.51  CALCIUM 8.5 9.0  MG -- --  PHOS -- --   Liver Function Tests:  Basename 06/24/11 1324  AST 16  ALT 17  ALKPHOS 98  BILITOT 0.2*  PROT 7.9  ALBUMIN 3.0*   CBC:  Basename 06/24/11 0447  WBC 13.7*  NEUTROABS 10.3*  HGB 10.6*  HCT 38.5  MCV 104.1*  PLT 311   D-Dimer:  Basename 06/24/11 0502  DDIMER 1.13*   Thyroid Function Tests:  Basename 06/24/11 1324  TSH 0.325*  T4TOTAL --  FREET4 --  T3FREE --  THYROIDAB --   Anemia Panel:  Basename 06/24/11 1324  VITAMINB12 575  FOLATE >20.0  FERRITIN 38  TIBC 432  IRON 14*  RETICCTPCT 4.0*   Misc. Labs:  ABG: 7.28/97/65  Imaging results:  Dg Chest 2 View  06/24/2011  *RADIOLOGY REPORT*  Clinical Data: Shortness of breath; dyspnea.  CHEST - 2 VIEW  Comparison: Chest radiograph performed 07/11/2007  Findings: The lungs are well-aerated.  Vascular congestion is noted, with mildly increased interstitial markings, raising concern for mild interstitial edema.  There is no evidence of focal opacification, pleural effusion or pneumothorax.  The heart is borderline enlarged.  No acute osseous abnormalities are seen.  IMPRESSION: Vascular congestion and borderline cardiomegaly, with mildly increased interstitial markings, raising concern for mild interstitial edema.  Original Report Authenticated By: Tonia Ghent, M.D.   Ct Angio Chest W/cm &/or Wo Cm  06/24/2011  *RADIOLOGY REPORT*  Clinical Data: Shortness of breath  CT ANGIOGRAPHY CHEST  Technique:  Multidetector CT imaging of the chest using the standard protocol during  bolus administration of intravenous contrast. Multiplanar reconstructed images including MIPs were obtained and reviewed to evaluate the vascular anatomy.  Contrast: OMNIPAQUE IOHEXOL 350 MG/ML IV SOLN  Comparison: Chest radiographs dated 06/24/2011  Findings: No evidence of pulmonary embolism.  Patchy ground-glass opacities, likely reflecting interstitial edema, with a trace left pleural effusion.  Underlying moderate emphysematous changes.  Mild cardiomegaly.  No pericardial effusion.  Coronary atherosclerosis.  Atherosclerotic calcifications of the aortic arch.  Small mediastinal/right hilar lymph nodes which do not meet pathologic CT size criteria.  No suspicious axillary lymphadenopathy.  Visualized thyroid is unremarkable.  Mild degenerative changes of the visualized thoracolumbar spine.  IMPRESSION: No evidence of pulmonary embolism.  Mild cardiomegaly with suspected interstitial edema and a trace left pleural effusion.  Underlying moderate emphysematous changes.  Original Report Authenticated By: Charline Bills, M.D.   Assessment & Plan by Problem:  [name] is a 61 year old woman with a history of obstructive lung disease, morbid obesity, obesity hypoventilation syndrome, and obstructive sleep apnea who presents with worsening shortness of breath, palpitations, and tremors. She has been noncompliant with her BiPAP therapy for the last several months and has been excessively using her beta agonist therapy. These 2 factors have led to her current complaints given her chronic underlying obesity hypoventilation syndrome. Her arterial blood gas proves she has a chronic respiratory acidemia. The cause of her underlying obstructive lung disease is unclear.  She states she has asthma but admits that she was told she has chronic bronchitis. Her CT scan shows minimal parenchymal disease although she does have some apical bullae consistent with her previous smoking history. If she does have asthma she is  inadequately controlled and she has not been using her steroid inhaler on a regular basis.  1) Obesity hypoventilation syndrome: We will attempt to get prior PFTs done at [facility]. This may help clarify her underlying obstructive lung disease as either asthma or COPD. This is important as it can better define the appropriate inhaler regimen she should receive. In the interim, we will continue the as needed albuterol, Advair inhaler, and begin a prednisone burst with taper given our concerns this represents COPD exacerbation. We will ask respiratory therapy to provide her with a spacer for her MDI and teach her how to use it. It is hoped that with better delivery of the bronchodilators into the distal bronchioles she will have an improvement in her symptoms. We will continue her oxygen therapy but make sure we do not increase it as to blunt her respiratory drive even further. Finally, while here we will asked dietary to see her and provide her with some information about an appropriate diet. Weight loss is very important in the treatment of obesity hypoventilation syndrome.  Equally important is her BiPAP therapy which we will continue. I have stressed with her the importance of compliance with this very important therapy in the management of her obesity hypoventilation syndrome and destructive sleep apnea.  2) Disposition: Once we  clarify her obstructive disease diagnosis, put her on appropriate bronchodilator regimen, and initiate nocturnal BiPAP therapy she should be ready for discharge in the very near future given this is more of a chronic rather than acute issue. The acute aspects have to do with noncompliance of the BiPAP therapy and the heavy reliance on the short acting bronchodilators.

## 2011-06-26 LAB — CBC
HCT: 38.2 % (ref 36.0–46.0)
Hemoglobin: 10.8 g/dL — ABNORMAL LOW (ref 12.0–15.0)
RDW: 14.9 % (ref 11.5–15.5)
WBC: 18 10*3/uL — ABNORMAL HIGH (ref 4.0–10.5)

## 2011-06-26 LAB — BASIC METABOLIC PANEL
BUN: 17 mg/dL (ref 6–23)
Chloride: 91 mEq/L — ABNORMAL LOW (ref 96–112)
GFR calc Af Amer: >90 mL/min (ref >90)
Glucose, Bld: 84 mg/dL (ref 70–99)
Potassium: 4.4 mEq/L (ref 3.5–5.1)
Sodium: 143 mEq/L (ref 135–145)

## 2011-06-26 MED ORDER — IPRATROPIUM BROMIDE 0.02 % IN SOLN
0.5000 mg | Freq: Four times a day (QID) | RESPIRATORY_TRACT | Status: DC
  Administered 2011-06-26 – 2011-06-27 (×5): 0.5 mg via RESPIRATORY_TRACT
  Filled 2011-06-26 (×6): qty 2.5

## 2011-06-26 NOTE — Progress Notes (Signed)
Placed pt on BiPAP with home FFM and circuit with 2L of O2 bled in. No complications noted.

## 2011-06-26 NOTE — Progress Notes (Signed)
Subjective: Pt feels better today- but says she is still sleepy and SOB with minimal exertion. She wore bipap all night since 9 pm till morning. Refused SNF yesterday and still is refusing. Would like to go home when ready. Denies any CP, N/V    Objective: Vital signs in last 24 hours: Filed Vitals:   06/25/11 2141 06/25/11 2309 06/26/11 0609 06/26/11 0808  BP: 110/60  125/73   Pulse: 106 107 88   Temp: 98.7 F (37.1 C)  98.2 F (36.8 C)   TempSrc: Oral  Oral   Resp: 20 18 19    Height:      Weight:      SpO2: 91% 92% 92% 94%   Weight change:   Intake/Output Summary (Last 24 hours) at 06/26/11 1038 Last data filed at 06/25/11 1848  Gross per 24 hour  Intake    680 ml  Output    100 ml  Net    580 ml   Physical Exam:  General: Vital signs reviewed and noted. Obese, in no distress Head: Normocephalic, atraumatic.  Eyes: PERRLA, EOMI  Neck: no JVD.  Lungs: Poor air movement, prolonged expiration, no wheezes or crackles.  Heart: RRR. S1 and S2 normal without gallop, murmur, or rubs.  Abdomen: BS normoactive. Soft, Nondistended, non-tender. No masses or organomegaly.  Neurologic:  A&O X3, CN II - XII are grossly intact. Motor strength is 5/5 in the all 4 extremities Skin: Dry skin diffusely without rashes or breakdown, unhygienic    Lab Results: Basic Metabolic Panel:  Lab 06/26/11 4098 06/25/11 0646  NA 143 142  K 4.4 4.4  CL 91* 95*  CO2 >45* 43*  GLUCOSE 84 167*  BUN 17 12  CREATININE 0.63 0.56  CALCIUM 9.0 8.5  MG -- --  PHOS -- --   Liver Function Tests:  Lab 06/24/11 1324  AST 16  ALT 17  ALKPHOS 98  BILITOT 0.2*  PROT 7.9  ALBUMIN 3.0*   CBC:  Lab 06/26/11 0640 06/24/11 0447  WBC 18.0* 13.7*  NEUTROABS -- 10.3*  HGB 10.8* 10.6*  HCT 38.2 38.5  MCV 101.9* 104.1*  PLT 351 311   BNP:  Lab 06/24/11 0447  PROBNP 98.5   D-Dimer:  Lab 06/24/11 0502  DDIMER 1.13*   Thyroid Function Tests:  Lab 06/24/11 1324  TSH 0.325*  T4TOTAL  --  FREET4 --  T3FREE --  THYROIDAB --   Anemia Panel:  Lab 06/24/11 1324  VITAMINB12 575  FOLATE >20.0  FERRITIN 38  TIBC 432  IRON 14*  RETICCTPCT 4.0*   Studies/Results: No results found. Medications: I have reviewed the patient's current medications. Scheduled Meds:    . albuterol  2.5 mg Nebulization Q6H  . enoxaparin  40 mg Subcutaneous Q24H  . Fluticasone-Salmeterol  1 puff Inhalation BID  . ipratropium  0.5 mg Nebulization Q6H  . pantoprazole  40 mg Oral Q1200  . predniSONE  40 mg Oral Q breakfast  . simvastatin  5 mg Oral q1800  . sodium chloride  3 mL Intravenous Q12H  . DISCONTD: furosemide  40 mg Intravenous BID  . DISCONTD: ipratropium  2 puff Inhalation QID  . DISCONTD: methylPREDNISolone (SOLU-MEDROL) injection  40 mg Intravenous Q12H  . DISCONTD: tiotropium  18 mcg Inhalation Daily   Continuous Infusions:  PRN Meds:.sodium chloride, acetaminophen, acetaminophen, ondansetron (ZOFRAN) IV, ondansetron, sodium chloride, DISCONTD: albuterol, DISCONTD: ipratropium   Assessment/Plan:   61 y/o W with pmh of asthma, OSA, morbid obesity came to  Ed with shortness of breath. She was desaturating in 70% on exertion and high 90% on 4 L.   Shortness of breath- Likely multifactorial.  Based on blood gas yesterday, the predominant process is chronic as she was appropriately compensated for a chronic respiratory acidosis.  Had [place] fax [place] copy of 2010 PFTs- quality of study so poor that is does not offer any reliable information.  My team is suspicious that her primary process is a combination of obesity hypoventilation and obstructive lung dz (COPD most likely per my attending's assessment, smoking history, bullae on CT).  OSA possible as well.  She is improved today after BIPAP last night.  -Continue BIPAP tonight, continue home BIPAP on discharge -Advair BID, start ipratropium inhaler QID -Cont prednisone. -Scheduled nebulizers albuterol only -Will need to be  discharged on Advair BID, Ipratropium inhaler QID, Albuterol PRN with spacer from RT. -RT to come and give space and give inhaler teaching - Says can't do PFT's  Diastolic CHF- Does not seem volume overloaded per my team's assessment today.  Discontinued Lasix.    OSA- BIPAP qhs   Tremors and palpitation Secondary to albuterol most likely. TSH borderline low.  Will not treat at this time, recommend this be re-addressed as outpatient.  HLD- continue statin   Deconditioning- patient is unable to perform ADLs like bathing, cooking etc.  PT recs- SNF placement but pt refuses and so will work for home health PT.  Anemia- macrocytic. Anemia panel showed possibly iron deficiency, but paradoxically MCV chronically elevated.  B12 and Folate normal.   Smear - Stomatocytes.  DVT Px- lovenox     LOS: 2 days   [name] 06/26/2011, 10:38 AM

## 2011-06-27 LAB — CBC
HCT: 38.4 % (ref 36.0–46.0)
Hemoglobin: 10.8 g/dL — ABNORMAL LOW (ref 12.0–15.0)
MCHC: 28.1 g/dL — ABNORMAL LOW (ref 30.0–36.0)
RBC: 3.78 MIL/uL — ABNORMAL LOW (ref 3.87–5.11)

## 2011-06-27 LAB — BASIC METABOLIC PANEL
BUN: 15 mg/dL (ref 6–23)
Chloride: 92 mEq/L — ABNORMAL LOW (ref 96–112)
GFR calc Af Amer: >90 mL/min (ref >90)
GFR calc non Af Amer: >90 mL/min (ref >90)
Potassium: 3.7 mEq/L (ref 3.5–5.1)
Sodium: 142 mEq/L (ref 135–145)

## 2011-06-27 MED ORDER — ALBUTEROL SULFATE HFA 108 (90 BASE) MCG/ACT IN AERS
2.0000 | INHALATION_SPRAY | Freq: Three times a day (TID) | RESPIRATORY_TRACT | Status: DC
  Filled 2011-06-27: qty 6.7

## 2011-06-27 MED ORDER — ALBUTEROL SULFATE HFA 108 (90 BASE) MCG/ACT IN AERS
2.0000 | INHALATION_SPRAY | Freq: Three times a day (TID) | RESPIRATORY_TRACT | Status: DC
  Administered 2011-06-27 – 2011-06-28 (×4): 2 via RESPIRATORY_TRACT
  Filled 2011-06-27: qty 6.7

## 2011-06-27 NOTE — Progress Notes (Signed)
Error. Wrong patient.

## 2011-06-27 NOTE — Progress Notes (Signed)
Placed pt on BiPAP with home FFM and circuit and 2L O2 bled in. No complications noted.

## 2011-06-27 NOTE — Progress Notes (Signed)
Subjective: Pt feels better- but SOB with minimal exertion. She wore bipap all night since 11 pm to 8 am Denies any CP, N/V    Objective: Vital signs in last 24 hours: Filed Vitals:   06/26/11 2115 06/27/11 0141 06/27/11 0504 06/27/11 0800  BP:   114/74   Pulse: 92 81 94   Temp:   97.7 F (36.5 C)   TempSrc:   Other (Comment)   Resp: 19 20 22    Height:      Weight:      SpO2: 97% 99%  97%   Weight change:   Intake/Output Summary (Last 24 hours) at 06/27/11 1007 Last data filed at 06/26/11 1600  Gross per 24 hour  Intake    200 ml  Output      0 ml  Net    200 ml   Physical Exam:  General: Vital signs reviewed and noted. Obese, in no distress Head: Normocephalic, atraumatic.  Eyes: PERRLA, EOMI  Neck: no JVD.  Lungs: Poor air movement, prolonged expiration, no wheezes or crackles.  Heart: RRR. S1 and S2 normal without gallop, murmur, or rubs.  Abdomen: BS normoactive. Soft, Nondistended, non-tender. No masses or organomegaly.  Neurologic:  A&O X3, CN II - XII are grossly intact. Motor strength is 5/5 in the all 4 extremities Skin: Dry skin diffusely without rashes or breakdown, unhygienic    Lab Results: Basic Metabolic Panel:  Lab 06/26/11 1610 06/25/11 0646  NA 143 142  K 4.4 4.4  CL 91* 95*  CO2 >45* 43*  GLUCOSE 84 167*  BUN 17 12  CREATININE 0.63 0.56  CALCIUM 9.0 8.5  MG -- --  PHOS -- --   Liver Function Tests:  Lab 06/24/11 1324  AST 16  ALT 17  ALKPHOS 98  BILITOT 0.2*  PROT 7.9  ALBUMIN 3.0*   CBC:  Lab 06/26/11 0640 06/24/11 0447  WBC 18.0* 13.7*  NEUTROABS -- 10.3*  HGB 10.8* 10.6*  HCT 38.2 38.5  MCV 101.9* 104.1*  PLT 351 311   BNP:  Lab 06/24/11 0447  PROBNP 98.5   D-Dimer:  Lab 06/24/11 0502  DDIMER 1.13*   Thyroid Function Tests:  Lab 06/26/11 1000 06/24/11 1324  TSH -- 0.325*  T4TOTAL -- --  FREET4 1.11 --  T3FREE -- --  THYROIDAB -- --   Anemia Panel:  Lab 06/24/11 1324  VITAMINB12 575  FOLATE  >20.0  FERRITIN 38  TIBC 432  IRON 14*  RETICCTPCT 4.0*   Medications: I have reviewed the patient's current medications. Scheduled Meds:    . albuterol  2 puff Inhalation TID  . enoxaparin  40 mg Subcutaneous Q24H  . Fluticasone-Salmeterol  1 puff Inhalation BID  . ipratropium  0.5 mg Nebulization Q6H  . pantoprazole  40 mg Oral Q1200  . predniSONE  40 mg Oral Q breakfast  . simvastatin  5 mg Oral q1800  . sodium chloride  3 mL Intravenous Q12H  . DISCONTD: albuterol  2.5 mg Nebulization Q6H   Continuous Infusions:  PRN Meds:.sodium chloride, acetaminophen, acetaminophen, ondansetron (ZOFRAN) IV, ondansetron, sodium chloride   Assessment/Plan:   61 y/o W with pmh of asthma, OSA, morbid obesity came to Ed with shortness of breath. She was desaturating in 70% on exertion and high 90% on 4 L.   Shortness of breath- Likely multifactorial.  Had [place] fax [place] copy of 2010 PFTs- quality of study so poor that is does not offer any reliable information.  My team  is suspicious that her primary process is a combination of obesity hypoventilation and obstructive lung dz (COPD most likely per my attending's assessment, smoking history, bullae on CT).  OSA possible as well.  She is improved after BIPAP.  -Continue BIPAP, continue home BIPAP on discharge -Advair BID, start ipratropium inhaler QID -Cont prednisone. -Scheduled MDI spacer albuterol only -Will need to be discharged on Advair BID, Ipratropium inhaler QID, Albuterol PRN with spacer from RT. - Talked with RT- Dora to come and give space and give inhaler teaching today. - Says can't do PFT's - This is chronic issue for years- which won't be fixed in this hospital stay. Needs close out-pt management with PCP and possible Pulmonologist ( with PFT's in future once she can tolerate the study).  _ Out-pt appt are hard for her due to issues with SOB and rides. Needs Social worker to help for ride to clinics and also Home health for  possible PT and some help with ADL's - Likely D/C tomorrow after arrangements for above.  Diastolic CHF- Does not seem volume overloaded per my team's assessment today.  Discontinued Lasix.    OSA- BIPAP qhs   Tremors and palpitation Secondary to albuterol most likely. TSH borderline low.  T4 1.1 WNL.  HLD- continue statin   Deconditioning- patient is unable to perform ADLs like bathing, cooking etc.  PT recs- SNF placement but pt refuses and so will work for home health PT- as #1  Anemia- macrocytic. Anemia panel showed possibly AOCD, but paradoxically MCV chronically elevated.  B12 and Folate normal.  Doesn't seem like Iron Deficiency as the sole reason. MCV increased due to Stomatocytes. Smear - Stomatocytes.  DVT Px- lovenox     LOS: 3 days   Amrie Gurganus 06/27/2011, 10:07 AM

## 2011-06-28 LAB — BASIC METABOLIC PANEL
BUN: 15 mg/dL (ref 6–23)
CO2: 41 mEq/L (ref 19–32)
Chloride: 92 mEq/L — ABNORMAL LOW (ref 96–112)
GFR calc non Af Amer: >90 mL/min (ref >90)
Glucose, Bld: 90 mg/dL (ref 70–99)
Potassium: 4.1 mEq/L (ref 3.5–5.1)
Sodium: 139 mEq/L (ref 135–145)

## 2011-06-28 LAB — BLOOD GAS, ARTERIAL
Bicarbonate: 41.2 mEq/L — ABNORMAL HIGH (ref 20.0–24.0)
Bicarbonate: 42.5 mEq/L — ABNORMAL HIGH (ref 20.0–24.0)
Patient temperature: 98.6
Patient temperature: 98.6
TCO2: 44 mmol/L (ref 0–100)
TCO2: 45.7 mmol/L (ref 0–100)
pCO2 arterial: 89.5 mmHg (ref 35.0–45.0)
pCO2 arterial: 102 mmHg (ref 35.0–45.0)
pH, Arterial: 7.285 — ABNORMAL LOW (ref 7.350–7.400)
pH, Arterial: 7.243 — ABNORMAL LOW (ref 7.350–7.400)
pO2, Arterial: 47.2 mmHg — ABNORMAL LOW (ref 80.0–100.0)
pO2, Arterial: 57.7 mmHg — ABNORMAL LOW (ref 80.0–100.0)

## 2011-06-28 LAB — GLUCOSE, CAPILLARY: Glucose-Capillary: 233 mg/dL — ABNORMAL HIGH (ref 70–99)

## 2011-06-28 MED ORDER — IPRATROPIUM BROMIDE HFA 17 MCG/ACT IN AERS
2.0000 | INHALATION_SPRAY | Freq: Four times a day (QID) | RESPIRATORY_TRACT | Status: DC
  Administered 2011-06-28 – 2011-06-29 (×2): 2 via RESPIRATORY_TRACT
  Filled 2011-06-28: qty 12.9

## 2011-06-28 MED ORDER — WHITE PETROLATUM GEL
Status: AC
  Administered 2011-06-28: 20:00:00
  Filled 2011-06-28: qty 5

## 2011-06-28 MED ORDER — IPRATROPIUM BROMIDE HFA 17 MCG/ACT IN AERS: 2.0000 | INHALATION_SPRAY | Freq: Four times a day (QID) | RESPIRATORY_TRACT | Status: DC

## 2011-06-28 MED ORDER — ALBUTEROL SULFATE HFA 108 (90 BASE) MCG/ACT IN AERS
2.0000 | INHALATION_SPRAY | Freq: Four times a day (QID) | RESPIRATORY_TRACT | Status: DC | PRN
  Administered 2011-06-29: 2 via RESPIRATORY_TRACT
  Filled 2011-06-28: qty 6.7

## 2011-06-28 MED ORDER — PREDNISONE 10 MG PO TABS: ORAL_TABLET | ORAL | Status: DC

## 2011-06-28 MED ORDER — FLUTICASONE-SALMETEROL 115-21 MCG/ACT IN AERO: 2.0000 | INHALATION_SPRAY | Freq: Two times a day (BID) | RESPIRATORY_TRACT | Status: DC

## 2011-06-28 MED ORDER — ALBUTEROL SULFATE HFA 108 (90 BASE) MCG/ACT IN AERS: 2.0000 | INHALATION_SPRAY | Freq: Four times a day (QID) | RESPIRATORY_TRACT | Status: DC | PRN

## 2011-06-28 MED ORDER — GUAIFENESIN-DM 100-10 MG/5ML PO SYRP
5.0000 mL | ORAL_SOLUTION | ORAL | Status: DC | PRN
  Administered 2011-06-28: 5 mL via ORAL
  Filled 2011-06-28 (×2): qty 5

## 2011-06-28 NOTE — Progress Notes (Addendum)
Clinical Social Work Department BRIEF PSYCHOSOCIAL ASSESSMENT 06/28/2011  Patient:  [name], [name]     Account Number:  [id number]     Admit date:  06/24/2011  Clinical Social Worker:  Lourdes Sledge  Date/Time:  06/28/2011 10:42 AM  Referred by:  Physician  Date Referred:  06/28/2011 Referred for  Transportation assistance   Other Referral:   Interview type:  Patient Other interview type:    PSYCHOSOCIAL DATA Living Status:  WITH ADULT CHILDREN Admitted from facility:   Level of care:   Primary support name:  Teressa Lower Primary support relationship to patient:  SIBLING Degree of support available:   Adequate-able to provide transportation from time to time.    CURRENT CONCERNS Current Concerns  Other - See comment   Other Concerns:   Patient has limited access to transportation to her medical appointments.    SOCIAL WORK ASSESSMENT / PLAN Covering CSW completed assessment with pt. Pt states she has limited access to transportation. Pt states she is not willing to take Medicaid transportation as she has used it in the past and has had to wait hours for transportation. CSW provided pt with a SCAT application for transportation. CSW has also left the part of the application for the physician to sign in the wall-a-roo. CSW also contacted [employer] and provided pt with their contact number so that she can follow up and receive an application from them. Pt states her sister will be able to provide her with transportation to her appointment on July 06, 2011 however unsure of future appointments. CSW encouraged pt to complete SCAT application asap and mail back to them. CSW also encouraged pt to consider using Medicaid Transportation services again as pt has limited options at this point.   Assessment/plan status:  Information/Referral to [employer] Other assessment/ plan:   Information/referral to community resources:   CSW provided pt with a Electronics engineer.    PATIENT'S/FAMILY'S RESPONSE TO PLAN OF CARE: Pt laying in bed presents alert and oriented. Pt states she will apply for SCAT however will not consider using Medicaid services. Pt aware that SCAT application process can take a few weeks and is encouraged to mail-in the application this week. Pt appreciative of CSW assistance. Pt also states she will need transportation home. CSW will facilitate with a non emergency ambulance when pt stable to go today.

## 2011-06-28 NOTE — Progress Notes (Addendum)
Met with pt re d/c needs, order originally for HHPT/OT however pt payor source will not cover this so we have requested HHRN for eval and assessment in the home. Pt currently on home O2, which she gets from [facility], and has used [facility] for [facility] in the past, will ask [facility] to provide [facility] at time of d/c. Johny Shock RN MPH Case Manager [phone]     CARE MANAGEMENT NOTE 06/29/2011  Patient:  [name], [name]   Account Number:  [id number]  Date Initiated:  06/28/2011  Documentation initiated by:  Elias Dennington  Subjective/Objective Assessment:   Order for HHPT and HHOT.     Action/Plan:   Noted that pt has Medicaid as payor source and does not meet criteria for HHPT and HHOT, will ask [facility] to provide [facility] for eval and assessment.   Anticipated DC Date:  06/29/2011   Anticipated DC Plan:  HOME W HOME HEALTH SERVICES         [facility] Choice  HOME HEALTH   Choice offered to / List presented to:  [name]        HH arranged  HH-1 RN      Status of service:   Medicare Important Message given?   (If response is "NO", the following Medicare IM given date fields will be blank) Date Medicare IM given:   Date Additional Medicare IM given:    Discharge Disposition:    Per UR Regulation:    If discussed at Long Length of Stay Meetings, dates discussed:    Comments:

## 2011-06-28 NOTE — Progress Notes (Signed)
Internal Medicine Attending  Date: 06/28/2011  Patient name: [name] Medical record number: [id number] Date of birth: Apr 24, 1950 Age: 61 y.o. Gender: female  I saw and evaluated the patient. I reviewed the resident's note by Dr. Yaakov Guthrie and I agree with the resident's findings and plans as documented in his progress note.  [name] desaturated today while on 2 L nasal cannula. This is her home setting and may not be enough to keep her appropriately oxygenated. We have titrated up her oxygen to 3 L per minute at rest and are repeating the arterial blood gas to assure she does not retain CO2. We are also hopeful that her PaO2 is in the 55-60 range with this adjustment. Her A-a gradient earlier today was only 14 suggesting that the majority of her problem has to do with a central drive rather than a parenchymal defect. We continue to stress the importance of using the BiPAP at night and anytime she sleeps during the day with the goal of trying to keep her PaCO2 as low as possible while she is not on the BiPAP. I agree with the housestaff's plans to make the above adjustments and we suspect she will be ready for discharge in the morning.

## 2011-06-28 NOTE — Progress Notes (Signed)
Subjective:   Wore BIPAP for 8 hrs last night.  Has not had albuterol since last night and requests it this morning for SOB.  Dose scheduled for 8am already.      Objective: Vital signs in last 24 hours: Filed Vitals:   06/27/11 2053 06/27/11 2126 06/28/11 0300 06/28/11 0820  BP:  107/69 120/81   Pulse: 91 95 100   Temp:  98.6 F (37 C) 97.7 F (36.5 C)   TempSrc:  Oral Axillary   Resp: 19 20 19    Height:      Weight:      SpO2: 96% 88% 92% 92%   Weight change:   Intake/Output Summary (Last 24 hours) at 06/28/11 0829 Last data filed at 06/28/11 0500  Gross per 24 hour  Intake    600 ml  Output    903 ml  Net   -303 ml   Physical Exam:  General: Vital signs reviewed and noted. Obese, in no distress  Head: Normocephalic, atraumatic.  Eyes: PERRLA, EOMI  Neck: no JVD.  Lungs:Air movement improved since admission. No wheezes or crackles.  Heart: RRR. S1 and S2 normal without gallop, murmur, or rubs.  Neurologic:  A&O X3, CN II - XII are grossly intact.  Skin: Dry skin diffusely without rashes or breakdown, unhygienic    Lab Results: Basic Metabolic Panel:  Lab 06/28/11 1191 06/27/11 0946  NA 139 142  K 4.1 3.7  CL 92* 92*  CO2 41* 41*  GLUCOSE 90 145*  BUN 15 15  CREATININE 0.58 0.56  CALCIUM 8.6 8.7  MG -- --  PHOS -- --   Liver Function Tests:  Lab 06/24/11 1324  AST 16  ALT 17  ALKPHOS 98  BILITOT 0.2*  PROT 7.9  ALBUMIN 3.0*   CBC:  Lab 06/27/11 0946 06/26/11 0640 06/24/11 0447  WBC 16.2* 18.0* --  NEUTROABS -- -- 10.3*  HGB 10.8* 10.8* --  HCT 38.4 38.2 --  MCV 101.6* 101.9* --  PLT 280 351 --   BNP:  Lab 06/24/11 0447  PROBNP 98.5   D-Dimer:  Lab 06/24/11 0502  DDIMER 1.13*   Thyroid Function Tests:  Lab 06/26/11 1000 06/24/11 1324  TSH -- 0.325*  T4TOTAL -- --  FREET4 1.11 --  T3FREE -- --  THYROIDAB -- --   Anemia Panel:  Lab 06/24/11 1324  VITAMINB12 575  FOLATE >20.0  FERRITIN 38  TIBC 432  IRON 14*    RETICCTPCT 4.0*   Urine Drug Screen: Drugs of Abuse     Component Value Date/Time   LABOPIA NONE DETECTED 02/06/2007 0950   COCAINSCRNUR NONE DETECTED 02/06/2007 0950   LABBENZ NONE DETECTED 02/06/2007 0950   AMPHETMU NONE DETECTED 02/06/2007 0950   THCU NONE DETECTED 02/06/2007 0950   LABBARB  Value: NONE DETECTED        DRUG SCREEN FOR MEDICAL PURPOSES ONLY.  IF CONFIRMATION IS NEEDED FOR ANY PURPOSE, NOTIFY LAB WITHIN 5 DAYS. 02/06/2007 0950     Micro Results: Recent Results (from the past 240 hour(s))  TECHNOLOGIST SMEAR REVIEW     Status: Normal   Collection Time   06/25/11  3:38 PM      Component Value Range Status Comment   Path Review STOMATOCYTES   Final POLYCHROMASIA PRESENT   Medications: I have reviewed the patient's current medications. Scheduled Meds:   . albuterol  2 puff Inhalation TID  . enoxaparin  40 mg Subcutaneous Q24H  . Fluticasone-Salmeterol  1 puff Inhalation  BID  . pantoprazole  40 mg Oral Q1200  . predniSONE  40 mg Oral Q breakfast  . simvastatin  5 mg Oral q1800  . sodium chloride  3 mL Intravenous Q12H  . DISCONTD: albuterol  2 puff Inhalation TID  . DISCONTD: albuterol  2.5 mg Nebulization Q6H  . DISCONTD: ipratropium  0.5 mg Nebulization Q6H   Continuous Infusions:  PRN Meds:.sodium chloride, acetaminophen, acetaminophen, ondansetron (ZOFRAN) IV, ondansetron, sodium chloride  Assessment/Plan:   61 y/o W with pmh of asthma, OSA, morbid obesity came to Ed with shortness of breath. She was desaturating in 70% on exertion and high 90% on 4 L.   Shortness of breath- Likely multifactorial. Had [place] fax [place] copy of 2010 PFTs- quality of study so poor that is does not offer any reliable information. My team is suspicious that her primary process is a combination of obesity hypoventilation and obstructive lung dz (COPD most likely per my attending's assessment, smoking history, bullae on CT). OSA possible as well. She is improved after BIPAP.  -Continue  BIPAP, continue home BIPAP on discharge  -Advair BID, ipratropium inhaler QID  -Cont prednisone.  -Scheduled MDI spacer albuterol only  -Will need to be discharged on Advair BID, Ipratropium inhaler QID, Albuterol PRN with spacer from RT.  - Says can't do PFT's  - This is chronic issue for years- which won't be fixed in this hospital stay. Needs close out-pt management with PCP and Pulmonologist ( with PFT's in future once she can tolerate the study).  - Out-pt appt are hard for her due to issues with SOB and rides. Needs Social worker to help for ride to clinics and also Home health for possible PT and some help with ADL's  - Likely D/C today after arrangements for above.   Diastolic CHF- Does not seem volume overloaded per my team's assessment today. Discontinued Lasix.   OSA- BIPAP qhs   Tremors and palpitation Secondary to albuterol most likely. TSH borderline low. T4 1.1 WNL .  HLD- continue statin   Deconditioning- patient is unable to perform ADLs like bathing, cooking etc. PT recs- SNF placement but pt refuses and so will work for home health PT/OT- as #1   Anemia- macrocytic. Anemia panel showed possibly AOCD, but paradoxically MCV chronically elevated. B12 and Folate normal. Doesn't seem like Iron Deficiency as the sole reason. MCV increased due to Stomatocytes.  Smear - Stomatocytes.  -Will consider PO iron on discharge pending discussion with my team  DVT Px- lovenox    LOS: 4 days   Yaakov Guthrie, BRAD 06/28/2011, 8:29 AM

## 2011-06-28 NOTE — Progress Notes (Signed)
1630 Patient oxygen saturation 91%on 3 liters nasal cannula

## 2011-06-28 NOTE — Progress Notes (Signed)
Physical Therapy Treatment Patient Details Name: [name] MRN: [id number] DOB: 1950-07-08 Today's Date: 06/28/2011  PT Assessment/Plan  PT - Assessment/Plan Comments on Treatment Session: Pt. reports plans to d/c home today. Son lives with her but unable to assist her physically, pt reports son has disabilities but he drives her around. Pt. hesitant to participate in therapy stating "any moving around makes me short of breath". Pt. willing to sit up in chair via stand pivot transfer. Cues for deep breathing. PT Frequency: Min 3X/week Follow Up Recommendations: Home health PT Equipment Recommended: None recommended by PT PT Goals  Acute Rehab PT Goals PT Goal Formulation: With patient Time For Goal Achievement: 7 days PT Goal: Supine/Side to Sit - Progress: Progressing toward goal PT Goal: Sit to Stand - Progress: Progressing toward goal PT Goal: Stand to Sit - Progress: Progressing toward goal PT Transfer Goal: Bed to Chair/Chair to Bed - Progress: Progressing toward goal PT Goal: Stand - Progress: Progressing toward goal  PT Treatment Precautions/Restrictions  Precautions Precautions: Fall Mobility (including Balance) Bed Mobility Bed Mobility: Yes Supine to Sit: 5: Supervision;HOB elevated (Comment degrees);With rails Supine to Sit Details (indicate cue type and reason): supervision for safety due to patient stating "I can barely make it from bed to commode". Transfers Transfers: Yes Sit to Stand: From bed;With upper extremity assist;Other (comment) (min guard A) Sit to Stand Details (indicate cue type and reason): patient impulsive with stand. Min guard (A) for safety. Pt. refused gait belt.  Stand to Sit: Other (comment);To bed;With armrests;With upper extremity assist (min guard (A)) Stand to Sit Details: min guard for safety and to ensure control of descent. pt. demonstrated proper technique and control of descent.  Stand Pivot Transfers: Other (comment);With armrests  (min guard (A)) Stand Pivot Transfer Details (indicate cue type and reason): patient hesistant to transfer to chair stating "I can barely make it from bed to commode". After encouragment pt. then impulsive with transfer, min guard (A) for safety. Pt. used bed rail and chari armrest for support during pivot.    End of Session PT - End of Session Activity Tolerance: Patient limited by fatigue Patient left: in chair;with call bell in reach General Behavior During Session: [facility] for tasks performed Cognition: [facility] for tasks performed  Ardyth Gal SPTA 06/28/2011, 3:37 PM

## 2011-06-28 NOTE — Progress Notes (Signed)
1840 Blood Gas results called to Dr. Yaakov Guthrie also spoke with him regarding Bipap.

## 2011-06-28 NOTE — Progress Notes (Signed)
Kameren Pargas, PTA [phone] 06/28/2011  

## 2011-06-28 NOTE — Progress Notes (Signed)
1300 Patient got up from bedside commode  Became  SOB  Oxygen saturation  72% on 2 liters Nasal cannula. Respiratory therapy  In to see patient. Dr. Yaakov Guthrie made aware.

## 2011-06-29 DIAGNOSIS — I509 Heart failure, unspecified: Secondary | ICD-10-CM

## 2011-06-29 DIAGNOSIS — I5032 Chronic diastolic (congestive) heart failure: Secondary | ICD-10-CM

## 2011-06-29 LAB — MRSA PCR SCREENING: MRSA by PCR: NEGATIVE

## 2011-06-29 NOTE — Progress Notes (Signed)
Subjective: Breathing comfortably this morning.  Satting 91-95% on 3L O2.   Objective: Vital signs in last 24 hours: Filed Vitals:   06/29/11 0743 06/29/11 0746 06/29/11 0800 06/29/11 1000  BP:      Pulse:      Temp:   98.5 F (36.9 C) 98 F (36.7 C)  TempSrc:   Oral   Resp:      Height:      Weight:      SpO2: 94% 93%     Weight change:  No intake or output data in the 24 hours ending 06/29/11 1433 Physical Exam:  General: Vital signs reviewed and noted. Obese, in no distress  Head: Normocephalic, atraumatic.  Eyes: PERRLA, EOMI  Neck: no JVD.  Lungs:Air movement improved since admission. No wheezes or crackles.  Heart: RRR. S1 and S2 normal without gallop, murmur, or rubs.  Neurologic:  A&O X3, CN II - XII are grossly intact.  Skin: Dry skin diffusely without rashes or breakdown    Lab Results: Basic Metabolic Panel:  Lab 06/28/11 1610 06/27/11 0946  NA 139 142  K 4.1 3.7  CL 92* 92*  CO2 41* 41*  GLUCOSE 90 145*  BUN 15 15  CREATININE 0.58 0.56  CALCIUM 8.6 8.7  MG -- --  PHOS -- --   Liver Function Tests:  Lab 06/24/11 1324  AST 16  ALT 17  ALKPHOS 98  BILITOT 0.2*  PROT 7.9  ALBUMIN 3.0*   CBC:  Lab 06/27/11 0946 06/26/11 0640 06/24/11 0447  WBC 16.2* 18.0* --  NEUTROABS -- -- 10.3*  HGB 10.8* 10.8* --  HCT 38.4 38.2 --  MCV 101.6* 101.9* --  PLT 280 351 --   BNP:  Lab 06/24/11 0447  PROBNP 98.5   D-Dimer:  Lab 06/24/11 0502  DDIMER 1.13*   CBG:  Lab 06/27/11 1133  GLUCAP 233*   Thyroid Function Tests:  Lab 06/26/11 1000 06/24/11 1324  TSH -- 0.325*  T4TOTAL -- --  FREET4 1.11 --  T3FREE -- --  THYROIDAB -- --   Anemia Panel:  Lab 06/24/11 1324  VITAMINB12 575  FOLATE >20.0  FERRITIN 38  TIBC 432  IRON 14*  RETICCTPCT 4.0*     Micro Results: Recent Results (from the past 240 hour(s))  TECHNOLOGIST SMEAR REVIEW     Status: Normal   Collection Time   06/25/11  3:38 PM      Component Value Range Status  Comment   Path Review STOMATOCYTES   Final POLYCHROMASIA PRESENT  MRSA PCR SCREENING     Status: Normal   Collection Time   06/28/11 11:21 PM      Component Value Range Status Comment   MRSA by PCR NEGATIVE  NEGATIVE  Final    Medications: I have reviewed the patient's current medications. Scheduled Meds:   . white petrolatum      . DISCONTD: albuterol  2 puff Inhalation TID  . DISCONTD: enoxaparin  40 mg Subcutaneous Q24H  . DISCONTD: Fluticasone-Salmeterol  1 puff Inhalation BID  . DISCONTD: ipratropium  2 puff Inhalation QID  . DISCONTD: pantoprazole  40 mg Oral Q1200  . DISCONTD: predniSONE  40 mg Oral Q breakfast  . DISCONTD: simvastatin  5 mg Oral q1800  . DISCONTD: sodium chloride  3 mL Intravenous Q12H   Continuous Infusions:  PRN Meds:.DISCONTD: sodium chloride, DISCONTD: acetaminophen, DISCONTD: acetaminophen, DISCONTD: albuterol, DISCONTD: guaiFENesin-dextromethorphan, DISCONTD: ondansetron (ZOFRAN) IV, DISCONTD: ondansetron, DISCONTD: sodium chloride  Assessment/Plan: Principal Problem:  *  Asthma Active Problems:  SLEEP APNEA, OBSTRUCTIVE  Chronic diastolic heart failure  Obesity hypoventilation syndrome  61 y/o W with pmh of asthma, OSA, morbid obesity came to Ed with shortness of breath. She was desaturating in 70% on exertion and high 90% on 4 L.   Shortness of breath- Likely multifactorial. Had [place] fax [place] copy of 2010 PFTs- quality of study so poor that is does not offer any reliable information. My team is suspicious that her primary process is a combination of obesity hypoventilation and obstructive lung dz (COPD most likely per my attending's assessment, smoking history, bullae on CT). OSA possible as well. She is improved after BIPAP.  PO2 57 yesterday on 3L O2 after was too low on 2L O2.  PCO2 increased on 3L O2, as would be expected.  Will discharge on home O2 increased to 3L per minute.   -Continue BIPAP, continue home BIPAP on discharge  -Advair BID,  ipratropium inhaler QID  -Cont prednisone, taper on discharge -Will need to be discharged on Advair BID, Ipratropium inhaler QID, Albuterol PRN with spacer from RT.  - Says can't do PFT's  - This is chronic issue for years- which won't be fixed in this hospital stay. Needs close out-pt management with PCP and Pulmonologist ( with PFT's in future once she can tolerate the study).  - Out-pt appt are hard for her due to issues with SOB and rides. Needs Social worker to help for ride to clinics. - Likely D/C today after arrangements for above.   Diastolic CHF- Does not seem volume overloaded per my team's assessment today. Discontinued Lasix.   OSA- BIPAP qhs   Tremors and palpitation Secondary to albuterol most likely. TSH borderline low. T4 1.1 WNL  .  HLD- continue statin   Deconditioning- patient is unable to perform ADLs like bathing, cooking etc. PT recs- SNF placement but pt refuses and so will work for home health PT/OT- as #1   DVT Px- lovenox    LOS: 5 days   Yaakov Guthrie, BRAD 06/29/2011, 2:33 PM

## 2011-06-29 NOTE — Progress Notes (Signed)
Patient transferred to [id number] via wheelchair, oxygen via nasal cannula at 3liters, patient denies pain at transfer.  Report called to [facility] with transport at 2300.  Macarthur Critchley, RN

## 2011-06-29 NOTE — Progress Notes (Signed)
Internal Medicine Attending  Date: 06/29/2011  Patient name: [name] Medical record number: [id number] Date of birth: [birth year] Age: 61 y.o. Gender: female  I saw and evaluated the patient. I reviewed the resident's note by Dr. Yaakov Guthrie and I agree with the resident's findings and plans as documented in his progress note.  [name] was not appropriately oxygenated on her baseline 2 L per minute via nasal cannula. Therefore we increased her oxygen to 3 L per minute by nasal cannula. With this, her PaO2 increased to 57 and her PaCO2 also increased slightly. When making the calculations this still was in the chronic respiratory acidosis range. On 3 L of oxygen overnight she did well having slept better than she had in a long time. This morning, she looked very good and was alert and awake without complaints ready for discharge home. I agree with the housestaff's plan to discharge [name] home today on 3 L of oxygen per minute via nasal cannula. She was instructed to use her BiPAP at all times when she was sleeping whether it be at night or during naps in the daytime. She will need reassessment of her oxygen needs in approximately one month. In the meantime, she will be maintained on an inhaler regimen appropriate for her chronic obstructive pulmonary disease.

## 2011-07-04 NOTE — Discharge Summary (Signed)
[facility] Discharge Note  Name: [name] MRN: [id number] DOB: [birth year] 61 y.o.  Date of Admission: 06/24/2011  4:05 AM Date of Discharge: 06/29/2011 Attending Physician: Doneen Poisson  Discharge Diagnosis:  Obstructive  Lung Disease: Likely Asthma Likely Obesity Hypoventilation Syndrome SLEEP APNEA, OBSTRUCTIVE Chronic diastolic heart failure   Discharge Medications: Medication List  As of 07/04/2011  4:34 PM   TAKE these medications         albuterol 108 (90 BASE) MCG/ACT inhaler   Commonly known as: PROVENTIL HFA;VENTOLIN HFA   Inhale 2 puffs into the lungs every 6 (six) hours as needed for shortness of breath.      aspirin EC 81 MG tablet   Take 81 mg by mouth daily.      fluticasone-salmeterol 115-21 MCG/ACT inhaler   Commonly known as: ADVAIR HFA   Inhale 2 puffs into the lungs 2 (two) times daily.      ipratropium 17 MCG/ACT inhaler   Commonly known as: ATROVENT HFA   Inhale 2 puffs into the lungs 4 (four) times daily.      pravastatin 10 MG tablet   Commonly known as: PRAVACHOL   Take 10 mg by mouth daily.      predniSONE 10 MG tablet   Commonly known as: DELTASONE   Take 4 pills by mouth daily for 3 days, then 3 pills daily for 3 days, then 2 pills daily for 3 days, then 1 pill daily for 3 days, thenstop            Disposition and follow-up:   Ms.[name] was discharged from [facility] in stable condition.    Follow-up Appointments: Follow-up Information    Follow up with Barbaraann Share, MD on 07/09/2011. (11:30pm)    Contact information:   [address] [employer] [place] [address] [phone]       Follow up with [facility], MD on 07/06/2011. (1:30pm.  Try [phone] telephone number.  )    Contact information:   [address] [place] [address] [phone]         Discharge Orders    Future Appointments: Provider:  Department: Dept Phone: Center:   07/09/2011 11:30 AM Barbaraann Share, MD [facility] [phone] None     Future Orders Please Complete By Expires   Diet general      Diet - low sodium heart healthy      Activity as tolerated - No restrictions      Call MD for:  difficulty breathing, headache or visual disturbances      Call MD for:  extreme fatigue      Discharge instructions      Comments:   Please use your BIPAP each night   Increase activity slowly      Call MD for:  difficulty breathing, headache or visual disturbances      Call MD for:  persistant dizziness or light-headedness        Procedures Performed:  Dg Chest 2 View  06/24/2011  *RADIOLOGY REPORT*  Clinical Data: Shortness of breath; dyspnea.  CHEST - 2 VIEW  Comparison: Chest radiograph performed 07/11/2007  Findings: The lungs are well-aerated.  Vascular congestion is noted, with mildly increased interstitial markings, raising concern for mild interstitial edema.  There is no evidence of focal opacification, pleural effusion or pneumothorax.  The heart is borderline enlarged.  No acute osseous abnormalities are seen.  IMPRESSION:  Vascular congestion and borderline cardiomegaly, with mildly increased interstitial markings, raising concern for mild interstitial edema.  Original Report Authenticated By: Tonia Ghent, M.D.   Ct Angio Chest W/cm &/or Wo Cm  06/24/2011  *RADIOLOGY REPORT*  Clinical Data: Shortness of breath  CT ANGIOGRAPHY CHEST  Technique:  Multidetector CT imaging of the chest using the standard protocol during bolus administration of intravenous contrast. Multiplanar reconstructed images including MIPs were obtained and reviewed to evaluate the vascular anatomy.  Contrast: OMNIPAQUE IOHEXOL 350 MG/ML IV SOLN  Comparison: Chest radiographs dated 06/24/2011  Findings: No evidence of pulmonary embolism.  Patchy ground-glass opacities, likely reflecting interstitial edema, with a trace left pleural effusion.   Underlying moderate emphysematous changes.  Mild cardiomegaly.  No pericardial effusion.  Coronary atherosclerosis.  Atherosclerotic calcifications of the aortic arch.  Small mediastinal/right hilar lymph nodes which do not meet pathologic CT size criteria.  No suspicious axillary lymphadenopathy.  Visualized thyroid is unremarkable.  Mild degenerative changes of the visualized thoracolumbar spine.  IMPRESSION: No evidence of pulmonary embolism.  Mild cardiomegaly with suspected interstitial edema and a trace left pleural effusion.  Underlying moderate emphysematous changes.  Original Report Authenticated By: Charline Bills, M.D.   Admission HPI:  Patient is a 61 y.o. female with a PMHx of asthma , obstructive sleep apnea, morbid obesity comes to the Ed with c/o shortness of breath, palpitation and tremors. Her symptoms have been ongoing for past few months but got worse progressively over the weekend. She started using more albuterol for shortness of breath this past week. It did help her dyspnea but she also felt more palpitation and tremors. The night before admission, she got acutely short of breath and called the EMS. She felt better immediately after nebulizer treatment and returned to her baseline. She also c/o of fatigue and sleepiness all day. She sleeps multiple times during the day and then is unable to sleep well at night. She tries to use her CPAP every night but is not able to keep it on for full 6 hours. She has sick contacts in form of her grand son who visited her last week and had a cold. She herself denies any such symptoms like fever, chills, rash, sore throat. She does have cough with any sputum production that does not have any pattern of occurence but gets better with albuterol. No recent travel, change in medications, edema of leg,chest pain or hemoptysis   Hospital Course by problem list:   Obstructive  Lung Disease and Obesity Hypoventilation Syndrome:  Patient has long history  of respiratory problems.  Was admitted for SOB, palpitations, and tremors for the past few months gradually worsening.  Admission ABG showed a chronic, well-compensated, respiratory acidosis with a pCO2 of 96.7 mmHg.  Likely her presentation represents gradual worsening of a chronic problem.  Patient reported inconsistent BIPAP use at home, and it was felt central hypoventilation was most likely cause of her troubles (likely obesity hypoventilation syndrome).  Patient was given nightly BIPAP in hospital and instructed to use BIPAP nightly and during all daytime naps at home after discharge.  Once it was felt patient was stable for discharge, ABG was checked on her home 2L per minute O2 by nasal cannula, her previous home dose.  This showed pH 7.285, pCO2 89.5, pO2 47.2.  Since pO2 was lower than 55, ABG rechecked on 3L O2.  This showed improved pO2 of 57.7 mmHg, but worsened pCO2 102 mmHg.  This tradeoff of pO2 and pCO2 is  to be expected and is unavoidable due to the nature of her disease.  Patient recommended to use 3L per minute O2 via nasal cannula at home after discharge.  At followup with her PCP and pulmonology, adherence to BIPAP (every night while sleeping plus all daytime naps) and her new inhaler regimen should be assessed.   Also has history of asthma since her 44s.  Our suspicion is that chronic inflammation from asthma for so long has created enough irreversible damage that she now has COPD as a result.  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  We obtained 2010 PFTs performed at [facility], but these were too poor quality of a study to offer any reliable information.  Although we felt that her SOB before this admission was worsening of a chronic problem, she was treated for an acute COPD/asthma flair as there may have been an aspect of this as well.  Steroids prescribed in hospital with taper on discharge.  Also treated with frequent nebulizers in hospital plus Advair BID.  At home was  just using Advair once per day and albuterol inhaler q3H.  Regimen on discharge is to be Advair BID, Ipratropium inhaler QID (pt intolerant of spiriva in past), Albuterol inhaler PRN.  SLEEP APNEA, OBSTRUCTIVE: BIPAP at home whenever sleeping or taking a nap.    Discharge Vitals:  BP 123/67  Pulse 87  Temp(Src) 98 F (36.7 C) (Oral)  Resp 24  Ht 5\' 5"  (1.651 m)  Wt 261 lb 0.4 oz (118.4 kg)  BMI 43.44 kg/m2  SpO2 93%  Discharge Labs:  Admission on 06/24/2011, Discharged on 06/29/2011  Component Date Value Range Status  . WBC (K/uL) 06/24/2011 13.7* 4.0-10.5 Final  . RBC (MIL/uL) 06/24/2011 3.70* 3.87-5.11 Final  . Hemoglobin (g/dL) 40/98/1191 47.8* 29.5-62.1 Final  . HCT (%) 06/24/2011 38.5  36.0-46.0 Final  . MCV (fL) 06/24/2011 104.1* 78.0-100.0 Final  . MCH (pg) 06/24/2011 28.6  26.0-34.0 Final  . MCHC (g/dL) 30/86/5784 69.6* 29.5-28.4 Final  . RDW (%) 06/24/2011 15.0  11.5-15.5 Final  . Platelets (K/uL) 06/24/2011 311  150-400 Final  . Neutrophils Relative (%) 06/24/2011 75  43-77 Final  . Neutro Abs (K/uL) 06/24/2011 10.3* 1.7-7.7 Final  . Lymphocytes Relative (%) 06/24/2011 18  12-46 Final  . Lymphs Abs (K/uL) 06/24/2011 2.5  0.7-4.0 Final  . Monocytes Relative (%) 06/24/2011 6  3-12 Final  . Monocytes Absolute (K/uL) 06/24/2011 0.8  0.1-1.0 Final  . Eosinophils Relative (%) 06/24/2011 1  0-5 Final  . Eosinophils Absolute (K/uL) 06/24/2011 0.1  0.0-0.7 Final  . Basophils Relative (%) 06/24/2011 0  0-1 Final  . Basophils Absolute (K/uL) 06/24/2011 0.0  0.0-0.1 Final  . Sodium (mEq/L) 06/24/2011 141  135-145 Final  . Potassium (mEq/L) 06/24/2011 4.1  3.5-5.1 Final  . Chloride (mEq/L) 06/24/2011 93* 96-112 Final  . CO2 (mEq/L) 06/24/2011 41* 19-32 Final   Comment: REPEATED TO VERIFY                          CRITICAL RESULT CALLED TO, READ BACK BY AND VERIFIED WITH:                          CRANSTON K,RN 06/24/11 0539 WAYK  . Glucose, Bld (mg/dL) 13/24/4010 272* 53-66  Final  . BUN (mg/dL) 44/06/4740 10  5-95 Final  . Creatinine, Ser (mg/dL) 63/87/5643 3.29  5.18-8.41 Final  . Calcium (mg/dL) 66/09/3014  9.0  8.4-10.5 Final  . GFR calc non Af Amer (mL/min) 06/24/2011 >90  >90 Final  . GFR calc Af Amer (mL/min) 06/24/2011 >90  >90 Final   Comment:                                 The eGFR has been calculated                          using the CKD EPI equation.                          This calculation has not been                          validated in all clinical                          situations.                          eGFR's persistently                          <90 mL/min signify                          possible Chronic Kidney Disease.  . Pro B Natriuretic peptide (BNP) (pg/mL) 06/24/2011 98.5  0-125 Final  . Troponin i, poc (ng/mL) 06/24/2011 0.01  0.00-0.08 Final  . Comment 3  06/24/2011          Final   Comment: Due to the release kinetics of cTnI,                          a negative result within the first hours                          of the onset of symptoms does not rule out                          myocardial infarction with certainty.                          If myocardial infarction is still suspected,                          repeat the test at appropriate intervals.  Marland Kitchen D-Dimer, Quant (ug/mL-FEU) 06/24/2011 1.13* 0.00-0.48 Final   Comment:                                 AT THE INHOUSE ESTABLISHED CUTOFF                          VALUE OF 0.48 ug/mL FEU,                          THIS ASSAY HAS BEEN DOCUMENTED  IN THE LITERATURE TO HAVE                          A SENSITIVITY AND NEGATIVE                          PREDICTIVE VALUE OF AT LEAST                          98 TO 99%.  THE TEST RESULT                          SHOULD BE CORRELATED WITH                          AN ASSESSMENT OF THE CLINICAL                          PROBABILITY OF DVT / VTE.  Marland Kitchen Total Protein (g/dL) 21/30/8657 7.9  8.4-6.9 Final  .  Albumin (g/dL) 62/95/2841 3.0* 3.2-4.4 Final  . AST (U/L) 06/24/2011 16  0-37 Final  . ALT (U/L) 06/24/2011 17  0-35 Final  . Alkaline Phosphatase (U/L) 06/24/2011 98  39-117 Final  . Total Bilirubin (mg/dL) 04/07/7251 0.2* 6.6-4.4 Final  . Bilirubin, Direct (mg/dL) 03/47/4259 <5.6  3.8-7.5 Final   REPEATED TO VERIFY  . Indirect Bilirubin (mg/dL) 64/33/2951 NOT CALCULATED  0.3-0.9 Final  . TSH (uIU/mL) 06/24/2011 0.325* 0.350-4.500 Final  . O2 Content (L/min) 06/24/2011 4.0   Final  . Delivery systems  06/24/2011 NASAL CANNULA   Final  . pH, Arterial  06/24/2011 7.276* 7.350-7.400 Final  . pCO2 arterial (mmHg) 06/24/2011 96.7* 35.0-45.0 Final   Comment: CRITICAL RESULT CALLED TO, READ BACK BY AND VERIFIED WITH:                          Georga Hacking AT 1525 BY MJACKSON,RRT,RCP ON [id number]  . pO2, Arterial (mmHg) 06/24/2011 64.5* 80.0-100.0 Final  . Bicarbonate (mEq/L) 06/24/2011 43.6* 20.0-24.0 Final  . TCO2 (mmol/L) 06/24/2011 46.5  0-100 Final  . Acid-Base Excess (mmol/L) 06/24/2011 16.3* 0.0-2.0 Final  . O2 Saturation (%) 06/24/2011 90.3   Final  . Patient temperature  06/24/2011 98.6   Final  . Collection site  06/24/2011 RIGHT RADIAL   Final  . Drawn by  06/24/2011 06301   Final  . Sample type  06/24/2011 ARTERIAL DRAW   Final  . West Carbo test (pass/fail)  06/24/2011 PASS  PASS Final  . Vitamin B-12 (pg/mL) 06/24/2011 575  211-911 Final  . Folate (ng/mL) 06/24/2011 >20.0   Final   Comment: (NOTE)                          Reference Ranges                                 Deficient:       0.4 - 3.3 ng/mL                                 Indeterminate:   3.4 - 5.4 ng/mL  Normal:              > 5.4 ng/mL  . Iron (ug/dL) 40/98/1191 14* 47-829 Final  . TIBC (ug/dL) 56/21/3086 578  469-629 Final  . Saturation Ratios (%) 06/24/2011 3* 20-55 Final  . UIBC (ug/dL) 52/84/1324 401* 027-253 Final  . Ferritin (ng/mL) 06/24/2011 38  10-291 Final  . Retic Ct Pct  (%) 06/24/2011 4.0* 0.4-3.1 Final  . RBC. (MIL/uL) 06/24/2011 3.91  3.87-5.11 Final  . Retic Count, Manual (K/uL) 06/24/2011 156.4  19.0-186.0 Final  . Sodium (mEq/L) 06/25/2011 142  135-145 Final  . Potassium (mEq/L) 06/25/2011 4.4  3.5-5.1 Final  . Chloride (mEq/L) 06/25/2011 95* 96-112 Final  . CO2 (mEq/L) 06/25/2011 43* 19-32 Final   Comment: CRITICAL RESULT CALLED TO, READ BACK BY AND VERIFIED WITH:                          WHITAKER L RN 06/25/11 0746 COSTELLO B  . Glucose, Bld (mg/dL) 66/44/0347 425* 95-63 Final  . BUN (mg/dL) 87/56/4332 12  9-51 Final  . Creatinine, Ser (mg/dL) 88/41/6606 3.01  6.01-0.93 Final  . Calcium (mg/dL) 23/55/7322 8.5  0.2-54.2 Final  . GFR calc non Af Amer (mL/min) 06/25/2011 >90  >90 Final  . GFR calc Af Amer (mL/min) 06/25/2011 >90  >90 Final   Comment:                                 The eGFR has been calculated                          using the CKD EPI equation.                          This calculation has not been                          validated in all clinical                          situations.                          eGFR's persistently                          <90 mL/min signify                          possible Chronic Kidney Disease.  . Path Review  06/25/2011 STOMATOCYTES   Final   POLYCHROMASIA PRESENT  . WBC (K/uL) 06/26/2011 18.0* 4.0-10.5 Final  . RBC (MIL/uL) 06/26/2011 3.75* 3.87-5.11 Final  . Hemoglobin (g/dL) 70/62/3762 83.1* 51.7-61.6 Final  . HCT (%) 06/26/2011 38.2  36.0-46.0 Final  . MCV (fL) 06/26/2011 101.9* 78.0-100.0 Final  . MCH (pg) 06/26/2011 28.8  26.0-34.0 Final  . MCHC (g/dL) 07/37/1062 69.4* 85.4-62.7 Final  . RDW (%) 06/26/2011 14.9  11.5-15.5 Final  . Platelets (K/uL) 06/26/2011 351  150-400 Final  . Sodium (mEq/L) 06/26/2011 143  135-145 Final  . Potassium (mEq/L) 06/26/2011 4.4  3.5-5.1 Final  . Chloride (mEq/L) 06/26/2011 91* 96-112 Final  . CO2 (mEq/L) 06/26/2011 >45* 19-32 Final   Comment:  REPEATED  TO VERIFY                          CRITICAL RESULT CALLED TO, READ BACK BY AND VERIFIED WITH:                          ELLER L.,RN 06/26/11 0813 BY JONESJ  . Glucose, Bld (mg/dL) 36/64/4034 84  74-25 Final  . BUN (mg/dL) 95/63/8756 17  4-33 Final  . Creatinine, Ser (mg/dL) 29/51/8841 6.60  6.30-1.60 Final  . Calcium (mg/dL) 10/93/2355 9.0  7.3-22.0 Final  . GFR calc non Af Amer (mL/min) 06/26/2011 >90  >90 Final  . GFR calc Af Amer (mL/min) 06/26/2011 >90  >90 Final   Comment:                                 The eGFR has been calculated                          using the CKD EPI equation.                          This calculation has not been                          validated in all clinical                          situations.                          eGFR's persistently                          <90 mL/min signify                          possible Chronic Kidney Disease.  . Free T4 (ng/dL) 25/42/7062 3.76  2.83-1.51 Final  . Sodium (mEq/L) 06/27/2011 142  135-145 Final  . Potassium (mEq/L) 06/27/2011 3.7  3.5-5.1 Final  . Chloride (mEq/L) 06/27/2011 92* 96-112 Final  . CO2 (mEq/L) 06/27/2011 41* 19-32 Final   Comment: CRITICAL RESULT CALLED TO, READ BACK BY AND VERIFIED WITH:                          ELLERPRN [id number] MCCAULEG  . Glucose, Bld (mg/dL) 37/01/6268 485* 46-27 Final  . BUN (mg/dL) 03/50/0938 15  1-82 Final  . Creatinine, Ser (mg/dL) 99/37/1696 7.89  3.81-0.17 Final  . Calcium (mg/dL) 51/05/5850 8.7  7.7-82.4 Final  . GFR calc non Af Amer (mL/min) 06/27/2011 >90  >90 Final  . GFR calc Af Amer (mL/min) 06/27/2011 >90  >90 Final   Comment:                                 The eGFR has been calculated                          using the CKD EPI equation.  This calculation has not been                          validated in all clinical                          situations.                          eGFR's persistently                           <90 mL/min signify                          possible Chronic Kidney Disease.  . WBC (K/uL) 06/27/2011 16.2* 4.0-10.5 Final  . RBC (MIL/uL) 06/27/2011 3.78* 3.87-5.11 Final  . Hemoglobin (g/dL) 98/02/9146 82.9* 56.2-13.0 Final  . HCT (%) 06/27/2011 38.4  36.0-46.0 Final  . MCV (fL) 06/27/2011 101.6* 78.0-100.0 Final  . MCH (pg) 06/27/2011 28.6  26.0-34.0 Final  . MCHC (g/dL) 86/57/8469 62.9* 52.8-41.3 Final  . RDW (%) 06/27/2011 14.8  11.5-15.5 Final  . Platelets (K/uL) 06/27/2011 280  150-400 Final  . Sodium (mEq/L) 06/28/2011 139  135-145 Final  . Potassium (mEq/L) 06/28/2011 4.1  3.5-5.1 Final  . Chloride (mEq/L) 06/28/2011 92* 96-112 Final  . CO2 (mEq/L) 06/28/2011 41* 19-32 Final   Comment: REPEATED TO VERIFY                          CRITICAL RESULT CALLED TO, READ BACK BY AND VERIFIED WITH:                          PAT [facility] AT 0751 06/28/11 BY ZPERRY.  . Glucose, Bld (mg/dL) 24/40/1027 90  25-36 Final  . BUN (mg/dL) 64/40/3474 15  2-59 Final  . Creatinine, Ser (mg/dL) 56/38/7564 3.32  9.51-8.84 Final  . Calcium (mg/dL) 16/60/6301 8.6  6.0-10.9 Final  . GFR calc non Af Amer (mL/min) 06/28/2011 >90  >90 Final  . GFR calc Af Amer (mL/min) 06/28/2011 >90  >90 Final   Comment:                                 The eGFR has been calculated                          using the CKD EPI equation.                          This calculation has not been                          validated in all clinical                          situations.                          eGFR's persistently                          <90 mL/min  signify                          possible Chronic Kidney Disease.  . O2 Content (L/min) 06/28/2011 2.0   Final  . Delivery systems  06/28/2011 NASAL CANNULA   Final  . pH, Arterial  06/28/2011 7.285* 7.350-7.400 Final  . pCO2 arterial (mmHg) 06/28/2011 89.5* 35.0-45.0 Final   Comment: CRITICAL RESULT CALLED TO, READ BACK BY AND VERIFIED WITH:                           SHANITA LEE, RN AT 1552, BY CARLA HESTER RRT, RCP ON 06/28/11  . pO2, Arterial (mmHg) 06/28/2011 47.2* 80.0-100.0 Final  . Bicarbonate (mEq/L) 06/28/2011 41.2* 20.0-24.0 Final  . TCO2 (mmol/L) 06/28/2011 44.0  0-100 Final  . Acid-Base Excess (mmol/L) 06/28/2011 14.2* 0.0-2.0 Final  . O2 Saturation (%) 06/28/2011 76.6   Final  . Patient temperature  06/28/2011 98.6   Final  . Collection site  06/28/2011 RIGHT RADIAL   Final  . Drawn by  06/28/2011 [id number]   Final  . Sample type  06/28/2011 ARTERIAL DRAW   Final  . West Carbo test (pass/fail)  06/28/2011 PASS  PASS Final  . Glucose-Capillary (mg/dL) 91/47/8295 621* 30-86 Final  . O2 Content (L/min) 06/28/2011 3.0   Final  . Delivery systems  06/28/2011 NASAL CANNULA   Final  . pH, Arterial  06/28/2011 7.243* 7.350-7.400 Final  . pCO2 arterial (mmHg) 06/28/2011 102.0* 35.0-45.0 Final   Comment: CRITICAL RESULT CALLED TO, READ BACK BY AND VERIFIED WITH:                          Bryson Ha, RN AT 1832 , BY CARLA HESTER RRT, RCP ON 3                          CRITICAL RESULT CALLED TO, READ BACK BY AND VERIFIED WITH:                          PATRICIA Luiz Ochoa AT 1832, BY CARLA HESTER RRT, RCP ON 06/28/11  . pO2, Arterial (mmHg) 06/28/2011 57.7* 80.0-100.0 Final  . Bicarbonate (mEq/L) 06/28/2011 42.5* 20.0-24.0 Final  . TCO2 (mmol/L) 06/28/2011 45.7  0-100 Final  . Acid-Base Excess (mmol/L) 06/28/2011 14.9* 0.0-2.0 Final  . O2 Saturation (%) 06/28/2011 86.3   Final  . Patient temperature  06/28/2011 98.6   Final  . Collection site  06/28/2011 RIGHT RADIAL   Final  . Drawn by  06/28/2011 [id number]   Final  . Sample type  06/28/2011 ARTERIAL DRAW   Final  . West Carbo test (pass/fail)  06/28/2011 PASS  PASS Final  . MRSA by PCR  06/28/2011 NEGATIVE  NEGATIVE Final   Comment:                                 The GeneXpert MRSA Assay (FDA                          approved for NASAL specimens                          only), is one component of a  comprehensive MRSA colonization                          surveillance program. It is not                          intended to diagnose MRSA                          infection nor to guide or                          monitor treatment for                          MRSA infections.  ]    Signed: Yaakov Guthrie, BRAD 07/04/2011, 4:34 PM

## 2011-07-09 ENCOUNTER — Institutional Professional Consult (permissible substitution): Payer: Medicaid Other | Admitting: Pulmonary Disease

## 2011-07-15 ENCOUNTER — Telehealth: Payer: Self-pay | Admitting: *Deleted

## 2011-07-15 NOTE — Telephone Encounter (Signed)
[name] from [facility] called states Dr Josem Kaufmann wrote an order for tele monitoring CHF - pt refuses, but pt is willing to continue with home visit. Stanton Kidney Davis Vannatter RN 07/15/11 8:35AM

## 2011-07-16 NOTE — Telephone Encounter (Signed)
Thank you for the update on the patient's preferences.

## 2013-11-13 ENCOUNTER — Emergency Department (HOSPITAL_COMMUNITY): Payer: Medicaid Other

## 2013-11-13 ENCOUNTER — Encounter (HOSPITAL_COMMUNITY): Payer: Self-pay | Admitting: Radiology

## 2013-11-13 ENCOUNTER — Inpatient Hospital Stay (HOSPITAL_COMMUNITY): Payer: Medicaid Other

## 2013-11-13 ENCOUNTER — Inpatient Hospital Stay (HOSPITAL_COMMUNITY)
Admission: EM | Admit: 2013-11-13 | Discharge: 2013-12-03 | DRG: 207 | Disposition: A | Discharge status: Skilled Nursing Facility | Payer: Medicaid Other | Attending: Internal Medicine | Admitting: Internal Medicine

## 2013-11-13 DIAGNOSIS — E874 Mixed disorder of acid-base balance: Secondary | ICD-10-CM | POA: Diagnosis present

## 2013-11-13 DIAGNOSIS — R000 Tachycardia, unspecified: Secondary | ICD-10-CM | POA: Diagnosis present

## 2013-11-13 DIAGNOSIS — I5033 Acute on chronic diastolic (congestive) heart failure: Secondary | ICD-10-CM | POA: Diagnosis present

## 2013-11-13 DIAGNOSIS — S82853B Displaced trimalleolar fracture of unspecified lower leg, initial encounter for open fracture type I or II: Secondary | ICD-10-CM | POA: Diagnosis present

## 2013-11-13 DIAGNOSIS — I509 Heart failure, unspecified: Secondary | ICD-10-CM | POA: Diagnosis present

## 2013-11-13 DIAGNOSIS — E662 Morbid (severe) obesity with alveolar hypoventilation: Secondary | ICD-10-CM | POA: Diagnosis present

## 2013-11-13 DIAGNOSIS — S81009A Unspecified open wound, unspecified knee, initial encounter: Secondary | ICD-10-CM | POA: Diagnosis present

## 2013-11-13 DIAGNOSIS — J45901 Unspecified asthma with (acute) exacerbation: Principal | ICD-10-CM

## 2013-11-13 DIAGNOSIS — T794XXA Traumatic shock, initial encounter: Secondary | ICD-10-CM

## 2013-11-13 DIAGNOSIS — E1169 Type 2 diabetes mellitus with other specified complication: Secondary | ICD-10-CM | POA: Diagnosis present

## 2013-11-13 DIAGNOSIS — R0602 Shortness of breath: Secondary | ICD-10-CM | POA: Diagnosis present

## 2013-11-13 DIAGNOSIS — B974 Respiratory syncytial virus as the cause of diseases classified elsewhere: Secondary | ICD-10-CM | POA: Diagnosis present

## 2013-11-13 DIAGNOSIS — Z9981 Dependence on supplemental oxygen: Secondary | ICD-10-CM

## 2013-11-13 DIAGNOSIS — S91009A Unspecified open wound, unspecified ankle, initial encounter: Secondary | ICD-10-CM

## 2013-11-13 DIAGNOSIS — Z23 Encounter for immunization: Secondary | ICD-10-CM | POA: Diagnosis not present

## 2013-11-13 DIAGNOSIS — D539 Nutritional anemia, unspecified: Secondary | ICD-10-CM | POA: Diagnosis present

## 2013-11-13 DIAGNOSIS — J95821 Acute postprocedural respiratory failure: Secondary | ICD-10-CM

## 2013-11-13 DIAGNOSIS — E872 Acidosis, unspecified: Secondary | ICD-10-CM | POA: Diagnosis present

## 2013-11-13 DIAGNOSIS — Z87891 Personal history of nicotine dependence: Secondary | ICD-10-CM

## 2013-11-13 DIAGNOSIS — Z885 Allergy status to narcotic agent status: Secondary | ICD-10-CM

## 2013-11-13 DIAGNOSIS — Z7982 Long term (current) use of aspirin: Secondary | ICD-10-CM

## 2013-11-13 DIAGNOSIS — J9601 Acute respiratory failure with hypoxia: Secondary | ICD-10-CM

## 2013-11-13 DIAGNOSIS — J9620 Acute and chronic respiratory failure, unspecified whether with hypoxia or hypercapnia: Secondary | ICD-10-CM | POA: Diagnosis present

## 2013-11-13 DIAGNOSIS — Z6841 Body Mass Index (BMI) 40.0 and over, adult: Secondary | ICD-10-CM | POA: Diagnosis not present

## 2013-11-13 DIAGNOSIS — J441 Chronic obstructive pulmonary disease with (acute) exacerbation: Secondary | ICD-10-CM | POA: Diagnosis not present

## 2013-11-13 DIAGNOSIS — S82899A Other fracture of unspecified lower leg, initial encounter for closed fracture: Secondary | ICD-10-CM | POA: Diagnosis present

## 2013-11-13 DIAGNOSIS — G9340 Encephalopathy, unspecified: Secondary | ICD-10-CM | POA: Diagnosis present

## 2013-11-13 DIAGNOSIS — E870 Hyperosmolality and hypernatremia: Secondary | ICD-10-CM | POA: Diagnosis not present

## 2013-11-13 DIAGNOSIS — R5381 Other malaise: Secondary | ICD-10-CM | POA: Diagnosis present

## 2013-11-13 DIAGNOSIS — I4891 Unspecified atrial fibrillation: Secondary | ICD-10-CM | POA: Diagnosis present

## 2013-11-13 DIAGNOSIS — G4733 Obstructive sleep apnea (adult) (pediatric): Secondary | ICD-10-CM

## 2013-11-13 DIAGNOSIS — B338 Other specified viral diseases: Secondary | ICD-10-CM | POA: Diagnosis present

## 2013-11-13 DIAGNOSIS — J9612 Chronic respiratory failure with hypercapnia: Secondary | ICD-10-CM

## 2013-11-13 DIAGNOSIS — S81809A Unspecified open wound, unspecified lower leg, initial encounter: Secondary | ICD-10-CM

## 2013-11-13 DIAGNOSIS — S8253XB Displaced fracture of medial malleolus of unspecified tibia, initial encounter for open fracture type I or II: Secondary | ICD-10-CM

## 2013-11-13 DIAGNOSIS — T380X5A Adverse effect of glucocorticoids and synthetic analogues, initial encounter: Secondary | ICD-10-CM | POA: Diagnosis present

## 2013-11-13 DIAGNOSIS — E785 Hyperlipidemia, unspecified: Secondary | ICD-10-CM | POA: Diagnosis present

## 2013-11-13 DIAGNOSIS — S82201Q Unspecified fracture of shaft of right tibia, subsequent encounter for open fracture type I or II with malunion: Secondary | ICD-10-CM

## 2013-11-13 DIAGNOSIS — S82401Q Unspecified fracture of shaft of right fibula, subsequent encounter for open fracture type I or II with malunion: Secondary | ICD-10-CM

## 2013-11-13 DIAGNOSIS — W1811XA Fall from or off toilet without subsequent striking against object, initial encounter: Secondary | ICD-10-CM | POA: Diagnosis present

## 2013-11-13 DIAGNOSIS — S82202C Unspecified fracture of shaft of left tibia, initial encounter for open fracture type IIIA, IIIB, or IIIC: Secondary | ICD-10-CM

## 2013-11-13 DIAGNOSIS — L851 Acquired keratosis [keratoderma] palmaris et plantaris: Secondary | ICD-10-CM

## 2013-11-13 DIAGNOSIS — Z993 Dependence on wheelchair: Secondary | ICD-10-CM

## 2013-11-13 DIAGNOSIS — S82209A Unspecified fracture of shaft of unspecified tibia, initial encounter for closed fracture: Secondary | ICD-10-CM

## 2013-11-13 DIAGNOSIS — J9602 Acute respiratory failure with hypercapnia: Secondary | ICD-10-CM

## 2013-11-13 DIAGNOSIS — S82409A Unspecified fracture of shaft of unspecified fibula, initial encounter for closed fracture: Secondary | ICD-10-CM

## 2013-11-13 DIAGNOSIS — IMO0002 Reserved for concepts with insufficient information to code with codable children: Secondary | ICD-10-CM

## 2013-11-13 DIAGNOSIS — J9600 Acute respiratory failure, unspecified whether with hypoxia or hypercapnia: Secondary | ICD-10-CM

## 2013-11-13 DIAGNOSIS — S82402C Unspecified fracture of shaft of left fibula, initial encounter for open fracture type IIIA, IIIB, or IIIC: Secondary | ICD-10-CM

## 2013-11-13 DIAGNOSIS — I5032 Chronic diastolic (congestive) heart failure: Secondary | ICD-10-CM

## 2013-11-13 LAB — TYPE AND SCREEN
ABO/RH(D): O POS
Antibody Screen: NEGATIVE
UNIT DIVISION: 0
Unit division: 0

## 2013-11-13 LAB — CBG MONITORING, ED: GLUCOSE-CAPILLARY: 267 mg/dL — AB (ref 70–99)

## 2013-11-13 LAB — ABO/RH: ABO/RH(D): O POS

## 2013-11-13 LAB — I-STAT ARTERIAL BLOOD GAS, ED
Acid-Base Excess: 7 mmol/L — ABNORMAL HIGH (ref 0.0–2.0)
Bicarbonate: 36.4 mEq/L — ABNORMAL HIGH (ref 20.0–24.0)
O2 Saturation: 100 %
PCO2 ART: 75.6 mmHg — AB (ref 35.0–45.0)
Patient temperature: 98.6
TCO2: 39 mmol/L (ref 0–100)
pH, Arterial: 7.291 — ABNORMAL LOW (ref 7.350–7.450)
pO2, Arterial: 377 mmHg — ABNORMAL HIGH (ref 80.0–100.0)

## 2013-11-13 LAB — URINALYSIS, ROUTINE W REFLEX MICROSCOPIC
Bilirubin Urine: NEGATIVE
Glucose, UA: 250 mg/dL — AB
HGB URINE DIPSTICK: NEGATIVE
Ketones, ur: NEGATIVE mg/dL
LEUKOCYTES UA: NEGATIVE
Nitrite: NEGATIVE
PROTEIN: 30 mg/dL — AB
Specific Gravity, Urine: 1.02 (ref 1.005–1.030)
Urobilinogen, UA: 0.2 mg/dL (ref 0.0–1.0)
pH: 5.5 (ref 5.0–8.0)

## 2013-11-13 LAB — BLOOD GAS, ARTERIAL
Acid-Base Excess: 2.4 mmol/L — ABNORMAL HIGH (ref 0.0–2.0)
BICARBONATE: 28.4 meq/L — AB (ref 20.0–24.0)
Drawn by: 41977
FIO2: 0.4 %
MECHVT: 510 mL
O2 Saturation: 96.4 %
PCO2 ART: 61.7 mmHg — AB (ref 35.0–45.0)
PEEP/CPAP: 5 cmH2O
PO2 ART: 86.2 mmHg (ref 80.0–100.0)
Patient temperature: 98.6
RATE: 24 resp/min
TCO2: 30.3 mmol/L (ref 0–100)
pH, Arterial: 7.285 — ABNORMAL LOW (ref 7.350–7.450)

## 2013-11-13 LAB — COMPREHENSIVE METABOLIC PANEL
ALBUMIN: 3.1 g/dL — AB (ref 3.5–5.2)
ALT: 35 U/L (ref 0–35)
ANION GAP: 10 (ref 5–15)
AST: 41 U/L — ABNORMAL HIGH (ref 0–37)
Alkaline Phosphatase: 115 U/L (ref 39–117)
BUN: 10 mg/dL (ref 6–23)
CO2: 37 mEq/L — ABNORMAL HIGH (ref 19–32)
CREATININE: 0.63 mg/dL (ref 0.50–1.10)
Calcium: 9.3 mg/dL (ref 8.4–10.5)
Chloride: 93 mEq/L — ABNORMAL LOW (ref 96–112)
GFR calc Af Amer: >90 mL/min (ref >90)
GFR calc non Af Amer: >90 mL/min (ref >90)
Glucose, Bld: 300 mg/dL — ABNORMAL HIGH (ref 70–99)
Potassium: 5.5 mEq/L — ABNORMAL HIGH (ref 3.7–5.3)
Sodium: 140 mEq/L (ref 137–147)
TOTAL PROTEIN: 8.4 g/dL — AB (ref 6.0–8.3)

## 2013-11-13 LAB — I-STAT TROPONIN, ED: Troponin i, poc: 0.01 ng/mL (ref 0.00–0.08)

## 2013-11-13 LAB — ETHANOL: Alcohol, Ethyl (B): <11 mg/dL (ref 0–11)

## 2013-11-13 LAB — URINE MICROSCOPIC-ADD ON

## 2013-11-13 LAB — GLUCOSE, CAPILLARY: Glucose-Capillary: 154 mg/dL — ABNORMAL HIGH (ref 70–99)

## 2013-11-13 LAB — CBC
HEMATOCRIT: 44.8 % (ref 36.0–46.0)
HEMOGLOBIN: 12.6 g/dL (ref 12.0–15.0)
MCH: 30.9 pg (ref 26.0–34.0)
MCHC: 28.1 g/dL — ABNORMAL LOW (ref 30.0–36.0)
MCV: 109.8 fL — ABNORMAL HIGH (ref 78.0–100.0)
Platelets: 278 10*3/uL (ref 150–400)
RBC: 4.08 MIL/uL (ref 3.87–5.11)
RDW: 13.3 % (ref 11.5–15.5)
WBC: 13.9 10*3/uL — AB (ref 4.0–10.5)

## 2013-11-13 LAB — PRO B NATRIURETIC PEPTIDE: Pro B Natriuretic peptide (BNP): 215 pg/mL — ABNORMAL HIGH (ref 0–125)

## 2013-11-13 LAB — TROPONIN I

## 2013-11-13 LAB — PROTIME-INR
INR: 0.98 (ref 0.00–1.49)
Prothrombin Time: 13 seconds (ref 11.6–15.2)

## 2013-11-13 LAB — CDS SEROLOGY

## 2013-11-13 MED ORDER — INSULIN ASPART 100 UNIT/ML ~~LOC~~ SOLN
2.0000 [IU] | SUBCUTANEOUS | Status: DC
  Administered 2013-11-13: 4 [IU] via SUBCUTANEOUS
  Administered 2013-11-14: 2 [IU] via SUBCUTANEOUS
  Administered 2013-11-14 (×2): 4 [IU] via SUBCUTANEOUS
  Administered 2013-11-14: 6 [IU] via SUBCUTANEOUS
  Administered 2013-11-14 (×2): 2 [IU] via SUBCUTANEOUS
  Administered 2013-11-15: 4 [IU] via SUBCUTANEOUS
  Administered 2013-11-15 (×2): 6 [IU] via SUBCUTANEOUS
  Administered 2013-11-15: 4 [IU] via SUBCUTANEOUS
  Administered 2013-11-15: 6 [IU] via SUBCUTANEOUS

## 2013-11-13 MED ORDER — CHLORHEXIDINE GLUCONATE 0.12 % MT SOLN
15.0000 mL | Freq: Two times a day (BID) | OROMUCOSAL | Status: DC
  Administered 2013-11-13 – 2013-12-03 (×35): 15 mL via OROMUCOSAL
  Filled 2013-11-13 (×41): qty 15

## 2013-11-13 MED ORDER — FAMOTIDINE IN NACL 20-0.9 MG/50ML-% IV SOLN
20.0000 mg | Freq: Two times a day (BID) | INTRAVENOUS | Status: DC
Start: 2013-11-13 — End: 2013-11-14
  Administered 2013-11-13: 20 mg via INTRAVENOUS
  Filled 2013-11-13 (×3): qty 50

## 2013-11-13 MED ORDER — SODIUM CHLORIDE 0.9 % IV BOLUS (SEPSIS)
500.0000 mL | Freq: Once | INTRAVENOUS | Status: AC
  Administered 2013-11-13: 500 mL via INTRAVENOUS

## 2013-11-13 MED ORDER — CETYLPYRIDINIUM CHLORIDE 0.05 % MT LIQD
7.0000 mL | Freq: Four times a day (QID) | OROMUCOSAL | Status: DC
  Administered 2013-11-13: 7 mL via OROMUCOSAL

## 2013-11-13 MED ORDER — SENNOSIDES 8.8 MG/5ML PO SYRP
5.0000 mL | ORAL_SOLUTION | Freq: Two times a day (BID) | ORAL | Status: DC | PRN
  Filled 2013-11-13: qty 5

## 2013-11-13 MED ORDER — FENTANYL BOLUS VIA INFUSION
50.0000 ug | INTRAVENOUS | Status: DC | PRN
  Administered 2013-11-14: 100 ug via INTRAVENOUS
  Administered 2013-11-15: 50 ug via INTRAVENOUS
  Administered 2013-11-16 – 2013-11-17 (×8): 100 ug via INTRAVENOUS
  Filled 2013-11-13: qty 100

## 2013-11-13 MED ORDER — SODIUM CHLORIDE 0.9 % IV SOLN
0.0000 ug/h | INTRAVENOUS | Status: DC
  Administered 2013-11-13: 50 ug/h via INTRAVENOUS
  Administered 2013-11-16: 100 ug/h via INTRAVENOUS
  Filled 2013-11-13 (×3): qty 50

## 2013-11-13 MED ORDER — IOHEXOL 300 MG/ML  SOLN
100.0000 mL | Freq: Once | INTRAMUSCULAR | Status: AC | PRN
  Administered 2013-11-13: 100 mL via INTRAVENOUS

## 2013-11-13 MED ORDER — MIDAZOLAM HCL 2 MG/2ML IJ SOLN
INTRAMUSCULAR | Status: AC
  Filled 2013-11-13: qty 4

## 2013-11-13 MED ORDER — ASPIRIN 300 MG RE SUPP: 300.0000 mg | RECTAL | Status: AC

## 2013-11-13 MED ORDER — SODIUM CHLORIDE 0.9 % IV SOLN
INTRAVENOUS | Status: DC
  Administered 2013-11-13: 21:00:00 via INTRAVENOUS

## 2013-11-13 MED ORDER — PROPOFOL 10 MG/ML IV EMUL
INTRAVENOUS | Status: AC
Start: 2013-11-13 — End: 2013-11-14
  Filled 2013-11-13: qty 100

## 2013-11-13 MED ORDER — ASPIRIN 81 MG PO CHEW
324.0000 mg | CHEWABLE_TABLET | ORAL | Status: AC
  Administered 2013-11-13: 324 mg via ORAL
  Filled 2013-11-13: qty 4

## 2013-11-13 MED ORDER — CEFAZOLIN SODIUM-DEXTROSE 2-3 GM-% IV SOLR
2.0000 g | Freq: Once | INTRAVENOUS | Status: AC
  Administered 2013-11-13: 2 g via INTRAVENOUS
  Filled 2013-11-13: qty 50

## 2013-11-13 MED ORDER — CEFAZOLIN SODIUM 1-5 GM-% IV SOLN
1.0000 g | Freq: Three times a day (TID) | INTRAVENOUS | Status: DC
  Filled 2013-11-13: qty 50

## 2013-11-13 MED ORDER — FENTANYL CITRATE 0.05 MG/ML IJ SOLN
50.0000 ug | Freq: Once | INTRAMUSCULAR | Status: AC
  Administered 2013-11-13: 50 ug via INTRAVENOUS

## 2013-11-13 MED ORDER — CEFAZOLIN SODIUM 1-5 GM-% IV SOLN: 1.0000 g | Freq: Three times a day (TID) | INTRAVENOUS | Status: DC

## 2013-11-13 MED ORDER — ATROPINE SULFATE 1 MG/ML IJ SOLN
INTRAMUSCULAR | Status: AC | PRN
  Administered 2013-11-13: 1 mg via INTRAVENOUS

## 2013-11-13 MED ORDER — ENOXAPARIN SODIUM 40 MG/0.4ML ~~LOC~~ SOLN
40.0000 mg | SUBCUTANEOUS | Status: DC
  Administered 2013-11-13: 40 mg via SUBCUTANEOUS
  Filled 2013-11-13 (×2): qty 0.4

## 2013-11-13 MED ORDER — MIDAZOLAM HCL 5 MG/5ML IJ SOLN
INTRAMUSCULAR | Status: AC | PRN
  Administered 2013-11-13: 2 mg via INTRAVENOUS

## 2013-11-13 MED ORDER — IPRATROPIUM-ALBUTEROL 0.5-2.5 (3) MG/3ML IN SOLN
3.0000 mL | Freq: Four times a day (QID) | RESPIRATORY_TRACT | Status: DC
  Administered 2013-11-13 – 2013-11-15 (×6): 3 mL via RESPIRATORY_TRACT
  Filled 2013-11-13 (×5): qty 3

## 2013-11-13 MED ORDER — MIDAZOLAM HCL 2 MG/2ML IJ SOLN
1.0000 mg | INTRAMUSCULAR | Status: DC | PRN
  Filled 2013-11-13: qty 2

## 2013-11-13 MED ORDER — ALBUTEROL SULFATE (2.5 MG/3ML) 0.083% IN NEBU
2.5000 mg | INHALATION_SOLUTION | RESPIRATORY_TRACT | Status: DC | PRN
  Administered 2013-11-23 – 2013-12-03 (×12): 2.5 mg via RESPIRATORY_TRACT
  Filled 2013-11-13 (×13): qty 3

## 2013-11-13 MED ORDER — DEXTROSE 5 % IV SOLN
2.0000 g | INTRAVENOUS | Status: DC
  Administered 2013-11-13: 2 g via INTRAVENOUS
  Filled 2013-11-13 (×2): qty 2

## 2013-11-13 MED ORDER — PROPOFOL 10 MG/ML IV EMUL
INTRAVENOUS | Status: AC
  Administered 2013-11-13: 25 ug/kg/min
  Filled 2013-11-13: qty 100

## 2013-11-13 MED ORDER — FENTANYL CITRATE 0.05 MG/ML IJ SOLN
25.0000 ug | Freq: Once | INTRAMUSCULAR | Status: AC
  Administered 2013-11-13: 25 ug via INTRAVENOUS

## 2013-11-13 MED ORDER — MIDAZOLAM HCL 2 MG/2ML IJ SOLN
INTRAMUSCULAR | Status: AC
  Administered 2013-11-13: 2 mg
  Filled 2013-11-13: qty 2

## 2013-11-13 MED ORDER — FENTANYL CITRATE 0.05 MG/ML IJ SOLN
INTRAMUSCULAR | Status: AC
  Administered 2013-11-13: 25 ug via INTRAVENOUS
  Filled 2013-11-13: qty 2

## 2013-11-13 MED ORDER — METHYLPREDNISOLONE SODIUM SUCC 125 MG IJ SOLR
60.0000 mg | Freq: Two times a day (BID) | INTRAMUSCULAR | Status: AC
  Administered 2013-11-13 – 2013-11-17 (×9): 60 mg via INTRAVENOUS
  Filled 2013-11-13 (×9): qty 0.96

## 2013-11-13 MED ORDER — SODIUM CHLORIDE 0.9 % IV SOLN: 250.0000 mL | INTRAVENOUS | Status: DC | PRN

## 2013-11-13 MED ORDER — GENTAMICIN SULFATE 40 MG/ML IJ SOLN
560.0000 mg | INTRAVENOUS | Status: DC
  Administered 2013-11-13: 560 mg via INTRAVENOUS
  Filled 2013-11-13: qty 14

## 2013-11-13 MED ORDER — TETANUS-DIPHTH-ACELL PERTUSSIS 5-2.5-18.5 LF-MCG/0.5 IM SUSP
0.5000 mL | Freq: Once | INTRAMUSCULAR | Status: AC
  Administered 2013-11-13: 0.5 mL via INTRAMUSCULAR
  Filled 2013-11-13: qty 0.5

## 2013-11-13 NOTE — ED Notes (Addendum)
report called to primary receiving RN, to 62M 03, up  On monitor with RN, RT and EMT. No changes, VSS.

## 2013-11-13 NOTE — ED Notes (Addendum)
Attempt[facility] report[facility]

## 2013-11-13 NOTE — ED Notes (Signed)
Heat turned up in room, warm blankets applied.

## 2013-11-13 NOTE — ED Notes (Signed)
Bedside report continued, no changes, VSS, sBP 84, care transferred. Family in w/r. 5th liter infused.

## 2013-11-13 NOTE — Progress Notes (Signed)
Orthopedic Tech Progress Note Patient Details:  Noralee StainCora E Cueva 02-20-1951 [id number]  Ortho Devices Type of Ortho Device: Ace wrap;Post (short leg) splint;Stirrup splint Ortho Device/Splint Location: RLE Ortho Device/Splint Interventions: Ordered;Application   Jennye MoccasinHughes, Dietrich Samuelson Craig 11/13/2013, 7:34 PM

## 2013-11-13 NOTE — Consult Note (Signed)
ORTHOPAEDIC CONSULTATION  REQUESTING PHYSICIAN: Candyce Churn III, *  Chief Complaint: bilateral ankle fractures  HPI: [name] is a 63 y.o. female found unresponsive by EMS and intubated at the scene.  Per mentally challenged son, patient was getting off of commode and fell and became unresponsive.  Patient is minimally ambulatory and mainly stays in wheelchair.  Has MMPs at baseline.  Ortho consulted for bilateral ankle fx dislocations, open on left.  Past Medical History  Diagnosis Date  . Asthma     with exaerbation and admission in 5/12. no h/ intubation. Is also suspected to have some degree of restrictive pattern and moderate emphysema  (per Dr. Teddy Spike note0 and h/o smoking in past.   . Obesity hypoventilation syndrome     followed with Dr. Stanton Kidney ([facility]) once in 2012.   Marland Kitchen OSA (obstructive sleep apnea)     on CPAP qhs. Sleep study in 2009 confirmed. Not very compliant with CPAP  . Diastolic CHF     Echo 2009 confirmed (poor acoustic window) , EF 55%,, mild RVH  . Physical deconditioning     wheelchair bound, unable to perforrm ADLs at home  . HLD (hyperlipidemia)    Past Surgical History  Procedure Laterality Date  . Cesarean section     History   Social History  . Marital Status: Single    Spouse Name: N/A    Number of Children: N/A  . Years of Education: N/A   Social History Main Topics  . Smoking status: Former Smoker -- 10 years  . Smokeless tobacco: Never Used  . Alcohol Use: No     Comment: " drank beer years ago "  . Drug Use: No  . Sexual Activity: Not Currently    Birth Control/ Protection: Post-menopausal   Other Topics Concern  . None   Social History Narrative   Lives with her son who has Bipolar in [place]. She takes care of him. She has one sister who helps her with getting to Doctor's appointment and other commuting needs. She is unable to bathe, clean herself, or perform ADLs requiring exertion. She is wheelchair bound  and gets short of breath with minimal exertion. 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 She has medicaid. She use to smoke 2 ppd but quit 10 yrs ago. She use to drink 16 oz beer daily but quit 10 yrs ago. She never did illicit drugs. She has graduated from college.    Family History  Problem Relation Age of Onset  . COPD Father    Allergies  Allergen Reactions  . Codeine Nausea And Vomiting   Prior to Admission medications   Medication Sig Start Date End Date Taking? Authorizing Provider  aspirin EC 81 MG tablet Take 81 mg by mouth daily.    Historical Provider, MD  fluticasone-salmeterol (ADVAIR HFA) 115-21 MCG/ACT inhaler Inhale 2 puffs into the lungs 2 (two) times daily. 06/28/11   Danley Danker, MD  pravastatin (PRAVACHOL) 10 MG tablet Take 10 mg by mouth daily.    Historical Provider, MD  predniSONE (DELTASONE) 10 MG tablet Take 4 pills by mouth daily for 3 days, then 3 pills daily for 3 days, then 2 pills daily for 3 days, then 1 pill daily for 3 days, thenstop 06/28/11   Danley Danker, MD   Ct Head Wo Contrast  11/13/2013   CLINICAL DATA:  Altered mental status post trauma  EXAM: CT HEAD WITHOUT  CONTRAST  CT CERVICAL SPINE WITHOUT CONTRAST  TECHNIQUE: Multidetector CT imaging of the head and cervical spine was performed following the standard protocol without intravenous contrast. Multiplanar CT image reconstructions of the cervical spine were also generated.  COMPARISON:  Head CT February 05, 2007  FINDINGS: CT HEAD FINDINGS  The ventricles are normal in size and configuration. There is no appreciable mass, hemorrhage, extra-axial fluid collection, or midline shift. The gray-white compartments are normal. Bony calvarium appears intact. Mastoid air cells are clear. There is diffuse opacification of both nares. There is bilateral maxillary and ethmoid sinus disease. Note that patient is intubated.  CT CERVICAL SPINE FINDINGS  There is no appreciable  fracture or spondylolisthesis. Prevertebral soft tissues and predental space regions are normal. There is moderately severe disc space narrowing at C5-6 and C6-7. There is moderate narrowing at C3-4, C4-5, and C7-T1. There is facet osteoarthritic change to varying degrees at all levels bilaterally. There is no frank disc extrusion or stenosis. Note that there is a nasogastric tube and endotracheal tube present.  IMPRESSION: CT head: No intracranial mass, hemorrhage, or extra-axial fluid. No acute infarct. Extensive opacification of the nares bilaterally as well as multifocal paranasal sinus disease.  CT cervical spine: Multifocal osteoarthritic change. No fracture or spondylolisthesis.   Electronically Signed   By: Bretta Bang M.D.   On: 11/13/2013 17:53   Ct Chest W Contrast  11/13/2013   CLINICAL DATA:  Fall out of wheelchair. Chest and abdominal injury. Acute respiratory failure. Intubated.  EXAM: CT CHEST, ABDOMEN, AND PELVIS WITH CONTRAST  TECHNIQUE: Multidetector CT imaging of the chest, abdomen and pelvis was performed following the standard protocol during bolus administration of intravenous contrast.  CONTRAST:  OMNIPAQUE IOHEXOL 300 MG/ML  SOLN  COMPARISON:  Chest CT on 06/24/2011  FINDINGS: CT CHEST FINDINGS  Mediastinum/Hilar Regions: No masses or pathologically enlarged lymph nodes identified.  Lungs: Mild emphysema noted. Bibasilar scarring again demonstrated. New mild heterogeneous airspace disease is seen in the medial left upper lobe, with which is nonspecific and could be due to contusion, aspiration, or pneumonia.  Pleura: No evidence of effusion or mass. No evidence of pneumothorax.  Vascular/Cardiac: No evidence of thoracic aortic injury or mediastinal hematoma.  Musculoskeletal: No suspicious bone lesions identified. No acute fractures identified.  Other: Endotracheal tube and nasogastric tube are seen in appropriate position.  CT ABDOMEN AND PELVIS FINDINGS  Liver: No  parenchymal lacerations or masses identified. Diffuse hepatic steatosis again demonstrated.  Gallbladder/Biliary: Mural pattern of calcification is suspicious for porcelain gallbladder. Small gallstones cannot be excluded.  Pancreas: No mass, inflammatory changes, or other parenchymal abnormality identified.  Spleen:  Within normal limits in size and appearance.  Adrenal Glands:  No mass identified.  Kidneys/Urinary Tract: No masses identified. No evidence of hydronephrosis. Small left renal cysts again noted. Foley catheter seen within the bladder.  Lymph Nodes:  No pathologically enlarged lymph nodes identified.  Bowel: Mild sigmoid diverticulosis again demonstrated. No evidence of diverticulitis or other significant abnormality.  Pelvic/Reproductive Organs: No mass or other significant abnormality identified.  Vascular:  No evidence of abdominal aortic aneurysm.  Musculoskeletal:  No suspicious bone lesions identified.  Other:  None.  IMPRESSION: Mild nonspecific pulmonary infiltrate in medial left upper lobe. Differential diagnosis includes pulmonary contusion, aspiration, or pneumonia. No other traumatic injury identified.  Emphysema.  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 Small calcified gallstones cannot be excluded.  Diverticulosis. No radiographic evidence of diverticulitis.   Electronically Signed  By: Myles Rosenthal M.D.   On: 11/13/2013 18:04   Ct Cervical Spine Wo Contrast  11/13/2013   CLINICAL DATA:  Altered mental status post trauma  EXAM: CT HEAD WITHOUT CONTRAST  CT CERVICAL SPINE WITHOUT CONTRAST  TECHNIQUE: Multidetector CT imaging of the head and cervical spine was performed following the standard protocol without intravenous contrast. Multiplanar CT image reconstructions of the cervical spine were also generated.  COMPARISON:  Head CT February 05, 2007  FINDINGS: CT HEAD FINDINGS  The ventricles are normal in size and configuration. There is no appreciable mass,  hemorrhage, extra-axial fluid collection, or midline shift. The gray-white compartments are normal. Bony calvarium appears intact. Mastoid air cells are clear. There is diffuse opacification of both nares. There is bilateral maxillary and ethmoid sinus disease. Note that patient is intubated.  CT CERVICAL SPINE FINDINGS  There is no appreciable fracture or spondylolisthesis. Prevertebral soft tissues and predental space regions are normal. There is moderately severe disc space narrowing at C5-6 and C6-7. There is moderate narrowing at C3-4, C4-5, and C7-T1. There is facet osteoarthritic change to varying degrees at all levels bilaterally. There is no frank disc extrusion or stenosis. Note that there is a nasogastric tube and endotracheal tube present.  IMPRESSION: CT head: No intracranial mass, hemorrhage, or extra-axial fluid. No acute infarct. Extensive opacification of the nares bilaterally as well as multifocal paranasal sinus disease.  CT cervical spine: Multifocal osteoarthritic change. No fracture or spondylolisthesis.   Electronically Signed   By: Bretta Bang M.D.   On: 11/13/2013 17:53   Ct Abdomen Pelvis W Contrast  11/13/2013   CLINICAL DATA:  Fall out of wheelchair. Chest and abdominal injury. Acute respiratory failure. Intubated.  EXAM: CT CHEST, ABDOMEN, AND PELVIS WITH CONTRAST  TECHNIQUE: Multidetector CT imaging of the chest, abdomen and pelvis was performed following the standard protocol during bolus administration of intravenous contrast.  CONTRAST:  OMNIPAQUE IOHEXOL 300 MG/ML  SOLN  COMPARISON:  Chest CT on 06/24/2011  FINDINGS: CT CHEST FINDINGS  Mediastinum/Hilar Regions: No masses or pathologically enlarged lymph nodes identified.  Lungs: Mild emphysema noted. Bibasilar scarring again demonstrated. New mild heterogeneous airspace disease is seen in the medial left upper lobe, with which is nonspecific and could be due to contusion, aspiration, or pneumonia.  Pleura: No  evidence of effusion or mass. No evidence of pneumothorax.  Vascular/Cardiac: No evidence of thoracic aortic injury or mediastinal hematoma.  Musculoskeletal: No suspicious bone lesions identified. No acute fractures identified.  Other: Endotracheal tube and nasogastric tube are seen in appropriate position.  CT ABDOMEN AND PELVIS FINDINGS  Liver: No parenchymal lacerations or masses identified. Diffuse hepatic steatosis again demonstrated.  Gallbladder/Biliary: Mural pattern of calcification is suspicious for porcelain gallbladder. Small gallstones cannot be excluded.  Pancreas: No mass, inflammatory changes, or other parenchymal abnormality identified.  Spleen:  Within normal limits in size and appearance.  Adrenal Glands:  No mass identified.  Kidneys/Urinary Tract: No masses identified. No evidence of hydronephrosis. Small left renal cysts again noted. Foley catheter seen within the bladder.  Lymph Nodes:  No pathologically enlarged lymph nodes identified.  Bowel: Mild sigmoid diverticulosis again demonstrated. No evidence of diverticulitis or other significant abnormality.  Pelvic/Reproductive Organs: No mass or other significant abnormality identified.  Vascular:  No evidence of abdominal aortic aneurysm.  Musculoskeletal:  No suspicious bone lesions identified.  Other:  None.  IMPRESSION: Mild nonspecific pulmonary infiltrate in medial left upper lobe. Differential diagnosis includes pulmonary contusion, aspiration, or  pneumonia. No other traumatic injury identified.  Emphysema.  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 Small calcified gallstones cannot be excluded.  Diverticulosis. No radiographic evidence of diverticulitis.   Electronically Signed   By: Myles Rosenthal M.D.   On: 11/13/2013 18:04   Dg Chest Port 1 View  11/13/2013   CLINICAL DATA:  Hypoxia  EXAM: PORTABLE CHEST - 1 VIEW  COMPARISON:  Chest radiograph and chest CT June 24, 2011  FINDINGS: A portion of the left base is  not imaged. Endotracheal tube tip is 4.5 cm above the carina. Nasogastric tube tip and side port are below the diaphragm. There is no appreciable pneumothorax in the regions which are visualized.  There is elevation of the right hemidiaphragm. No edema or consolidation. Heart size and pulmonary vascularity are normal. No adenopathy. No bone lesions are appreciable in visualized regions.  IMPRESSION: Tube positions as described. No pneumothorax appreciable. Note that a portion of the left lateral base region is not visualized.  No edema or consolidation identified in regions which are visualized.   Electronically Signed   By: Bretta Bang M.D.   On: 11/13/2013 17:26   Dg Ankle Left Port  11/13/2013   CLINICAL DATA:  Fall.  Ankle injury and pain.  EXAM: PORTABLE LEFT ANKLE - 1 VIEW  COMPARISON:  None.  FINDINGS: Only a single oblique view was obtained portably before the patient was taken to the catheterization lab. The shows fractures of the distal fibula and medial malleolus of the distal tibia. There is anterior and lateral subluxation of the talus with respect to the ankle mortise.  IMPRESSION: Limited one view exam shows fractures of the distal fibula and medial malleolus, with anterior and lateral subluxation of the talus.   Electronically Signed   By: Myles Rosenthal M.D.   On: 11/13/2013 18:17   Dg Ankle Right Port  11/13/2013   CLINICAL DATA:  Found unresponsive.  Obvious deformity.  EXAM: PORTABLE RIGHT ANKLE - 2 VIEW  COMPARISON:  04/23/2007  FINDINGS: Portable AP and lateral views. Comminuted distal fibular fracture. Comminuted medial and posterior tibial fractures. Widening of the ankle mortise with relative posterior and lateral subluxation of the talar dome relative to the distal tibia. Base of fifth metatarsal intact. Overlying soft tissue swelling.  IMPRESSION: Comminuted fracture subluxation involving the distal tibia and fibula. Suboptimally evaluated on these portable radiographs. Consider  further evaluation with CT.   Electronically Signed   By: Jeronimo Greaves M.D.   On: 11/13/2013 18:32    Positive ROS: All other systems have been reviewed and were otherwise negative with the exception of those mentioned in the HPI and as above.  Physical Exam: General: Morbidly obese, intubated and sedated Cardiovascular: stable Respiratory: intubated  GI: protuberant Skin: see MSK exam Neurologic: unable to test Psychiatric: unable to test  MUSCULOSKELETAL:  - 1 cm transverse open traumatic wound on medial aspect of ankle w/o gross contamination - bilateral ankles are grossly unstable - thick, lichenified skin over feet, ankles, shins - foot wwp - due to thick skin, unable to palpate and weakly dopplerable  Assessment: Bilateral ankle fracture dislocation Open on the left  Plan: - left ankle washed out at bedside and reduced and splinted - right ankle reduced and splinted - XRs BLE ordered  - trauma scans neg - CCM to eval and admit - patient unstable for formal I&D tonight - ancef, gent, tetanus started in ER and should continue ancef, ok to d/c gent - will  plan for formal I&D once medically stable - patient is critically ill presently  Thank you for the consult and the opportunity to see Ms. Legrand PittsMiller  N. Glee ArvinMichael Damarea Merkel, MD [facility] [phone] 7:31 PM

## 2013-11-13 NOTE — ED Notes (Signed)
Report given to Al CorpusJon Cook, RN

## 2013-11-13 NOTE — Consult Note (Signed)
Reason for Consult:Unknown trauma mechanism Referring Physician: [name] is an 63 y.o. female.  HPI: [name] was found by EMS in her bathroom being propped up by her mentally challenged son. She had a decreased LOC at that time, only able to say Codeine over and over after being asked her allergies by EMS. Her son could not contribute to history. She became apneic and was nasally intubated in the field. She had a lot of effluent (emesis?) from the tube and they removed it. She did not come in as a trauma but was called a level 1 on arrival once it was noticed that she had bilateral ankle fractures. She was intubated by the EDP.  Spoke with niece who was present. States pt had been c/o of cold for several days. States pt was on commode c/o of SOB and difficulty breathing - was "shaking" bc couldn't breathe. She was also making repetitive comments. They called EMS bc of resp distress. They decided to get her back in wheelchair since EMS on way. As pt was transferring to wheelchair, she fell. Didn't strike head as far as they know  On my arrival pt already intubated by ED. Initial machine BP was in 60-70sbp; unable to obtain manual; HR 120-140s. Opened fluids wide open. Pt was taken to CT with hypoTN.   After return to trauma bay, pt's HR drifted down to low 30s; BP80s. Was given atropine. HR improved. Several minutes pt became extremely tachy. HR 160-180s. Several EKGs done. ED attending at bedside. Pads reconnected. Pt HR eventually settled down to 120s.  Past Medical History  Diagnosis Date  . Asthma     with exaerbation and admission in 5/12. no h/ intubation. Is also suspected to have some degree of restrictive pattern and moderate emphysema  (per Dr. Janifer Adie note0 and h/o smoking in past.   . Obesity hypoventilation syndrome     followed with Dr. Don Broach ([facility]) once in 2012.   Marland Kitchen OSA (obstructive sleep apnea)     on CPAP qhs. Sleep study in 2009 confirmed. Not very compliant  with CPAP  . Diastolic CHF     Echo 6812 confirmed (poor acoustic window) , EF 55%,, mild RVH  . Physical deconditioning     wheelchair bound, unable to perforrm ADLs at home  . HLD (hyperlipidemia)     Past Surgical History  Procedure Laterality Date  . Cesarean section      Family History  Problem Relation Age of Onset  . COPD Father     Social History:  reports that she has quit smoking. She has never used smokeless tobacco. She reports that she does not drink alcohol or use illicit drugs.  Allergies:  Allergies  Allergen Reactions  . Codeine Nausea And Vomiting    Medications: I have reviewed the patient's current medications.  Results for orders placed during the hospital encounter of 11/13/13 (from the past 48 hour(s))  PREPARE FRESH FROZEN PLASMA     Status: None   Collection Time    11/13/13  4:34 PM      Result Value Ref Range   Unit Number [id number]     Blood Component Type LIQ PLASMA     Unit division 00     Status of Unit ISSUED     Unit tag comment VERBAL ORDERS PER DR [facility]     Transfusion Status OK TO TRANSFUSE     Unit Number [id number]     Blood Component Type  LIQ PLASMA     Unit division 00     Status of Unit ISSUED     Unit tag comment VERBAL ORDERS PER DR BEDNAR     Transfusion Status OK TO TRANSFUSE    TYPE AND SCREEN     Status: None   Collection Time    11/13/13  4:45 PM      Result Value Ref Range   ABO/RH(D) O POS     Antibody Screen NEG     Sample Expiration 11/16/2013     Unit Number [id number]     Blood Component Type RED CELLS,LR     Unit division 00     Status of Unit ISSUED     Unit tag comment VERBAL ORDERS PER DR [facility]     Transfusion Status PENDING     Crossmatch Result PENDING     Unit Number [id number]     Blood Component Type RED CELLS,LR     Unit division 00     Status of Unit ISSUED     Unit tag comment VERBAL ORDERS PER DR HYQMVH     Transfusion Status PENDING     Crossmatch Result PENDING      ABO/RH     Status: None   Collection Time    11/13/13  4:45 PM      Result Value Ref Range   ABO/RH(D) O POS    TROPONIN I     Status: None   Collection Time    11/13/13  4:53 PM      Result Value Ref Range   Troponin I <0.30  <0.30 ng/mL   Comment:            Due to the release kinetics of cTnI,     a negative result within the first hours     of the onset of symptoms does not rule out     myocardial infarction with certainty.     If myocardial infarction is still suspected,     repeat the test at appropriate intervals.  PRO B NATRIURETIC PEPTIDE     Status: Abnormal   Collection Time    11/13/13  4:53 PM      Result Value Ref Range   Pro B Natriuretic peptide (BNP) 215.0 (*) 0 - 125 pg/mL  CBG MONITORING, ED     Status: Abnormal   Collection Time    11/13/13  4:59 PM      Result Value Ref Range   Glucose-Capillary 267 (*) 70 - 99 mg/dL   Comment 1 Notify RN     Comment 2 Documented in Chart    COMPREHENSIVE METABOLIC PANEL     Status: Abnormal (Preliminary result)   Collection Time    11/13/13  5:00 PM      Result Value Ref Range   Sodium 140  137 - 147 mEq/L   Potassium 5.5 (*) 3.7 - 5.3 mEq/L   Chloride 93 (*) 96 - 112 mEq/L   CO2 37 (*) 19 - 32 mEq/L   Glucose, Bld 300 (*) 70 - 99 mg/dL   BUN 10  6 - 23 mg/dL   Creatinine, Ser 0.63  0.50 - 1.10 mg/dL   Calcium 9.3  8.4 - 10.5 mg/dL   Total Protein 8.4 (*) 6.0 - 8.3 g/dL   Albumin 3.1 (*) 3.5 - 5.2 g/dL   AST PENDING  0 - 37 U/L   ALT 35  0 - 35 U/L   Alkaline Phosphatase 115  39 - 117 U/L   Total Bilirubin <0.2 (*) 0.3 - 1.2 mg/dL   GFR calc non Af Amer >90  >90 mL/min   GFR calc Af Amer >90  >90 mL/min   Comment: (NOTE)     The eGFR has been calculated using the CKD EPI equation.     This calculation has not been validated in all clinical situations.     eGFR's persistently <90 mL/min signify possible Chronic Kidney     Disease.   Anion gap 10  5 - 15  CBC     Status: Abnormal   Collection Time     11/13/13  5:00 PM      Result Value Ref Range   WBC 13.9 (*) 4.0 - 10.5 K/uL   RBC 4.08  3.87 - 5.11 MIL/uL   Hemoglobin 12.6  12.0 - 15.0 g/dL   HCT 44.8  36.0 - 46.0 %   MCV 109.8 (*) 78.0 - 100.0 fL   MCH 30.9  26.0 - 34.0 pg   MCHC 28.1 (*) 30.0 - 36.0 g/dL   RDW 13.3  11.5 - 15.5 %   Platelets 278  150 - 400 K/uL  ETHANOL     Status: None   Collection Time    11/13/13  5:00 PM      Result Value Ref Range   Alcohol, Ethyl (B) <11  0 - 11 mg/dL   Comment:            LOWEST DETECTABLE LIMIT FOR     SERUM ALCOHOL IS 11 mg/dL     FOR MEDICAL PURPOSES ONLY  PROTIME-INR     Status: None   Collection Time    11/13/13  5:00 PM      Result Value Ref Range   Prothrombin Time 13.0  11.6 - 15.2 seconds   INR 0.98  0.00 - 1.49  I-STAT ARTERIAL BLOOD GAS, ED     Status: Abnormal   Collection Time    11/13/13  5:54 PM      Result Value Ref Range   pH, Arterial 7.291 (*) 7.350 - 7.450   pCO2 arterial 75.6 (*) 35.0 - 45.0 mmHg   pO2, Arterial 377.0 (*) 80.0 - 100.0 mmHg   Bicarbonate 36.4 (*) 20.0 - 24.0 mEq/L   TCO2 39  0 - 100 mmol/L   O2 Saturation 100.0     Acid-Base Excess 7.0 (*) 0.0 - 2.0 mmol/L   Patient temperature 98.6 F     Collection site RADIAL, ALLEN'S TEST ACCEPTABLE     Drawn by Operator     Sample type ARTERIAL     Comment NOTIFIED PHYSICIAN      Ct Head Wo Contrast  11/13/2013   CLINICAL DATA:  Altered mental status post trauma  EXAM: CT HEAD WITHOUT CONTRAST  CT CERVICAL SPINE WITHOUT CONTRAST  TECHNIQUE: Multidetector CT imaging of the head and cervical spine was performed following the standard protocol without intravenous contrast. Multiplanar CT image reconstructions of the cervical spine were also generated.  COMPARISON:  Head CT February 05, 2007  FINDINGS: CT HEAD FINDINGS  The ventricles are normal in size and configuration. There is no appreciable mass, hemorrhage, extra-axial fluid collection, or midline shift. The gray-white compartments are normal. Bony  calvarium appears intact. Mastoid air cells are clear. There is diffuse opacification of both nares. There is bilateral maxillary and ethmoid sinus disease. Note that patient is intubated.  CT CERVICAL SPINE FINDINGS  There is no appreciable fracture  or spondylolisthesis. Prevertebral soft tissues and predental space regions are normal. There is moderately severe disc space narrowing at C5-6 and C6-7. There is moderate narrowing at C3-4, C4-5, and C7-T1. There is facet osteoarthritic change to varying degrees at all levels bilaterally. There is no frank disc extrusion or stenosis. Note that there is a nasogastric tube and endotracheal tube present.  IMPRESSION: CT head: No intracranial mass, hemorrhage, or extra-axial fluid. No acute infarct. Extensive opacification of the nares bilaterally as well as multifocal paranasal sinus disease.  CT cervical spine: Multifocal osteoarthritic change. No fracture or spondylolisthesis.   Electronically Signed   By: Bretta Bang M.D.   On: 11/13/2013 17:53   Ct Chest W Contrast  11/13/2013   CLINICAL DATA:  Fall out of wheelchair. Chest and abdominal injury. Acute respiratory failure. Intubated.  EXAM: CT CHEST, ABDOMEN, AND PELVIS WITH CONTRAST  TECHNIQUE: Multidetector CT imaging of the chest, abdomen and pelvis was performed following the standard protocol during bolus administration of intravenous contrast.  CONTRAST:  OMNIPAQUE IOHEXOL 300 MG/ML  SOLN  COMPARISON:  Chest CT on 06/24/2011  FINDINGS: CT CHEST FINDINGS  Mediastinum/Hilar Regions: No masses or pathologically enlarged lymph nodes identified.  Lungs: Mild emphysema noted. Bibasilar scarring again demonstrated. New mild heterogeneous airspace disease is seen in the medial left upper lobe, with which is nonspecific and could be due to contusion, aspiration, or pneumonia.  Pleura: No evidence of effusion or mass. No evidence of pneumothorax.  Vascular/Cardiac: No evidence of thoracic aortic injury or  mediastinal hematoma.  Musculoskeletal: No suspicious bone lesions identified. No acute fractures identified.  Other: Endotracheal tube and nasogastric tube are seen in appropriate position.  CT ABDOMEN AND PELVIS FINDINGS  Liver: No parenchymal lacerations or masses identified. Diffuse hepatic steatosis again demonstrated.  Gallbladder/Biliary: Mural pattern of calcification is suspicious for porcelain gallbladder. Small gallstones cannot be excluded.  Pancreas: No mass, inflammatory changes, or other parenchymal abnormality identified.  Spleen:  Within normal limits in size and appearance.  Adrenal Glands:  No mass identified.  Kidneys/Urinary Tract: No masses identified. No evidence of hydronephrosis. Small left renal cysts again noted. Foley catheter seen within the bladder.  Lymph Nodes:  No pathologically enlarged lymph nodes identified.  Bowel: Mild sigmoid diverticulosis again demonstrated. No evidence of diverticulitis or other significant abnormality.  Pelvic/Reproductive Organs: No mass or other significant abnormality identified.  Vascular:  No evidence of abdominal aortic aneurysm.  Musculoskeletal:  No suspicious bone lesions identified.  Other:  None.  IMPRESSION: Mild nonspecific pulmonary infiltrate in medial left upper lobe. Differential diagnosis includes pulmonary contusion, aspiration, or pneumonia. No other traumatic injury identified.  Emphysema.  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 Small calcified gallstones cannot be excluded.  Diverticulosis. No radiographic evidence of diverticulitis.   Electronically Signed   By: Myles Rosenthal M.D.   On: 11/13/2013 18:04   Ct Cervical Spine Wo Contrast  11/13/2013   CLINICAL DATA:  Altered mental status post trauma  EXAM: CT HEAD WITHOUT CONTRAST  CT CERVICAL SPINE WITHOUT CONTRAST  TECHNIQUE: Multidetector CT imaging of the head and cervical spine was performed following the standard protocol without intravenous contrast.  Multiplanar CT image reconstructions of the cervical spine were also generated.  COMPARISON:  Head CT February 05, 2007  FINDINGS: CT HEAD FINDINGS  The ventricles are normal in size and configuration. There is no appreciable mass, hemorrhage, extra-axial fluid collection, or midline shift. The gray-white compartments are normal. Bony calvarium appears intact. Mastoid air  cells are clear. There is diffuse opacification of both nares. There is bilateral maxillary and ethmoid sinus disease. Note that patient is intubated.  CT CERVICAL SPINE FINDINGS  There is no appreciable fracture or spondylolisthesis. Prevertebral soft tissues and predental space regions are normal. There is moderately severe disc space narrowing at C5-6 and C6-7. There is moderate narrowing at C3-4, C4-5, and C7-T1. There is facet osteoarthritic change to varying degrees at all levels bilaterally. There is no frank disc extrusion or stenosis. Note that there is a nasogastric tube and endotracheal tube present.  IMPRESSION: CT head: No intracranial mass, hemorrhage, or extra-axial fluid. No acute infarct. Extensive opacification of the nares bilaterally as well as multifocal paranasal sinus disease.  CT cervical spine: Multifocal osteoarthritic change. No fracture or spondylolisthesis.   Electronically Signed   By: Lowella Grip M.D.   On: 11/13/2013 17:53   Ct Abdomen Pelvis W Contrast  11/13/2013   CLINICAL DATA:  Fall out of wheelchair. Chest and abdominal injury. Acute respiratory failure. Intubated.  EXAM: CT CHEST, ABDOMEN, AND PELVIS WITH CONTRAST  TECHNIQUE: Multidetector CT imaging of the chest, abdomen and pelvis was performed following the standard protocol during bolus administration of intravenous contrast.  CONTRAST:  120mL OMNIPAQUE IOHEXOL 300 MG/ML  SOLN  COMPARISON:  Chest CT on 06/24/2011  FINDINGS: CT CHEST FINDINGS  Mediastinum/Hilar Regions: No masses or pathologically enlarged lymph nodes identified.  Lungs: Mild  emphysema noted. Bibasilar scarring again demonstrated. New mild heterogeneous airspace disease is seen in the medial left upper lobe, with which is nonspecific and could be due to contusion, aspiration, or pneumonia.  Pleura: No evidence of effusion or mass. No evidence of pneumothorax.  Vascular/Cardiac: No evidence of thoracic aortic injury or mediastinal hematoma.  Musculoskeletal: No suspicious bone lesions identified. No acute fractures identified.  Other: Endotracheal tube and nasogastric tube are seen in appropriate position.  CT ABDOMEN AND PELVIS FINDINGS  Liver: No parenchymal lacerations or masses identified. Diffuse hepatic steatosis again demonstrated.  Gallbladder/Biliary: Mural pattern of calcification is suspicious for porcelain gallbladder. Small gallstones cannot be excluded.  Pancreas: No mass, inflammatory changes, or other parenchymal abnormality identified.  Spleen:  Within normal limits in size and appearance.  Adrenal Glands:  No mass identified.  Kidneys/Urinary Tract: No masses identified. No evidence of hydronephrosis. Small left renal cysts again noted. Foley catheter seen within the bladder.  Lymph Nodes:  No pathologically enlarged lymph nodes identified.  Bowel: Mild sigmoid diverticulosis again demonstrated. No evidence of diverticulitis or other significant abnormality.  Pelvic/Reproductive Organs: No mass or other significant abnormality identified.  Vascular:  No evidence of abdominal aortic aneurysm.  Musculoskeletal:  No suspicious bone lesions identified.  Other:  None.  IMPRESSION: Mild nonspecific pulmonary infiltrate in medial left upper lobe. Differential diagnosis includes pulmonary contusion, aspiration, or pneumonia. No other traumatic injury identified.  Emphysema.  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 Small calcified gallstones cannot be excluded.  Diverticulosis. No radiographic evidence of diverticulitis.   Electronically Signed   By: Earle Gell M.D.   On: 11/13/2013 18:04   Dg Chest Port 1 View  11/13/2013   CLINICAL DATA:  Hypoxia  EXAM: PORTABLE CHEST - 1 VIEW  COMPARISON:  Chest radiograph and chest CT June 24, 2011  FINDINGS: A portion of the left base is not imaged. Endotracheal tube tip is 4.5 cm above the carina. Nasogastric tube tip and side port are below the diaphragm. There is no appreciable pneumothorax in the regions which  are visualized.  There is elevation of the right hemidiaphragm. No edema or consolidation. Heart size and pulmonary vascularity are normal. No adenopathy. No bone lesions are appreciable in visualized regions.  IMPRESSION: Tube positions as described. No pneumothorax appreciable. Note that a portion of the left lateral base region is not visualized.  No edema or consolidation identified in regions which are visualized.   Electronically Signed   By: Lowella Grip M.D.   On: 11/13/2013 17:26    Review of Systems  Unable to perform ROS: intubated   Blood pressure 89/63, pulse 134, resp. rate 14, height $RemoveBe'5\' 8"'bWsfisslw$  (1.727 m), SpO2 100.00%. Physical Exam  Vitals reviewed. Constitutional: She appears well-developed and well-nourished. She is cooperative. She appears distressed. Cervical collar and nasal cannula in place.  HENT:  Head: Normocephalic and atraumatic. Head is without raccoon's eyes, without Battle's sign, without abrasion, without contusion and without laceration.  Right Ear: External ear normal. No lacerations. No drainage or tenderness. No foreign bodies.  Left Ear: Tympanic membrane, external ear and ear canal normal. No lacerations. No drainage or tenderness. No foreign bodies. Tympanic membrane is not perforated. No hemotympanum.  Ears:  Nose: Nose normal. No nose lacerations, sinus tenderness, nasal deformity or nasal septal hematoma. No epistaxis.  Mouth/Throat: Uvula is midline, oropharynx is clear and moist and mucous membranes are normal. No lacerations.  Eyes: Conjunctivae and lids  are normal. Right eye exhibits no discharge. Left eye exhibits no discharge. No scleral icterus.  Pupils equal, nonreactive  Neck: Trachea normal. No JVD present. No spinous process tenderness and no muscular tenderness present. Carotid bruit is not present. No tracheal deviation present. No thyromegaly present.  +collar  Cardiovascular: Normal rate, regular rhythm, normal heart sounds and normal pulses.  Exam reveals no gallop and no friction rub.   No murmur heard. Pulses:      Dorsalis pedis pulses are 2+ on the right side.       Posterior tibial pulses are 2+ on the right side.  No signal in L dp/pt  Respiratory: Breath sounds normal. No stridor. She is in respiratory distress. She has no wheezes. She has no rales. She exhibits no tenderness, no bony tenderness, no laceration and no crepitus.  GI: Soft. Normal appearance. She exhibits no distension. Bowel sounds are decreased. There is no tenderness. There is no rigidity and no CVA tenderness.  Morbidly obese  Genitourinary: Vagina normal.  Musculoskeletal: She exhibits no edema.       Right ankle: She exhibits deformity.       Left ankle: She exhibits deformity and laceration.       Feet:  Open wound medial superiorly to ankle, abut 1cm some ooze. nonpalp DP/PT.   Lymphadenopathy:    She has no cervical adenopathy.  Neurological: She is unresponsive. No cranial nerve deficit or sensory deficit. GCS eye subscore is 1. GCS verbal subscore is 1. GCS motor subscore is 1.  While in CT, pt started spont moving b/l LE  Skin: Skin is warm, dry and intact. She is not diaphoretic.  Thickened dry skin, elephant skin  Psychiatric: Her speech is normal.    Assessment/Plan: Acute respiratory failure  Shock Fall Open L ankle fracture Probable R ankle fracture Hypotension Morbid obesity OSA Obesity hypoventilation syndrome H/o CHF Hyperglycemia Left upper lobe infiltrate - aspiration vs PNA (per family, pt had cold for past several  days)   It appears inciting event was some form of respiratory distress followed by fall which caused ankle  fractures. Given the complexity of underlying pulmonary disease, rec   Admission by CCM (discussed with Dr Halford Chessman) Cardiology consult (called by Dr Doy Mince) Ortho consult (Dr Erlinda Hong) - rest of work up for LE including foot per ortho - suspicion for vascular injury low - pt has very difficult exam given her chronic skin changes) Ancef & Norva Karvonen for initial mgmt of open ankle fx Tetanus  Discussed with family Not much for [place] to offer from trauma standpoint  I spent 120 minutes critical time with the patient, reviewing films, labs, managing bradycardia, evaluating pt, coordinating care, and counseling family.   Leighton Ruff. Redmond Pulling, MD, FACS General, Bariatric, & Minimally Invasive Surgery [facility], [place]

## 2013-11-13 NOTE — ED Notes (Addendum)
PCCM Dr. Kendrick FriesMcQuaid here to see, pending admission orders. Report received from Beryle QuantKim F. RN, family remains in conference room, Dr. Roda ShuttersXu finished splinting BLE. [facility]. BP low. IVF boluses infusing.  Pt lightly sedated, some raising of L arm. Ancef complete. Tdap given.

## 2013-11-13 NOTE — Progress Notes (Signed)
Chaplain received page that family had arrived for patient. Chaplain escorted family to consultation room B, providing hospitality, drinks, blankets, emotional support and spiritual care through prayer and presence. Acted as Print production plannerliaison between Development worker, communitymedical team and family. Present during physician's update with family. Escorted family to pt's bedside and provided emotional support. Family said patient lives at home with her son. Family in consultation room B waiting for further updates and for pt to receive bed assignment. They appreciated support. Will refer for follow up. Please page if needed.   Maurene CapesHillary D Irusta [phone]

## 2013-11-13 NOTE — ED Notes (Signed)
Attempted report 

## 2013-11-13 NOTE — Progress Notes (Signed)
Orthopedic Tech Progress Note Patient Details:  Noralee StainCora E Odland July 05, 1950 [id number]  Ortho Devices Type of Ortho Device: Ace wrap;Stirrup splint;Post (short leg) splint Ortho Device/Splint Location: LLE Ortho Device/Splint Interventions: Ordered;Application   Jennye MoccasinHughes, Merlen Gurry Craig 11/13/2013, 5:58 PM

## 2013-11-13 NOTE — Progress Notes (Signed)
[facility] Result taken by: Marisue IvanLiz RN  Critical Lab: ABG Results  Time Called: 2222

## 2013-11-13 NOTE — Progress Notes (Signed)
Pt arrived to 2MW on Propofol gtt from ED; d/c'd Proprofol and started on Fentanyl gtt at 2145... Marvia PicklesJames, Atsushi Yom Sara, RN

## 2013-11-13 NOTE — H&P (Signed)
PULMONARY / CRITICAL CARE MEDICINE   Name: [name] MRN: [id number] DOB: 1951/03/11    ADMISSION DATE:  11/13/2013 CONSULTATION DATE:  11/13/2013  REFERRING MD :  Loretha StaplerWofford, EDP  CHIEF COMPLAINT:  Dyspnea, trauma  INITIAL PRESENTATION: 63 y/o female with severe COPD and obesity hypoventilation syndrome was [facility].  She had severe hypercapnic respiratory failure requiring mechanical ventilation.  Just prior to admission she fell breaking both ankles.    STUDIES:  8/11 CT C/A/P > nonspecific infiltrate LUL, Emphysema, hepatic steatosis, porcelain gallbladder, diverticulosis 8/11 CT head/c-spine> NAICP, but extensive opacification of paranasal sinuses, C spint multifocal osteoarthritic change, no fracture 8/11 bilateral ankle films > R tib-fib fracture, left distal fibula fracture and medial malleolus  SIGNIFICANT EVENTS:   HISTORY OF PRESENT ILLNESS:  63 y/o female [facility] on [facility]11 with acute hypercapnic respiratory failure and bilateral lower extremity fractures.  History is obtained by the family because the patient is encephalopathic.  She had been complaining of a cold, cough, sinus congestion and worsening dyspnea for 2-3 days prior to admission.  Her son tried to convince her to come to the ED on 8/11 after she complained of worsening dyspnea and fatigue, but she refused.  Later she went to the bathroom and then started complaining of severe dyspnea.  He called paramedics and his aunt who came to assess.  The patient tried to stand but fell, fracturing both ankles (see X-ray above).  Her family noted that her respiratory status worsened and she became unresponsive.  She was also noted to have multiple scattered twitching movements throughout.  In the ED she was not hypoxemic but was essentially unresponsive so she was intubated.   She was found to have a compound R ankle fracture and underwent washout and splinting by orthopedics in the ED.  PAST  MEDICAL HISTORY :  Past Medical History  Diagnosis Date  . Asthma     with exaerbation and admission in 5/12. no h/ intubation. Is also suspected to have some degree of restrictive pattern and moderate emphysema  (per Dr. Teddy Spikelance's note0 and h/o smoking in past.   . Obesity hypoventilation syndrome     followed with Dr. Stanton KidneyKieth Clance ([facility]) once in 2012.   Marland Kitchen. OSA (obstructive sleep apnea)     on CPAP qhs. Sleep study in 2009 confirmed. Not very compliant with CPAP  . Diastolic CHF     Echo 2009 confirmed (poor acoustic window) , EF 55%,, mild RVH  . Physical deconditioning     wheelchair bound, unable to perforrm ADLs at home  . HLD (hyperlipidemia)    Past Surgical History  Procedure Laterality Date  . Cesarean section     Prior to Admission medications   Medication Sig Start Date End Date Taking? Authorizing Provider  aspirin EC 81 MG tablet Take 81 mg by mouth daily.    Historical Provider, MD  fluticasone-salmeterol (ADVAIR HFA) 115-21 MCG/ACT inhaler Inhale 2 puffs into the lungs 2 (two) times daily. 06/28/11   Danley DankerBradley W Wainright, MD  pravastatin (PRAVACHOL) 10 MG tablet Take 10 mg by mouth daily.    Historical Provider, MD  predniSONE (DELTASONE) 10 MG tablet Take 4 pills by mouth daily for 3 days, then 3 pills daily for 3 days, then 2 pills daily for 3 days, then 1 pill daily for 3 days, thenstop 06/28/11   Danley DankerBradley W Wainright, MD   Allergies  Allergen Reactions  . Codeine  Nausea And Vomiting    FAMILY HISTORY/SOCIAL HISTORY/REVIEW OF SYSTEMS:  Cannot obtain due to intubation  SUBJECTIVE:   VITAL SIGNS: Temp:  [96.8 F (36 C)] 96.8 F (36 C) (08/11 1637) Pulse Rate:  [90-175] 108 (08/11 1930) Resp:  [11-24] 24 (08/11 1930) BP: (60-109)/(45-78) 98/75 mmHg (08/11 1930) SpO2:  [96 %-100 %] 99 % (08/11 1930) FiO2 (%):  [40 %-100 %] 40 % (08/11 1808) Weight:  [118.4 kg (261 lb 0.4 oz)-130.001 kg (286 lb 9.6 oz)] 130.001 kg (286 lb 9.6 oz) (08/11 1924) HEMODYNAMICS:    VENTILATOR SETTINGS: Vent Mode:  [-] PRVC FiO2 (%):  [40 %-100 %] 40 % Set Rate:  [14 bmp-24 bmp] 24 bmp Vt Set:  [510 mL] 510 mL PEEP:  [5 cmH20] 5 cmH20 Plateau Pressure:  [32 cmH20-36 cmH20] 32 cmH20 INTAKE / OUTPUT:  Intake/Output Summary (Last 24 hours) at 11/13/13 1949 Last data filed at 11/13/13 1937  Gross per 24 hour  Intake     50 ml  Output     20 ml  Net     30 ml    PHYSICAL EXAMINATION: Gen: sedated on vent HEENT: bloody secretions from mouth, PERRL, EOMi, ETT in place PULM: diminished breath sounds bilaterally CV: Tachy, regular, no mgr Ab: BS+, soft, nontender Ext: warm, bilateral splints in place Derm: diffuse dryness, no clear rash Neuro: Sedated on vent  LABS:  CBC  Recent Labs Lab 11/13/13 1700  WBC 13.9*  HGB 12.6  HCT 44.8  PLT 278   Coag's  Recent Labs Lab 11/13/13 1700  INR 0.98   BMET  Recent Labs Lab 11/13/13 1700  NA 140  K 5.5*  CL 93*  CO2 37*  BUN 10  CREATININE 0.63  GLUCOSE 300*   Electrolytes  Recent Labs Lab 11/13/13 1700  CALCIUM 9.3   Sepsis Markers No results found for this basename: LATICACIDVEN, PROCALCITON, O2SATVEN,  in the last 168 hours ABG  Recent Labs Lab 11/13/13 1754  PHART 7.291*  PCO2ART 75.6*  PO2ART 377.0*   Liver Enzymes  Recent Labs Lab 11/13/13 1700  AST 41*  ALT 35  ALKPHOS 115  BILITOT <0.2*  ALBUMIN 3.1*   Cardiac Enzymes  Recent Labs Lab 11/13/13 1653  TROPONINI <0.30  PROBNP 215.0*   Glucose  Recent Labs Lab 11/13/13 1659  GLUCAP 267*    Imaging No results found.   ASSESSMENT / PLAN:  PULMONARY OETT 8/11 >> A: Acute hypercapnic respiratory failure most likely due to acute exacerbation of COPD; ddx includes PE but this is less likely given history of worsening dyspnea with a cold for several days  P:   Full vent support Repeat ABG after bronchodilators/steroids Maintain current respiratory rate on vent for now Scheduled  solumedrol CXR/ABG/SBT/WUA in AM D-dimer> if positive check LE doppler Echo  CARDIOVASCULAR CVL none A: Sinus tachycardia Diastolic CHF not in exacerbation P:  Echo Tele Cycle cardiac enzymes  RENAL A:  No acute issues P:   Monitor UOP/BMET  GASTROINTESTINAL A:  No acute issues P:   OG tube Tube feedings Pepcid for stress ulcer prophylaxis  HEMATOLOGIC A:  No acute issues P:  Monitor for bleeding Lovenox for DVT prophylaxis  INFECTIOUS A:  AE COPD Compound R tib-fib fracture P:   Sputum culture 8/11 >> Abx: Ceftriaxone 8/11, day 1/7  ENDOCRINE A:  Hyperglycemia, no history of DM2 P:   ICU hyperglycemia protocol  NEUROLOGIC A:  Acute encephalopathy> sounds classic for hypercapnea P:   RASS  goal: -1 Fentanyl gtt Daily WUA  Ortho A: Bilateral LE fractures P:  OK for OR 8/12  TODAY'S SUMMARY: Acute hypercapnic respiratory failure due to AE COPD leading to a fall with bilateral lower extremity fractures. Plan full vent support, treatment of AE COPD, to OR as soon as 8/12 if condition remains stable.  I have personally obtained a history, examined the patient, evaluated laboratory and imaging results, formulated the assessment and plan and placed orders. CRITICAL CARE: The patient is critically ill with multiple organ systems failure and requires high complexity decision making for assessment and support, frequent evaluation and titration of therapies, application of advanced monitoring technologies and extensive interpretation of multiple databases. Critical Care Time devoted to patient care services described in this note is 60 minutes.   Heber Brightwaters, MD [place] [facility] Pager: [phone] Cell: [phone] If no response, call [phone]   11/13/2013, 7:49 PM

## 2013-11-13 NOTE — ED Notes (Signed)
Paged Dr. Anne FuSkains to [phone]

## 2013-11-13 NOTE — Progress Notes (Signed)
eLink Physician-Brief Progress Note Patient Name: Noralee StainCora E Duran DOB: 24-Sep-1950 MRN: [id number]  Date of Service  11/13/2013   HPI/Events of Note   Agitated and low BP  eICU Interventions   Will give prn versed, and fluid bolus   Intervention Category Major Interventions: Other:  Natasha Burda 11/13/2013, 10:15 PM

## 2013-11-13 NOTE — ED Notes (Signed)
Family at beside. Family given emotional support. Chaplin with pt.

## 2013-11-13 NOTE — ED Notes (Signed)
Attempted report, [facility], monitoring low BPs. Remains lightly sedated, breathing with vent.

## 2013-11-13 NOTE — ED Notes (Signed)
To CT at this time.

## 2013-11-13 NOTE — ED Provider Notes (Signed)
CSN: [id number]     Arrival date & time 11/13/13  1634 History   First MD Initiated Contact with Patient 11/13/13 1650     Chief Complaint  Patient presents with  . Respiratory Arrest     (Consider location/radiation/quality/duration/timing/severity/associated sxs/prior Treatment) HPI Comments: 63 yo female presenting via EMS unresponsive.  History is very limited due to pt's unresponsiveness, level V Caveat applies.  Per EMS, she fell from her wheelchair, fracturing both legs.  She was initially awake on their arrival, but GCS of 6.  They attempted nasal intubation unsuccessfully.  On arrival, pt was GCS 3.     Past Medical History  Diagnosis Date  . Asthma     with exaerbation and admission in 5/12. no h/ intubation. Is also suspected to have some degree of restrictive pattern and moderate emphysema  (per Dr. Teddy Spikelance's note0 and h/o smoking in past.   . Obesity hypoventilation syndrome     followed with Dr. Stanton KidneyKieth Clance ([facility]) once in 2012.   Marland Kitchen. OSA (obstructive sleep apnea)     on CPAP qhs. Sleep study in 2009 confirmed. Not very compliant with CPAP  . Diastolic CHF     Echo 2009 confirmed (poor acoustic window) , EF 55%,, mild RVH  . Physical deconditioning     wheelchair bound, unable to perforrm ADLs at home  . HLD (hyperlipidemia)    Past Surgical History  Procedure Laterality Date  . Cesarean section     Family History  Problem Relation Age of Onset  . COPD Father    History  Substance Use Topics  . Smoking status: Former Smoker -- 10 years  . Smokeless tobacco: Never Used  . Alcohol Use: No     Comment: " drank beer years ago "   OB History   Grav Para Term Preterm Abortions TAB SAB Ect Mult Living                 Review of Systems  Unable to perform ROS     Allergies  Codeine  Home Medications   Prior to Admission medications   Medication Sig Start Date End Date Taking? Authorizing Provider  aspirin EC 81 MG tablet Take 81 mg by mouth daily.     Historical Provider, MD  fluticasone-salmeterol (ADVAIR HFA) 115-21 MCG/ACT inhaler Inhale 2 puffs into the lungs 2 (two) times daily. 06/28/11   Danley DankerBradley W Wainright, MD  pravastatin (PRAVACHOL) 10 MG tablet Take 10 mg by mouth daily.    Historical Provider, MD  predniSONE (DELTASONE) 10 MG tablet Take 4 pills by mouth daily for 3 days, then 3 pills daily for 3 days, then 2 pills daily for 3 days, then 1 pill daily for 3 days, thenstop 06/28/11   Danley DankerBradley W Wainright, MD   There were no vitals taken for this visit. Physical Exam  Nursing note and vitals reviewed. Constitutional: She appears well-developed and well-nourished. No distress.  Morbidly obese  HENT:  Head: Normocephalic and atraumatic. Head is without raccoon's eyes and without Battle's sign.  Nose: Nose normal.  Eyes: Conjunctivae and EOM are normal. Pupils are equal, round, and reactive to light. No scleral icterus.  Neck: No spinous process tenderness and no muscular tenderness present.  Cardiovascular: Normal rate, regular rhythm, normal heart sounds and intact distal pulses.   No murmur heard. Pulmonary/Chest: Effort normal and breath sounds normal. She has no rales. She exhibits no tenderness.  Abdominal: Soft. There is no tenderness. There is no rebound and  no guarding.  Musculoskeletal: Normal range of motion. She exhibits no edema and no tenderness.       Right ankle: She exhibits deformity. She exhibits normal pulse.       Left ankle: She exhibits deformity and abnormal pulse.       Thoracic back: She exhibits no tenderness and no bony tenderness.       Lumbar back: She exhibits no tenderness and no bony tenderness.  No evidence of trauma to extremities, except as noted.  2+ distal pulses.    Neurological: She is unresponsive. GCS eye subscore is 1. GCS verbal subscore is 1. GCS motor subscore is 1.  Skin: Skin is warm and dry.  Psychiatric:  Unable to test    ED Course  CRITICAL CARE Performed by: Blake Divine  DAVID III Authorized by: Blake Divine DAVID III Total critical care time: 55 minutes Critical care time was exclusive of separately billable procedures and treating other patients. Critical care was necessary to treat or prevent imminent or life-threatening deterioration of the following conditions: CNS failure or compromise, respiratory failure and trauma. Critical care was time spent personally by me on the following activities: development of treatment plan with patient or surrogate, discussions with consultants, evaluation of patient's response to treatment, examination of patient, obtaining history from patient or surrogate, ordering and performing treatments and interventions, ordering and review of laboratory studies, ordering and review of radiographic studies, pulse oximetry, re-evaluation of patient's condition and review of old charts.  INTUBATION Date/Time: 11/14/2013 1:30 PM Performed by: Blake Divine DAVID III Authorized by: Blake Divine DAVID III Consent: The procedure was performed in an emergent situation. Indications: respiratory failure and  airway protection Intubation method: video-assisted Patient status: unconscious Laryngoscope size: glidescope 3. Tube size: 7.5 mm Tube type: cuffed Number of attempts: 1 Cords visualized: yes Post-procedure assessment: chest rise and CO2 detector Breath sounds: equal Cuff inflated: yes ETT to lip: 23 cm Tube secured with: ETT holder Chest x-ray interpreted by me, other physician and radiologist. Chest x-ray findings: endotracheal tube in appropriate position Patient tolerance: Patient tolerated the procedure well with no immediate complications.  ORTHOPEDIC INJURY TREATMENT Date/Time: 11/14/2013 1:31 PM Performed by: Blake Divine DAVID III Authorized by: Blake Divine DAVID III Consent: The procedure was performed in an emergent situation. Injury location: ankle Location details: left ankle Injury type:  fracture-dislocation Fracture type: trimalleolar Pre-procedure distal perfusion: absent Pre-procedure neurological function: absent Pre-procedure range of motion: reduced Manipulation performed: yes Skin traction used: yes Reduction successful: yes Post-procedure distal perfusion: diminished Post-procedure neurological function: absent Post-procedure range of motion: unchanged Patient tolerance: Patient tolerated the procedure well with no immediate complications. Comments: Left open ankle fracture emergently reduced during secondary survey secondary to decreased perfusion and pulses.     (including critical care time) Labs Review All labs drawn in ED reviewed.  Imaging Review Ct Head Wo Contrast  11/13/2013   CLINICAL DATA:  Altered mental status post trauma  EXAM: CT HEAD WITHOUT CONTRAST  CT CERVICAL SPINE WITHOUT CONTRAST  TECHNIQUE: Multidetector CT imaging of the head and cervical spine was performed following the standard protocol without intravenous contrast. Multiplanar CT image reconstructions of the cervical spine were also generated.  COMPARISON:  Head CT February 05, 2007  FINDINGS: CT HEAD FINDINGS  The ventricles are normal in size and configuration. There is no appreciable mass, hemorrhage, extra-axial fluid collection, or midline shift. The gray-white compartments are normal. Bony calvarium appears intact. Mastoid air cells are clear. There is diffuse  opacification of both nares. There is bilateral maxillary and ethmoid sinus disease. Note that patient is intubated.  CT CERVICAL SPINE FINDINGS  There is no appreciable fracture or spondylolisthesis. Prevertebral soft tissues and predental space regions are normal. There is moderately severe disc space narrowing at C5-6 and C6-7. There is moderate narrowing at C3-4, C4-5, and C7-T1. There is facet osteoarthritic change to varying degrees at all levels bilaterally. There is no frank disc extrusion or stenosis. Note that there is a  nasogastric tube and endotracheal tube present.  IMPRESSION: CT head: No intracranial mass, hemorrhage, or extra-axial fluid. No acute infarct. Extensive opacification of the nares bilaterally as well as multifocal paranasal sinus disease.  CT cervical spine: Multifocal osteoarthritic change. No fracture or spondylolisthesis.   Electronically Signed   By: Bretta Bang M.D.   On: 11/13/2013 17:53   Ct Chest W Contrast  11/13/2013   CLINICAL DATA:  Fall out of wheelchair. Chest and abdominal injury. Acute respiratory failure. Intubated.  EXAM: CT CHEST, ABDOMEN, AND PELVIS WITH CONTRAST  TECHNIQUE: Multidetector CT imaging of the chest, abdomen and pelvis was performed following the standard protocol during bolus administration of intravenous contrast.  CONTRAST:  OMNIPAQUE IOHEXOL 300 MG/ML  SOLN  COMPARISON:  Chest CT on 06/24/2011  FINDINGS: CT CHEST FINDINGS  Mediastinum/Hilar Regions: No masses or pathologically enlarged lymph nodes identified.  Lungs: Mild emphysema noted. Bibasilar scarring again demonstrated. New mild heterogeneous airspace disease is seen in the medial left upper lobe, with which is nonspecific and could be due to contusion, aspiration, or pneumonia.  Pleura: No evidence of effusion or mass. No evidence of pneumothorax.  Vascular/Cardiac: No evidence of thoracic aortic injury or mediastinal hematoma.  Musculoskeletal: No suspicious bone lesions identified. No acute fractures identified.  Other: Endotracheal tube and nasogastric tube are seen in appropriate position.  CT ABDOMEN AND PELVIS FINDINGS  Liver: No parenchymal lacerations or masses identified. Diffuse hepatic steatosis again demonstrated.  Gallbladder/Biliary: Mural pattern of calcification is suspicious for porcelain gallbladder. Small gallstones cannot be excluded.  Pancreas: No mass, inflammatory changes, or other parenchymal abnormality identified.  Spleen:  Within normal limits in size and appearance.  Adrenal  Glands:  No mass identified.  Kidneys/Urinary Tract: No masses identified. No evidence of hydronephrosis. Small left renal cysts again noted. Foley catheter seen within the bladder.  Lymph Nodes:  No pathologically enlarged lymph nodes identified.  Bowel: Mild sigmoid diverticulosis again demonstrated. No evidence of diverticulitis or other significant abnormality.  Pelvic/Reproductive Organs: No mass or other significant abnormality identified.  Vascular:  No evidence of abdominal aortic aneurysm.  Musculoskeletal:  No suspicious bone lesions identified.  Other:  None.  IMPRESSION: Mild nonspecific pulmonary infiltrate in medial left upper lobe. Differential diagnosis includes pulmonary contusion, aspiration, or pneumonia. No other traumatic injury identified.  Emphysema.  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 Small calcified gallstones cannot be excluded.  Diverticulosis. No radiographic evidence of diverticulitis.   Electronically Signed   By: Myles Rosenthal M.D.   On: 11/13/2013 18:04   Ct Cervical Spine Wo Contrast  11/13/2013   CLINICAL DATA:  Altered mental status post trauma  EXAM: CT HEAD WITHOUT CONTRAST  CT CERVICAL SPINE WITHOUT CONTRAST  TECHNIQUE: Multidetector CT imaging of the head and cervical spine was performed following the standard protocol without intravenous contrast. Multiplanar CT image reconstructions of the cervical spine were also generated.  COMPARISON:  Head CT February 05, 2007  FINDINGS: CT HEAD FINDINGS  The ventricles are  normal in size and configuration. There is no appreciable mass, hemorrhage, extra-axial fluid collection, or midline shift. The gray-white compartments are normal. Bony calvarium appears intact. Mastoid air cells are clear. There is diffuse opacification of both nares. There is bilateral maxillary and ethmoid sinus disease. Note that patient is intubated.  CT CERVICAL SPINE FINDINGS  There is no appreciable fracture or spondylolisthesis.  Prevertebral soft tissues and predental space regions are normal. There is moderately severe disc space narrowing at C5-6 and C6-7. There is moderate narrowing at C3-4, C4-5, and C7-T1. There is facet osteoarthritic change to varying degrees at all levels bilaterally. There is no frank disc extrusion or stenosis. Note that there is a nasogastric tube and endotracheal tube present.  IMPRESSION: CT head: No intracranial mass, hemorrhage, or extra-axial fluid. No acute infarct. Extensive opacification of the nares bilaterally as well as multifocal paranasal sinus disease.  CT cervical spine: Multifocal osteoarthritic change. No fracture or spondylolisthesis.   Electronically Signed   By: Bretta Bang M.D.   On: 11/13/2013 17:53   Ct Abdomen Pelvis W Contrast  11/13/2013   CLINICAL DATA:  Fall out of wheelchair. Chest and abdominal injury. Acute respiratory failure. Intubated.  EXAM: CT CHEST, ABDOMEN, AND PELVIS WITH CONTRAST  TECHNIQUE: Multidetector CT imaging of the chest, abdomen and pelvis was performed following the standard protocol during bolus administration of intravenous contrast.  CONTRAST:  OMNIPAQUE IOHEXOL 300 MG/ML  SOLN  COMPARISON:  Chest CT on 06/24/2011  FINDINGS: CT CHEST FINDINGS  Mediastinum/Hilar Regions: No masses or pathologically enlarged lymph nodes identified.  Lungs: Mild emphysema noted. Bibasilar scarring again demonstrated. New mild heterogeneous airspace disease is seen in the medial left upper lobe, with which is nonspecific and could be due to contusion, aspiration, or pneumonia.  Pleura: No evidence of effusion or mass. No evidence of pneumothorax.  Vascular/Cardiac: No evidence of thoracic aortic injury or mediastinal hematoma.  Musculoskeletal: No suspicious bone lesions identified. No acute fractures identified.  Other: Endotracheal tube and nasogastric tube are seen in appropriate position.  CT ABDOMEN AND PELVIS FINDINGS  Liver: No parenchymal lacerations or  masses identified. Diffuse hepatic steatosis again demonstrated.  Gallbladder/Biliary: Mural pattern of calcification is suspicious for porcelain gallbladder. Small gallstones cannot be excluded.  Pancreas: No mass, inflammatory changes, or other parenchymal abnormality identified.  Spleen:  Within normal limits in size and appearance.  Adrenal Glands:  No mass identified.  Kidneys/Urinary Tract: No masses identified. No evidence of hydronephrosis. Small left renal cysts again noted. Foley catheter seen within the bladder.  Lymph Nodes:  No pathologically enlarged lymph nodes identified.  Bowel: Mild sigmoid diverticulosis again demonstrated. No evidence of diverticulitis or other significant abnormality.  Pelvic/Reproductive Organs: No mass or other significant abnormality identified.  Vascular:  No evidence of abdominal aortic aneurysm.  Musculoskeletal:  No suspicious bone lesions identified.  Other:  None.  IMPRESSION: Mild nonspecific pulmonary infiltrate in medial left upper lobe. Differential diagnosis includes pulmonary contusion, aspiration, or pneumonia. No other traumatic injury identified.  Emphysema.  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 Small calcified gallstones cannot be excluded.  Diverticulosis. No radiographic evidence of diverticulitis.   Electronically Signed   By: Myles Rosenthal M.D.   On: 11/13/2013 18:04   Dg Chest Port 1 View  11/13/2013   CLINICAL DATA:  Hypoxia  EXAM: PORTABLE CHEST - 1 VIEW  COMPARISON:  Chest radiograph and chest CT June 24, 2011  FINDINGS: A portion of the left base is not imaged.  Endotracheal tube tip is 4.5 cm above the carina. Nasogastric tube tip and side port are below the diaphragm. There is no appreciable pneumothorax in the regions which are visualized.  There is elevation of the right hemidiaphragm. No edema or consolidation. Heart size and pulmonary vascularity are normal. No adenopathy. No bone lesions are appreciable in visualized  regions.  IMPRESSION: Tube positions as described. No pneumothorax appreciable. Note that a portion of the left lateral base region is not visualized.  No edema or consolidation identified in regions which are visualized.   Electronically Signed   By: Bretta Bang M.D.   On: 11/13/2013 17:26   Dg Tibia/fibula Left Port  11/13/2013   CLINICAL DATA:  Postreduction film.  EXAM: PORTABLE LEFT TIBIA AND FIBULA - 2 VIEW  COMPARISON:  Left ankle radiograph 11/13/2013.  FINDINGS: Four views of the left tibia and fibula demonstrate interval close reduction and splint fixation of previously noted tibial and fibular fractures. Previously noted trimalleolar fracture of the ankle is again noted, however, alignment is significantly improved, with decreased displacement of each of the fractures. The medial malleolar fragment remains approximately 9 mm inferiorly displaced, and the lateral malleolar fragment remains approximately 2 mm posteriorly displace. The posterior malleolar fragment is also approximately 3 mm posteriorly displaced. There is a small amount of gas in the soft tissues anterior to the tibiotalar joint, which could suggest that this is an open fracture. There is also an oblique fracture through the proximal third of the fibular diaphysis with approximately 4 mm of anterior displacement.  IMPRESSION: 1. Interval closed reduction and splint fixation for trimalleolar fracture of the left ankle with improved alignment, as above. Small locule of gas in the soft tissues anterior to the tibiotalar joint may suggest that this is an open fracture. 2. There is also a minimally displaced oblique fracture through the proximal third of the fibular diaphysis, as above.   Electronically Signed   By: Trudie Reed M.D.   On: 11/13/2013 21:12   Dg Tibia/fibula Right Port  11/13/2013   CLINICAL DATA:  Postreduction ankle region fracture  EXAM: PORTABLE RIGHT TIBIA AND FIBULA - 2 VIEW  COMPARISON:  Right ankle November 13, 2013  FINDINGS: Frontal and lateral views were obtained with overlying immobilization device. The comminuted fracture of the distal fibula is again noted with major fracture fragments in overall near anatomic alignment. There is a fracture of the medial malleolus with alignment near anatomic in this area. Fracture of the posterior tibia is again noted with fracture fragments in near anatomic alignment. No new or apparent more proximal fractures. There appears to be reduction of the ankle mortise disruption.  IMPRESSION: Major fracture fragments in the ankle region or in near anatomic alignment. Previous ankle mortise disruption appears to have been reduced. No more proximal fractures.   Electronically Signed   By: Bretta Bang M.D.   On: 11/13/2013 21:12   Dg Ankle Left Port  11/13/2013   CLINICAL DATA:  Fall.  Ankle injury and pain.  EXAM: PORTABLE LEFT ANKLE - 1 VIEW  COMPARISON:  None.  FINDINGS: Only a single oblique view was obtained portably before the patient was taken to the catheterization lab. The shows fractures of the distal fibula and medial malleolus of the distal tibia. There is anterior and lateral subluxation of the talus with respect to the ankle mortise.  IMPRESSION: Limited one view exam shows fractures of the distal fibula and medial malleolus, with anterior and lateral subluxation of  the talus.   Electronically Signed   By: Myles Rosenthal M.D.   On: 11/13/2013 18:17   Dg Ankle Right Port  11/13/2013   CLINICAL DATA:  Found unresponsive.  Obvious deformity.  EXAM: PORTABLE RIGHT ANKLE - 2 VIEW  COMPARISON:  04/23/2007  FINDINGS: Portable AP and lateral views. Comminuted distal fibular fracture. Comminuted medial and posterior tibial fractures. Widening of the ankle mortise with relative posterior and lateral subluxation of the talar dome relative to the distal tibia. Base of fifth metatarsal intact. Overlying soft tissue swelling.  IMPRESSION: Comminuted fracture subluxation  involving the distal tibia and fibula. Suboptimally evaluated on these portable radiographs. Consider further evaluation with CT.   Electronically Signed   By: Jeronimo Greaves M.D.   On: 11/13/2013 18:32  All ED obtained images viewed independently by me.   EKG Interpretation   Date/Time:  Tuesday November 13 2013 17:08:30 EDT Ventricular Rate:  135 PR Interval:  104 QRS Duration: 87 QT Interval:  326 QTC Calculation: 489 R Axis:   92 Text Interpretation:  Sinus or ectopic atrial tachycardia Right axis  deviation Probable anteroseptal infarct, old No significant change was  found Confirmed by [facility]  MD, TREY ([id number]) on 11/14/2013 1:34:25 PM      MDM   Final diagnoses:  Acute respiratory failure with hypercapnia  Chronic diastolic heart failure  Congestive heart failure, unspecified  Dependence on supplemental oxygen  Morbid obesity  COPD exacerbation  Left trimalleolar ankle fracture, open  Acute encephalopathy  Fall Right closed ankle fracture  63 yo female who presented unresponsive with EMS.  They attempted nasal intubation, but were unsuccessful at obtained a definitive airway.  She arrived with a GCS of 3 with agonal respirations being assisted by BVM.  The initial report was that patient had fallen in the bathroom, sustained bilateral ankle fractures, was initially GCS of 6, but deteriorated in transport.  Due to report of fall, obvious orthopaedic injuries, and GCS of 3, I initiated a level I trauma code.  Her resuscitation was very challenging secondary to her morbid obesity.  She was resuscitated with IV fluids via peripheral lines.  Obtaining adequate FAST images was impossible.  There was reason to suspect that her GCS was not secondary to trauma.  Therefore, she was taken to the CT scanner despite her poor hemodynamics (as opposed to being taken directly to the OR).  When she returned to from the CT scanner, she began moving spontaneously but not purposefully.  She dropped her  HR to the 30's.  Dr. Andrey Campanile administered atropine without initial response.  Her EKG at this time was concerning for Wenckebach phenomenon.  She then went into a narrow complex tachycardic rhythm for several minutes before her HR settled into a steady, regular, tachycardic rhythm with rate in 120s.    Subsequently, her trauma evaluation revealed significant orthopedic trauma to her bilateral ankles, but no other identifiable traumatic injuries.  Additional information was obtained by Dr. Andrey Campanile (Trauma Surgery) that pt was in respiratory distress prior to her fall.     She was subsequently admitted by critical care medicine for further resuscitative and diagnostic testing.    Candyce Churn III, MD 11/14/13 1345

## 2013-11-13 NOTE — Progress Notes (Addendum)
ANTIBIOTIC CONSULT NOTE - INITIAL  Pharmacy Consult for gentamicin Indication: open fracture  Allergies  Allergen Reactions  . Codeine Nausea And Vomiting    Patient Measurements: Wt= 118.4kg Height: 5\' 8"  (172.7 cm) IBW/kg (Calculated) : 63.9 Adjusted Body Weight: 80kg  Vital Signs: BP: 89/63 mmHg (08/11 1657) Pulse Rate: 134 (08/11 1657) Intake/Output from previous day:   Intake/Output from this shift:    Labs:  Recent Labs  11/13/13 1700  WBC 13.9*  HGB 12.6  PLT 278  CREATININE 0.63   The CrCl is unknown because both a height and weight (above a minimum accepted value) are required for this calculation. No results found for this basename: VANCOTROUGH, VANCOPEAK, VANCORANDOM, GENTTROUGH, GENTPEAK, GENTRANDOM, TOBRATROUGH, TOBRAPEAK, TOBRARND, AMIKACINPEAK, AMIKACINTROU, AMIKACIN,  in the last 72 hours   Microbiology: No results found for this or any previous visit (from the past 720 hour(s)).  Medical History: Past Medical History  Diagnosis Date  . Asthma     with exaerbation and admission in 5/12. no h/ intubation. Is also suspected to have some degree of restrictive pattern and moderate emphysema  (per Dr. Teddy Spikelance's note0 and h/o smoking in past.   . Obesity hypoventilation syndrome     followed with Dr. Stanton KidneyKieth Clance ([facility]) once in 2012.   Marland Kitchen. OSA (obstructive sleep apnea)     on CPAP qhs. Sleep study in 2009 confirmed. Not very compliant with CPAP  . Diastolic CHF     Echo 2009 confirmed (poor acoustic window) , EF 55%,, mild RVH  . Physical deconditioning     wheelchair bound, unable to perforrm ADLs at home  . HLD (hyperlipidemia)     Assessment: 63 yo female with bilateral ankle fracture to begin vancomycin for open fracture. WBC= 13.9, SCr= 0.63 and CrCl ~ 100. Patient also ordered ancef x1 in the ED.   Spoke to Dr. Andrey CampanileWilson and will continue ancef for 24hrs for now with further dosing per Ortho  Goal of Therapy:  Gentamicin dosing based on  [place] nomogram  Plan:  -Gentamicin 560mg  IV x1 -Gentamicin level in 10 hours post dose  -Will follow plans for length of therapy -Ancef 1gm IV q8h for 24 hours (first dose given in ED) and further dosing per Ortho  Harland GermanAndrew Tiffiany Beadles, Pharm D 11/13/2013 6:21 PM

## 2013-11-13 NOTE — ED Notes (Signed)
Pt's heartrate down to 30 Dr. Andrey CampanileWilson at bedside.  Dr. Loretha StaplerWofford in room Atropine 1mg  given

## 2013-11-13 NOTE — ED Notes (Signed)
PT TO ed via [facility] with pt found on floor after reported falling out of wheelchair.  Pt in resp. Arrest on their arrival

## 2013-11-13 NOTE — ED Notes (Addendum)
BP remains soft. IVF input documented. remains lightly sedated, raising L arm. [facility]. BP remains soft. NS IVF liters 4,5 & 6 hanging.

## 2013-11-14 DIAGNOSIS — J9600 Acute respiratory failure, unspecified whether with hypoxia or hypercapnia: Secondary | ICD-10-CM

## 2013-11-14 DIAGNOSIS — G9340 Encephalopathy, unspecified: Secondary | ICD-10-CM

## 2013-11-14 LAB — CBC
HCT: 35.8 % — ABNORMAL LOW (ref 36.0–46.0)
HEMATOCRIT: 40.4 % (ref 36.0–46.0)
Hemoglobin: 10.3 g/dL — ABNORMAL LOW (ref 12.0–15.0)
Hemoglobin: 11.8 g/dL — ABNORMAL LOW (ref 12.0–15.0)
MCH: 30.7 pg (ref 26.0–34.0)
MCH: 31.1 pg (ref 26.0–34.0)
MCHC: 28.8 g/dL — ABNORMAL LOW (ref 30.0–36.0)
MCHC: 29.2 g/dL — ABNORMAL LOW (ref 30.0–36.0)
MCV: 106.5 fL — ABNORMAL HIGH (ref 78.0–100.0)
MCV: 106.6 fL — AB (ref 78.0–100.0)
PLATELETS: 198 10*3/uL (ref 150–400)
Platelets: 225 10*3/uL (ref 150–400)
RBC: 3.36 MIL/uL — ABNORMAL LOW (ref 3.87–5.11)
RBC: 3.79 MIL/uL — AB (ref 3.87–5.11)
RDW: 13.6 % (ref 11.5–15.5)
RDW: 13.8 % (ref 11.5–15.5)
WBC: 14.5 10*3/uL — ABNORMAL HIGH (ref 4.0–10.5)
WBC: 17.2 10*3/uL — ABNORMAL HIGH (ref 4.0–10.5)

## 2013-11-14 LAB — GLUCOSE, CAPILLARY
GLUCOSE-CAPILLARY: 145 mg/dL — AB (ref 70–99)
GLUCOSE-CAPILLARY: 137 mg/dL — AB (ref 70–99)
GLUCOSE-CAPILLARY: 172 mg/dL — AB (ref 70–99)
Glucose-Capillary: 167 mg/dL — ABNORMAL HIGH (ref 70–99)
Glucose-Capillary: 177 mg/dL — ABNORMAL HIGH (ref 70–99)
Glucose-Capillary: 216 mg/dL — ABNORMAL HIGH (ref 70–99)
Glucose-Capillary: 238 mg/dL — ABNORMAL HIGH (ref 70–99)

## 2013-11-14 LAB — BLOOD GAS, ARTERIAL
Acid-Base Excess: 3.2 mmol/L — ABNORMAL HIGH (ref 0.0–2.0)
Bicarbonate: 29.3 meq/L — ABNORMAL HIGH (ref 20.0–24.0)
Drawn by: 41977
FIO2: 0.4 %
MECHVT: 510 mL
O2 Saturation: 97.9 %
PEEP: 5 cmH2O
Patient temperature: 98.6
RATE: 24 {breaths}/min
TCO2: 31.3 mmol/L (ref 0–100)
pCO2 arterial: 63.9 mmHg (ref 35.0–45.0)
pH, Arterial: 7.284 — ABNORMAL LOW (ref 7.350–7.450)
pO2, Arterial: 112 mmHg — ABNORMAL HIGH (ref 80.0–100.0)

## 2013-11-14 LAB — MRSA PCR SCREENING: MRSA BY PCR: NEGATIVE

## 2013-11-14 LAB — PREPARE FRESH FROZEN PLASMA
UNIT DIVISION: 0
Unit division: 0

## 2013-11-14 LAB — CREATININE, SERUM: Creatinine, Ser: 0.69 mg/dL (ref 0.50–1.10)

## 2013-11-14 LAB — BASIC METABOLIC PANEL
Anion gap: 11 (ref 5–15)
BUN: 11 mg/dL (ref 6–23)
CO2: 28 mEq/L (ref 19–32)
Calcium: 7.5 mg/dL — ABNORMAL LOW (ref 8.4–10.5)
Chloride: 107 mEq/L (ref 96–112)
Creatinine, Ser: 0.64 mg/dL (ref 0.50–1.10)
Glucose, Bld: 160 mg/dL — ABNORMAL HIGH (ref 70–99)
POTASSIUM: 4.9 meq/L (ref 3.7–5.3)
Sodium: 146 mEq/L (ref 137–147)

## 2013-11-14 LAB — PROCALCITONIN: Procalcitonin: 1.77 ng/mL

## 2013-11-14 LAB — TROPONIN I

## 2013-11-14 LAB — D-DIMER, QUANTITATIVE (NOT AT ARMC): D DIMER QUANT: 2.86 ug{FEU}/mL — AB (ref 0.00–0.48)

## 2013-11-14 LAB — LACTIC ACID, PLASMA: Lactic Acid, Venous: 4.2 mmol/L — ABNORMAL HIGH (ref 0.5–2.2)

## 2013-11-14 MED ORDER — ENOXAPARIN SODIUM 60 MG/0.6ML ~~LOC~~ SOLN
60.0000 mg | SUBCUTANEOUS | Status: DC
  Administered 2013-11-14 – 2013-12-02 (×19): 60 mg via SUBCUTANEOUS
  Filled 2013-11-14 (×21): qty 0.6

## 2013-11-14 MED ORDER — VITAL HIGH PROTEIN PO LIQD
1000.0000 mL | ORAL | Status: DC
  Administered 2013-11-14 – 2013-11-15 (×3): 1000 mL
  Administered 2013-11-15: 07:00:00
  Administered 2013-11-16 – 2013-11-21 (×7): 1000 mL
  Filled 2013-11-14 (×11): qty 1000

## 2013-11-14 MED ORDER — FAMOTIDINE 40 MG/5ML PO SUSR
20.0000 mg | Freq: Two times a day (BID) | ORAL | Status: DC
  Administered 2013-11-14 – 2013-11-27 (×26): 20 mg
  Filled 2013-11-14 (×28): qty 2.5

## 2013-11-14 MED ORDER — CEFAZOLIN SODIUM 1-5 GM-% IV SOLN
1.0000 g | Freq: Three times a day (TID) | INTRAVENOUS | Status: DC
  Administered 2013-11-14 – 2013-11-15 (×3): 1 g via INTRAVENOUS
  Filled 2013-11-14 (×5): qty 50

## 2013-11-14 MED ORDER — PHENYLEPHRINE HCL 10 MG/ML IJ SOLN
30.0000 ug/min | INTRAVENOUS | Status: DC
  Administered 2013-11-14: 100 ug/min via INTRAVENOUS
  Filled 2013-11-14: qty 1

## 2013-11-14 MED ORDER — PRO-STAT SUGAR FREE PO LIQD
30.0000 mL | Freq: Two times a day (BID) | ORAL | Status: AC
  Administered 2013-11-14: 30 mL
  Administered 2013-11-14: 17:00:00
  Filled 2013-11-14 (×2): qty 30

## 2013-11-14 MED ORDER — SODIUM CHLORIDE 0.9 % IV SOLN
1.0000 mg/h | INTRAVENOUS | Status: DC
  Administered 2013-11-14: 2 mg/h via INTRAVENOUS
  Administered 2013-11-14: 4 mg/h via INTRAVENOUS
  Filled 2013-11-14 (×3): qty 10

## 2013-11-14 MED ORDER — CETYLPYRIDINIUM CHLORIDE 0.05 % MT LIQD
7.0000 mL | Freq: Four times a day (QID) | OROMUCOSAL | Status: DC
  Administered 2013-11-14 – 2013-12-03 (×61): 7 mL via OROMUCOSAL

## 2013-11-14 NOTE — Procedures (Signed)
Arterial Catheter Insertion Procedure Note [name] [id number] 11-14-1950  Procedure: Insertion of Arterial Catheter  Indications: Blood pressure monitoring and Frequent blood sampling  Procedure Details Consent: Unable to obtain consent because of altered level of consciousness. Time Out: Verified patient identification, verified procedure, site/side was marked, verified correct patient position, special equipment/implants available, medications/allergies/relevent history reviewed, required imaging and test results available.  Performed  Maximum sterile technique was used including antiseptics, cap, gloves, gown, hand hygiene, mask and sheet. Skin prep: Chlorhexidine; local anesthetic administered 20 gauge catheter was inserted into left radial artery using the Seldinger technique.  Evaluation Blood flow good; BP tracing good. Complications: No apparent complications.   [name], [name] 11/14/2013

## 2013-11-14 NOTE — Care Management Note (Addendum)
  Page 2 of 2   11/27/2013     2:06:01 PM CARE MANAGEMENT NOTE 11/27/2013  Patient:  [name],Chiann E   Account Number:  [id number]  Date Initiated:  11/14/2013  Documentation initiated by:  Junius CreamerWELL,DEBBIE  Subjective/Objective Assessment:   adm w resp failure, vent     Action/Plan:   lives w fam   Anticipated DC Date:  11/28/2013   Anticipated DC Plan:  SKILLED NURSING FACILITY  In-house referral  Clinical Social Worker      DC Planning Services  CM consult      [facility]  NA   Choice offered to / List presented to:             Status of service:  Completed, signed off Medicare Important Message given?  YES (If response is "NO", the following Medicare IM given date fields will be blank) Date Medicare IM given:  11/27/2013 Medicare IM given by:  Shterna Laramee Date Additional Medicare IM given:   Additional Medicare IM given by:    Discharge Disposition:  SKILLED NURSING FACILITY  Per UR Regulation:  Reviewed for med. necessity/level of care/duration of stay  If discussed at Long Length of Stay Meetings, dates discussed:   11/20/2013  11/22/2013  11/27/2013    Comments:  Aggie Cosierrystal Wynn Alldredge RN, BSN, MSHL, CCM  Nurse - Case Manager,  ([facility])  [phone]  11/27/2013 Patient from home with son/Cameron who is metally delayed but able to provide patient with assistance.  Patient confirms she has been doing fine in the home with her son and has no concerns re: her self care and mgmt. Paient verifies fall event in the home resulting in Bilateral fx ankles.  Patient was in w/c which will not fit into the bathroom.  Patient was on commode when she had event of dizziness and attempted to stand to self asssit back to w/c but was unable to self manage and called for son to assist.  Patient had event of syncope and fell to floor while attempting transfer back to w/c fx both ankles. Patient is agreeable to d/c to SNF d/t no weight bearing status. SW, Lovette Clicheonna Crowder notified of SNF  needs. Home DME:  Home Oxygen CM will continue to monitor for disposition  needs as indicated.

## 2013-11-14 NOTE — Progress Notes (Signed)
  Echocardiogram 2D Echocardiogram has been performed.  Arvil ChacoFoster, Jenalyn Girdner 11/14/2013, 3:13 PM

## 2013-11-14 NOTE — Progress Notes (Signed)
With patient's MMPs and poor skin quality and low demand, bilateral ankle fractures will be treated nonop.  Patient is minimally ambulatory.  NWB BLE in splints.  Recommend lovenox for DVT ppx.  Patient may be a candidate for hindfoot fusion at a later date once the fractures have healed.  F/u in office 2 weeks.     Mayra ReelN. Michael Suhani Stillion, MD [facility] [phone] 11:10 AM

## 2013-11-14 NOTE — Progress Notes (Signed)
dcd c collar per Dr Marchelle Gearingamaswamy

## 2013-11-14 NOTE — Progress Notes (Signed)
Will have my partner, Dr. Lajoyce Cornersuda also evaluate patient's orthopedic injuries.

## 2013-11-14 NOTE — Progress Notes (Signed)
[facility] Result taken by: Marisue IvanLiz, RN  Critical Lab: pCO2 63.9  Time Called: 4:34 AM  Marvia PicklesJames, Elchanan Bob Sara, RN-BC

## 2013-11-14 NOTE — Progress Notes (Signed)
The trauma service will not be involved in the care of this patient.  She has significant primary pulmonary issues that will be addressed by the CCm service.  Marta LamasJames O. Gae BonWyatt, III, MD, FACS [phone] Trauma Surgeon

## 2013-11-14 NOTE — Progress Notes (Signed)
PULMONARY / CRITICAL CARE MEDICINE   Name: [name] MRN: [id number] DOB: [birth year]    ADMISSION DATE:  11/13/2013 CONSULTATION DATE:  11/13/2013  REFERRING MD :  [name], EDP  CHIEF COMPLAINT:  Dyspnea, trauma  INITIAL PRESENTATION:  63 y/o female folllowed by Dr Shelle Iron admitted from the [facility] ED on 8/11 with acute hypercapnic respiratory failure and bilateral lower extremity fractures.  History is obtained by the family because the patient is encephalopathic.  She had been complaining of a cold, cough, sinus congestion and worsening dyspnea for 2-3 days prior to admission.  Her son tried to convince her to come to the ED on 8/11 after she complained of worsening dyspnea and fatigue, but she refused.  Later she went to the bathroom and then started complaining of severe dyspnea.  He called paramedics and his aunt who came to assess.  The patient tried to stand but fell, fracturing both ankles.  Her family noted that her respiratory status worsened and she became unresponsive.  She was also noted to have multiple scattered twitching movements throughout.  In the ED she was not hypoxemic but was essentially unresponsive so she was intubated.   She was found to have a compound R ankle fracture and underwent washout and splinting by orthopedics in the ED.   has a past medical history of Asthma; Obesity hypoventilation syndrome; OSA (obstructive sleep apnea); Diastolic CHF; Physical deconditioning; and HLD (hyperlipidemia).   has past surgical history that includes Cesarean section.   STUDIES:  8/11 CT C/A/P > nonspecific infiltrate LUL, Emphysema, hepatic steatosis, porcelain gallbladder, diverticulosis 8/11 CT head/c-spine> NAICP, but extensive opacification of paranasal sinuses, C spint multifocal osteoarthritic change, no fracture 8/11 bilateral ankle films > R tib-fib fracture, left distal fibula fracture and medial malleolus    SUBJECTIVE:   11/14/13: Normal WUA. Denies tenderness of  c spine. OFf levophed. Going to OR possibly for fracture surgery  VITAL SIGNS: Temp:  [96.8 F (36 C)-99.7 F (37.6 C)] 99.3 F (37.4 C) (08/12 0750) Pulse Rate:  [90-175] 108 (08/12 0800) Resp:  [11-28] 24 (08/12 0800) BP: (60-136)/(40-102) 109/66 mmHg (08/12 0713) SpO2:  [95 %-100 %] 99 % (08/12 0800) Arterial Line BP: (86-166)/(54-79) 109/68 mmHg (08/12 0800) FiO2 (%):  [40 %-100 %] 40 % (08/12 0719) Weight:  [118.4 kg (261 lb 0.4 oz)-130.001 kg (286 lb 9.6 oz)] 129.6 kg (285 lb 11.5 oz) (08/12 0500) HEMODYNAMICS:   VENTILATOR SETTINGS: Vent Mode:  [-] PRVC FiO2 (%):  [40 %-100 %] 40 % Set Rate:  [14 bmp-24 bmp] 24 bmp Vt Set:  [510 mL] 510 mL PEEP:  [5 cmH20] 5 cmH20 Plateau Pressure:  [31 cmH20-36 cmH20] 36 cmH20 INTAKE / OUTPUT:  Intake/Output Summary (Last 24 hours) at 11/14/13 0908 Last data filed at 11/14/13 0800  Gross per 24 hour  Intake 4732.87 ml  Output    835 ml  Net 3897.87 ml    PHYSICAL EXAMINATION: Gen:  Critically ill looking HEENT: C Collar on. No tenderness.  PULM: diminished breath sounds bilaterally, sync with vent CV: Tachy, regular, no mgr Ab: BS+, soft, nontender Ext: warm, bilateral splints in place Derm: diffuse dryness, no clear rash Neuro: Normal WUA on fent LABS: PULMONARY  Recent Labs Lab 11/13/13 1754 11/13/13 2209 11/14/13 0429  PHART 7.291* 7.285* 7.284*  PCO2ART 75.6* 61.7* 63.9*  PO2ART 377.0* 86.2 112.0*  HCO3 36.4* 28.4* 29.3*  TCO2 39 30.3 31.3  O2SAT 100.0 96.4 97.9    CBC  Recent Labs Lab  11/13/13 1700 11/13/13 2358 11/14/13 0442  HGB 12.6 11.8* 10.3*  HCT 44.8 40.4 35.8*  WBC 13.9* 14.5* 17.2*  PLT 278 198 225    COAGULATION  Recent Labs Lab 11/13/13 1700  INR 0.98    CARDIAC   Recent Labs Lab 11/13/13 1653 11/13/13 2358 11/14/13 0321  TROPONINI <0.30 <0.30 <0.30    Recent Labs Lab 11/13/13 1653  PROBNP 215.0*     CHEMISTRY  Recent Labs Lab 11/13/13 1700 11/13/13 2358  11/14/13 0442  NA 140  --  146  K 5.5*  --  4.9  CL 93*  --  107  CO2 37*  --  28  GLUCOSE 300*  --  160*  BUN 10  --  11  CREATININE 0.63 0.69 0.64  CALCIUM 9.3  --  7.5*   Estimated Creatinine Clearance: 101.2 ml/min (by C-G formula based on Cr of 0.64).   LIVER  Recent Labs Lab 11/13/13 1700  AST 41*  ALT 35  ALKPHOS 115  BILITOT <0.2*  PROT 8.4*  ALBUMIN 3.1*  INR 0.98     INFECTIOUS  Recent Labs Lab 11/13/13 2358  LATICACIDVEN 4.2*  PROCALCITON 1.77     ENDOCRINE CBG (last 3)   Recent Labs  11/13/13 2323 11/14/13 0338 11/14/13 0748  GLUCAP 145* 137* 167*         IMAGING x48h Ct Head Wo Contrast  11/13/2013   CLINICAL DATA:  Altered mental status post trauma  EXAM: CT HEAD WITHOUT CONTRAST  CT CERVICAL SPINE WITHOUT CONTRAST  TECHNIQUE: Multidetector CT imaging of the head and cervical spine was performed following the standard protocol without intravenous contrast. Multiplanar CT image reconstructions of the cervical spine were also generated.  COMPARISON:  Head CT February 05, 2007  FINDINGS: CT HEAD FINDINGS  The ventricles are normal in size and configuration. There is no appreciable mass, hemorrhage, extra-axial fluid collection, or midline shift. The gray-white compartments are normal. Bony calvarium appears intact. Mastoid air cells are clear. There is diffuse opacification of both nares. There is bilateral maxillary and ethmoid sinus disease. Note that patient is intubated.  CT CERVICAL SPINE FINDINGS  There is no appreciable fracture or spondylolisthesis. Prevertebral soft tissues and predental space regions are normal. There is moderately severe disc space narrowing at C5-6 and C6-7. There is moderate narrowing at C3-4, C4-5, and C7-T1. There is facet osteoarthritic change to varying degrees at all levels bilaterally. There is no frank disc extrusion or stenosis. Note that there is a nasogastric tube and endotracheal tube present.  IMPRESSION: CT  head: No intracranial mass, hemorrhage, or extra-axial fluid. No acute infarct. Extensive opacification of the nares bilaterally as well as multifocal paranasal sinus disease.  CT cervical spine: Multifocal osteoarthritic change. No fracture or spondylolisthesis.   Electronically Signed   By: Bretta Bang M.D.   On: 11/13/2013 17:53   Ct Chest W Contrast  11/13/2013   CLINICAL DATA:  Fall out of wheelchair. Chest and abdominal injury. Acute respiratory failure. Intubated.  EXAM: CT CHEST, ABDOMEN, AND PELVIS WITH CONTRAST  TECHNIQUE: Multidetector CT imaging of the chest, abdomen and pelvis was performed following the standard protocol during bolus administration of intravenous contrast.  CONTRAST:  OMNIPAQUE IOHEXOL 300 MG/ML  SOLN  COMPARISON:  Chest CT on 06/24/2011  FINDINGS: CT CHEST FINDINGS  Mediastinum/Hilar Regions: No masses or pathologically enlarged lymph nodes identified.  Lungs: Mild emphysema noted. Bibasilar scarring again demonstrated. New mild heterogeneous airspace disease is seen in the medial  left upper lobe, with which is nonspecific and could be due to contusion, aspiration, or pneumonia.  Pleura: No evidence of effusion or mass. No evidence of pneumothorax.  Vascular/Cardiac: No evidence of thoracic aortic injury or mediastinal hematoma.  Musculoskeletal: No suspicious bone lesions identified. No acute fractures identified.  Other: Endotracheal tube and nasogastric tube are seen in appropriate position.  CT ABDOMEN AND PELVIS FINDINGS  Liver: No parenchymal lacerations or masses identified. Diffuse hepatic steatosis again demonstrated.  Gallbladder/Biliary: Mural pattern of calcification is suspicious for porcelain gallbladder. Small gallstones cannot be excluded.  Pancreas: No mass, inflammatory changes, or other parenchymal abnormality identified.  Spleen:  Within normal limits in size and appearance.  Adrenal Glands:  No mass identified.  Kidneys/Urinary Tract: No masses  identified. No evidence of hydronephrosis. Small left renal cysts again noted. Foley catheter seen within the bladder.  Lymph Nodes:  No pathologically enlarged lymph nodes identified.  Bowel: Mild sigmoid diverticulosis again demonstrated. No evidence of diverticulitis or other significant abnormality.  Pelvic/Reproductive Organs: No mass or other significant abnormality identified.  Vascular:  No evidence of abdominal aortic aneurysm.  Musculoskeletal:  No suspicious bone lesions identified.  Other:  None.  IMPRESSION: Mild nonspecific pulmonary infiltrate in medial left upper lobe. Differential diagnosis includes pulmonary contusion, aspiration, or pneumonia. No other traumatic injury identified.  Emphysema.  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 Small calcified gallstones cannot be excluded.  Diverticulosis. No radiographic evidence of diverticulitis.   Electronically Signed   By: Myles Rosenthal M.D.   On: 11/13/2013 18:04   Ct Cervical Spine Wo Contrast  11/13/2013   CLINICAL DATA:  Altered mental status post trauma  EXAM: CT HEAD WITHOUT CONTRAST  CT CERVICAL SPINE WITHOUT CONTRAST  TECHNIQUE: Multidetector CT imaging of the head and cervical spine was performed following the standard protocol without intravenous contrast. Multiplanar CT image reconstructions of the cervical spine were also generated.  COMPARISON:  Head CT February 05, 2007  FINDINGS: CT HEAD FINDINGS  The ventricles are normal in size and configuration. There is no appreciable mass, hemorrhage, extra-axial fluid collection, or midline shift. The gray-white compartments are normal. Bony calvarium appears intact. Mastoid air cells are clear. There is diffuse opacification of both nares. There is bilateral maxillary and ethmoid sinus disease. Note that patient is intubated.  CT CERVICAL SPINE FINDINGS  There is no appreciable fracture or spondylolisthesis. Prevertebral soft tissues and predental space regions are normal.  There is moderately severe disc space narrowing at C5-6 and C6-7. There is moderate narrowing at C3-4, C4-5, and C7-T1. There is facet osteoarthritic change to varying degrees at all levels bilaterally. There is no frank disc extrusion or stenosis. Note that there is a nasogastric tube and endotracheal tube present.  IMPRESSION: CT head: No intracranial mass, hemorrhage, or extra-axial fluid. No acute infarct. Extensive opacification of the nares bilaterally as well as multifocal paranasal sinus disease.  CT cervical spine: Multifocal osteoarthritic change. No fracture or spondylolisthesis.   Electronically Signed   By: Bretta Bang M.D.   On: 11/13/2013 17:53   Ct Abdomen Pelvis W Contrast  11/13/2013   CLINICAL DATA:  Fall out of wheelchair. Chest and abdominal injury. Acute respiratory failure. Intubated.  EXAM: CT CHEST, ABDOMEN, AND PELVIS WITH CONTRAST  TECHNIQUE: Multidetector CT imaging of the chest, abdomen and pelvis was performed following the standard protocol during bolus administration of intravenous contrast.  CONTRAST:  OMNIPAQUE IOHEXOL 300 MG/ML  SOLN  COMPARISON:  Chest CT on 06/24/2011  FINDINGS: CT CHEST FINDINGS  Mediastinum/Hilar Regions: No masses or pathologically enlarged lymph nodes identified.  Lungs: Mild emphysema noted. Bibasilar scarring again demonstrated. New mild heterogeneous airspace disease is seen in the medial left upper lobe, with which is nonspecific and could be due to contusion, aspiration, or pneumonia.  Pleura: No evidence of effusion or mass. No evidence of pneumothorax.  Vascular/Cardiac: No evidence of thoracic aortic injury or mediastinal hematoma.  Musculoskeletal: No suspicious bone lesions identified. No acute fractures identified.  Other: Endotracheal tube and nasogastric tube are seen in appropriate position.  CT ABDOMEN AND PELVIS FINDINGS  Liver: No parenchymal lacerations or masses identified. Diffuse hepatic steatosis again demonstrated.   Gallbladder/Biliary: Mural pattern of calcification is suspicious for porcelain gallbladder. Small gallstones cannot be excluded.  Pancreas: No mass, inflammatory changes, or other parenchymal abnormality identified.  Spleen:  Within normal limits in size and appearance.  Adrenal Glands:  No mass identified.  Kidneys/Urinary Tract: No masses identified. No evidence of hydronephrosis. Small left renal cysts again noted. Foley catheter seen within the bladder.  Lymph Nodes:  No pathologically enlarged lymph nodes identified.  Bowel: Mild sigmoid diverticulosis again demonstrated. No evidence of diverticulitis or other significant abnormality.  Pelvic/Reproductive Organs: No mass or other significant abnormality identified.  Vascular:  No evidence of abdominal aortic aneurysm.  Musculoskeletal:  No suspicious bone lesions identified.  Other:  None.  IMPRESSION: Mild nonspecific pulmonary infiltrate in medial left upper lobe. Differential diagnosis includes pulmonary contusion, aspiration, or pneumonia. No other traumatic injury identified.  Emphysema.  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 Small calcified gallstones cannot be excluded.  Diverticulosis. No radiographic evidence of diverticulitis.   Electronically Signed   By: Myles RosenthalJohn  Stahl M.D.   On: 11/13/2013 18:04   Dg Chest Port 1 View  11/13/2013   CLINICAL DATA:  Hypoxia  EXAM: PORTABLE CHEST - 1 VIEW  COMPARISON:  Chest radiograph and chest CT June 24, 2011  FINDINGS: A portion of the left base is not imaged. Endotracheal tube tip is 4.5 cm above the carina. Nasogastric tube tip and side port are below the diaphragm. There is no appreciable pneumothorax in the regions which are visualized.  There is elevation of the right hemidiaphragm. No edema or consolidation. Heart size and pulmonary vascularity are normal. No adenopathy. No bone lesions are appreciable in visualized regions.  IMPRESSION: Tube positions as described. No pneumothorax  appreciable. Note that a portion of the left lateral base region is not visualized.  No edema or consolidation identified in regions which are visualized.   Electronically Signed   By: Bretta BangWilliam  Woodruff M.D.   On: 11/13/2013 17:26   Dg Tibia/fibula Left Port  11/13/2013   CLINICAL DATA:  Postreduction film.  EXAM: PORTABLE LEFT TIBIA AND FIBULA - 2 VIEW  COMPARISON:  Left ankle radiograph 11/13/2013.  FINDINGS: Four views of the left tibia and fibula demonstrate interval close reduction and splint fixation of previously noted tibial and fibular fractures. Previously noted trimalleolar fracture of the ankle is again noted, however, alignment is significantly improved, with decreased displacement of each of the fractures. The medial malleolar fragment remains approximately 9 mm inferiorly displaced, and the lateral malleolar fragment remains approximately 2 mm posteriorly displace. The posterior malleolar fragment is also approximately 3 mm posteriorly displaced. There is a small amount of gas in the soft tissues anterior to the tibiotalar joint, which could suggest that this is an open fracture. There is also an oblique fracture through the proximal third  of the fibular diaphysis with approximately 4 mm of anterior displacement.  IMPRESSION: 1. Interval closed reduction and splint fixation for trimalleolar fracture of the left ankle with improved alignment, as above. Small locule of gas in the soft tissues anterior to the tibiotalar joint may suggest that this is an open fracture. 2. There is also a minimally displaced oblique fracture through the proximal third of the fibular diaphysis, as above.   Electronically Signed   By: Trudie Reed M.D.   On: 11/13/2013 21:12   Dg Tibia/fibula Right Port  11/13/2013   CLINICAL DATA:  Postreduction ankle region fracture  EXAM: PORTABLE RIGHT TIBIA AND FIBULA - 2 VIEW  COMPARISON:  Right ankle November 13, 2013  FINDINGS: Frontal and lateral views were obtained with  overlying immobilization device. The comminuted fracture of the distal fibula is again noted with major fracture fragments in overall near anatomic alignment. There is a fracture of the medial malleolus with alignment near anatomic in this area. Fracture of the posterior tibia is again noted with fracture fragments in near anatomic alignment. No new or apparent more proximal fractures. There appears to be reduction of the ankle mortise disruption.  IMPRESSION: Major fracture fragments in the ankle region or in near anatomic alignment. Previous ankle mortise disruption appears to have been reduced. No more proximal fractures.   Electronically Signed   By: Bretta Bang M.D.   On: 11/13/2013 21:12   Dg Ankle Left Port  11/13/2013   CLINICAL DATA:  Fall.  Ankle injury and pain.  EXAM: PORTABLE LEFT ANKLE - 1 VIEW  COMPARISON:  None.  FINDINGS: Only a single oblique view was obtained portably before the patient was taken to the catheterization lab. The shows fractures of the distal fibula and medial malleolus of the distal tibia. There is anterior and lateral subluxation of the talus with respect to the ankle mortise.  IMPRESSION: Limited one view exam shows fractures of the distal fibula and medial malleolus, with anterior and lateral subluxation of the talus.   Electronically Signed   By: Myles Rosenthal M.D.   On: 11/13/2013 18:17   Dg Ankle Right Port  11/13/2013   CLINICAL DATA:  Found unresponsive.  Obvious deformity.  EXAM: PORTABLE RIGHT ANKLE - 2 VIEW  COMPARISON:  04/23/2007  FINDINGS: Portable AP and lateral views. Comminuted distal fibular fracture. Comminuted medial and posterior tibial fractures. Widening of the ankle mortise with relative posterior and lateral subluxation of the talar dome relative to the distal tibia. Base of fifth metatarsal intact. Overlying soft tissue swelling.  IMPRESSION: Comminuted fracture subluxation involving the distal tibia and fibula. Suboptimally evaluated on these  portable radiographs. Consider further evaluation with CT.   Electronically Signed   By: Jeronimo Greaves M.D.   On: 11/13/2013 18:32       ASSESSMENT / PLAN:  PULMONARY OETT 8/11 >> A: Acute hypercapnic respiratory failure most likely due to acute exacerbation of COPD; ddx includes PE but this is less likely given history of worsening dyspnea with a cold for several days    - improving but still  In resp acidosis  P:   Full vent support Scheduled nebs increased to q4h Scheduled solumedrol CXR/ABG/SBT/WUA in AM D-dimer is  positive -> check LE doppler   CARDIOVASCULAR CVL none A: Sinus tachycardia Diastolic CHF not in exacerbation  - no evidence of AE-cHF (diastolic)  P:  Echo Tele Cycle cardiac enzymes  RENAL A:  No acute issues P:   Monitor UOP/BMET  GASTROINTESTINAL A:  No acute issues P:   OG tube Tube feedings Pepcid for stress ulcer prophylaxis  HEMATOLOGIC A:  No acute issues P:  Monitor for bleeding Lovenox for DVT prophylaxis  INFECTIOUS A:  AE COPD Compound R tib-fib fracture P:   Sputum culture 8/11 >> resp virus panel 8/12>> Abx: Ceftriaxone 8/11,  -> change to ancef due to fracture/or needs 11/14/13 >>  ENDOCRINE A:  Hyperglycemia, no history of DM2 P:   ICU hyperglycemia protocol  NEUROLOGIC A:  Acute encephalopathy> sounds classic for hypercapnea   - resolved and no c spine tenderness. Neuro intact P:   RASS goal: -1 Fentanyl gtt to prn Daily WUA  Ortho A: Bilateral LE fractures P:  OK for OR 8/12 (note patient on lovenox proph)  TODAY'S SUMMARY: Acute hypercapnic respiratory failure due to AE COPD leading to a fall with bilateral lower extremity fractures. Plan full vent support, treatment of AE COPD, to OR as soon as 8/12 if condition remains stable. No family at bedside 11/14/2013   The patient is critically ill with multiple organ systems failure and requires high complexity decision making for assessment and support,  frequent evaluation and titration of therapies, application of advanced monitoring technologies and extensive interpretation of multiple databases.   Critical Care Time devoted to patient care services described in this note is  35  Minutes.  Dr. Kalman Shan, M.D., [facility].C.P Pulmonary and Critical Care Medicine Staff Physician [facility] Pager: [phone], If no answer or between  15:00h - 7:00h: call [phone]  11/14/2013 9:30 AM

## 2013-11-14 NOTE — Progress Notes (Addendum)
INITIAL NUTRITION ASSESSMENT  DOCUMENTATION CODES Per approved criteria  -Morbid Obesity   INTERVENTION:  Utilize 9M PEPuP Protocol: initiate TF via OGT with Vital High Protein at 25 ml/h and Prostat 30 ml BID on day 1; on day 2, discontinue Prostat and increase to goal rate of 65 ml/h (1560 ml per day) to provide 1560 kcals (70% of estimated needs and 24.5 kcals/kg ideal weight), 137 gm protein, 1304 ml free water daily.   NUTRITION DIAGNOSIS: Inadequate oral intake related to inability to eat as evidenced by NPO status.   Goal: Enteral nutrition to provide 60-70% of estimated calorie needs (22-25 kcals/kg ideal body weight) and 100% of estimated protein needs, based on ASPEN guidelines for permissive underfeeding in critically ill obese individuals  Monitor:  TF tolerance/adequacy, weight trend, labs, vent status.  Reason for Assessment: MD Consult for TF initiation and management.  63 y.o. female  Admitting Dx: Acute respiratory failure  ASSESSMENT: 63 y/o female with severe COPD and obesity hypoventilation syndrome [facility] She had severe hypercapnic respiratory failure requiring mechanical ventilation. Just prior to admission she fell breaking both ankles.   Discussed patient in ICU rounds today. RD to enter TF orders. Nutrition focused physical exam completed.  No muscle or subcutaneous fat depletion noticed.  Patient is currently intubated on ventilator support MV: 11.8 L/min Temp (24hrs), Avg:98.7 F (37.1 C), Min:96.8 F (36 C), Max:99.7 F (37.6 C)  Propofol: none  Height: Ht Readings from Last 1 Encounters:  11/13/13 5\' 8"  (1.727 m)    Weight: Wt Readings from Last 1 Encounters:  11/14/13 285 lb 11.5 oz (129.6 kg)    Ideal Body Weight: 63.6 kg  % Ideal Body Weight: 204%  Wt Readings from Last 10 Encounters:  11/14/13 285 lb 11.5 oz (129.6 kg)  06/28/11 261 lb 0.4 oz (118.4 kg)  04/22/10 261 lb 6.4 oz (118.57 kg)   04/30/09 253 lb 1.6 oz (114.805 kg)  04/17/08 263 lb (119.296 kg)  03/13/08 267 lb 5 oz (121.252 kg)  01/17/08 265 lb (120.203 kg)  09/08/07 263 lb (119.296 kg)  06/15/07 252 lb (114.306 kg)  05/18/07 251 lb (113.853 kg)    Usual Body Weight: 261 lb 2 years ago  % Usual Body Weight: 109%  BMI:  Body mass index is 43.45 kg/(m^2). class 3, extreme/morbid obesity  Estimated Nutritional Needs: Kcal: 2214 Protein: 127-159 gm Fluid: 2.2 L  Skin: WDL  Diet Order:  NPO  EDUCATION NEEDS: -No education needs identified at this time   Intake/Output Summary (Last 24 hours) at 11/14/13 1442 Last data filed at 11/14/13 1300  Gross per 24 hour  Intake 4838.87 ml  Output    835 ml  Net 4003.87 ml    Last BM: None documented since admission    Labs:   Recent Labs Lab 11/13/13 1700 11/13/13 2358 11/14/13 0442  NA 140  --  146  K 5.5*  --  4.9  CL 93*  --  107  CO2 37*  --  28  BUN 10  --  11  CREATININE 0.63 0.69 0.64  CALCIUM 9.3  --  7.5*  GLUCOSE 300*  --  160*    CBG (last 3)   Recent Labs  11/14/13 0338 11/14/13 0748 11/14/13 1316  GLUCAP 137* 167* 172*    Scheduled Meds: . antiseptic oral rinse  7 mL Mouth Rinse QID  .  ceFAZolin (ANCEF) IV  1 g Intravenous 3 times per  day  . chlorhexidine  15 mL Mouth Rinse BID  . enoxaparin (LOVENOX) injection  60 mg Subcutaneous Q24H  . famotidine  20 mg Per Tube Q12H  . insulin aspart  2-6 Units Subcutaneous 6 times per day  . ipratropium-albuterol  3 mL Nebulization Q6H  . methylPREDNISolone (SOLU-MEDROL) injection  60 mg Intravenous Q12H    Continuous Infusions: . sodium chloride 20 mL/hr at 11/13/13 2100  . fentaNYL infusion INTRAVENOUS 100 mcg/hr (11/14/13 1300)  . midazolam (VERSED) infusion 3 mg/hr (11/14/13 1300)  . phenylephrine (NEO-SYNEPHRINE) Adult infusion Stopped (11/14/13 1610)    Past Medical History  Diagnosis Date  . Asthma     with exaerbation and admission in 5/12. no h/ intubation.  Is also suspected to have some degree of restrictive pattern and moderate emphysema  (per Dr. Teddy Spike note0 and h/o smoking in past.   . Obesity hypoventilation syndrome     followed with Dr. Stanton Kidney ([facility]) once in 2012.   Marland Kitchen OSA (obstructive sleep apnea)     on CPAP qhs. Sleep study in 2009 confirmed. Not very compliant with CPAP  . Diastolic CHF     Echo 2009 confirmed (poor acoustic window) , EF 55%,, mild RVH  . Physical deconditioning     wheelchair bound, unable to perforrm ADLs at home  . HLD (hyperlipidemia)     Past Surgical History  Procedure Laterality Date  . Cesarean section      Joaquin Courts, RD, LDN, CNSC Pager [phone] After Hours Pager [phone]

## 2013-11-15 ENCOUNTER — Inpatient Hospital Stay (HOSPITAL_COMMUNITY): Payer: Medicaid Other

## 2013-11-15 DIAGNOSIS — R609 Edema, unspecified: Secondary | ICD-10-CM

## 2013-11-15 LAB — CBC WITH DIFFERENTIAL/PLATELET
Basophils Absolute: 0 10*3/uL (ref 0.0–0.1)
Basophils Relative: 0 % (ref 0–1)
EOS ABS: 0 10*3/uL (ref 0.0–0.7)
Eosinophils Relative: 0 % (ref 0–5)
HCT: 35.7 % — ABNORMAL LOW (ref 36.0–46.0)
HEMOGLOBIN: 10.3 g/dL — AB (ref 12.0–15.0)
LYMPHS ABS: 1.3 10*3/uL (ref 0.7–4.0)
LYMPHS PCT: 14 % (ref 12–46)
MCH: 30.9 pg (ref 26.0–34.0)
MCHC: 28.9 g/dL — ABNORMAL LOW (ref 30.0–36.0)
MCV: 107.2 fL — ABNORMAL HIGH (ref 78.0–100.0)
MONOS PCT: 11 % (ref 3–12)
Monocytes Absolute: 1 10*3/uL (ref 0.1–1.0)
Neutro Abs: 6.5 10*3/uL (ref 1.7–7.7)
Neutrophils Relative %: 75 % (ref 43–77)
PLATELETS: 229 10*3/uL (ref 150–400)
RBC: 3.33 MIL/uL — AB (ref 3.87–5.11)
RDW: 14.3 % (ref 11.5–15.5)
WBC: 8.8 10*3/uL (ref 4.0–10.5)

## 2013-11-15 LAB — BASIC METABOLIC PANEL
ANION GAP: 11 (ref 5–15)
BUN: 20 mg/dL (ref 6–23)
CHLORIDE: 105 meq/L (ref 96–112)
CO2: 29 meq/L (ref 19–32)
CREATININE: 0.68 mg/dL (ref 0.50–1.10)
Calcium: 8 mg/dL — ABNORMAL LOW (ref 8.4–10.5)
GFR calc Af Amer: >90 mL/min (ref >90)
GFR calc non Af Amer: >90 mL/min (ref >90)
Glucose, Bld: 230 mg/dL — ABNORMAL HIGH (ref 70–99)
POTASSIUM: 4.6 meq/L (ref 3.7–5.3)
Sodium: 145 mEq/L (ref 137–147)

## 2013-11-15 LAB — GLUCOSE, CAPILLARY
GLUCOSE-CAPILLARY: 198 mg/dL — AB (ref 70–99)
Glucose-Capillary: 210 mg/dL — ABNORMAL HIGH (ref 70–99)
Glucose-Capillary: 206 mg/dL — ABNORMAL HIGH (ref 70–99)
Glucose-Capillary: 264 mg/dL — ABNORMAL HIGH (ref 70–99)
Glucose-Capillary: 229 mg/dL — ABNORMAL HIGH (ref 70–99)

## 2013-11-15 LAB — MAGNESIUM: MAGNESIUM: 2 mg/dL (ref 1.5–2.5)

## 2013-11-15 LAB — LACTIC ACID, PLASMA: LACTIC ACID, VENOUS: 2.6 mmol/L — AB (ref 0.5–2.2)

## 2013-11-15 LAB — RESPIRATORY VIRUS PANEL
ADENOVIRUS: NOT DETECTED
INFLUENZA A H1: NOT DETECTED
INFLUENZA A: NOT DETECTED
Influenza A H3: NOT DETECTED
Influenza B: NOT DETECTED
Metapneumovirus: NOT DETECTED
PARAINFLUENZA 2 A: NOT DETECTED
Parainfluenza 1: NOT DETECTED
Parainfluenza 3: NOT DETECTED
RESPIRATORY SYNCYTIAL VIRUS A: NOT DETECTED
RESPIRATORY SYNCYTIAL VIRUS B: DETECTED — AB
Rhinovirus: NOT DETECTED

## 2013-11-15 LAB — BLOOD GAS, ARTERIAL
Acid-Base Excess: 3 mmol/L — ABNORMAL HIGH (ref 0.0–2.0)
Bicarbonate: 28.8 mEq/L — ABNORMAL HIGH (ref 20.0–24.0)
Drawn by: 41977
FIO2: 0.4 %
MECHVT: 510 mL
O2 Saturation: 98.2 %
PATIENT TEMPERATURE: 98.6
PEEP: 5 cmH2O
RATE: 24 resp/min
TCO2: 30.7 mmol/L (ref 0–100)
pCO2 arterial: 60.3 mmHg (ref 35.0–45.0)
pH, Arterial: 7.301 — ABNORMAL LOW (ref 7.350–7.450)
pO2, Arterial: 115 mmHg — ABNORMAL HIGH (ref 80.0–100.0)

## 2013-11-15 LAB — PHOSPHORUS: PHOSPHORUS: 1.4 mg/dL — AB (ref 2.3–4.6)

## 2013-11-15 LAB — BLOOD PRODUCT ORDER (VERBAL) VERIFICATION

## 2013-11-15 MED ORDER — WHITE PETROLATUM GEL
Status: AC
  Administered 2013-11-15: 10:00:00
  Filled 2013-11-15: qty 5

## 2013-11-15 MED ORDER — POTASSIUM PHOSPHATES 15 MMOLE/5ML IV SOLN
20.0000 mmol | Freq: Once | INTRAVENOUS | Status: AC
  Administered 2013-11-15: 20 mmol via INTRAVENOUS
  Filled 2013-11-15: qty 6.67

## 2013-11-15 MED ORDER — LEVOFLOXACIN IN D5W 500 MG/100ML IV SOLN
500.0000 mg | INTRAVENOUS | Status: AC
  Administered 2013-11-15 – 2013-11-17 (×3): 500 mg via INTRAVENOUS
  Filled 2013-11-15 (×3): qty 100

## 2013-11-15 MED ORDER — INSULIN ASPART 100 UNIT/ML ~~LOC~~ SOLN
0.0000 [IU] | SUBCUTANEOUS | Status: DC
  Administered 2013-11-15: 11 [IU] via SUBCUTANEOUS
  Administered 2013-11-16 (×2): 7 [IU] via SUBCUTANEOUS
  Administered 2013-11-16: 4 [IU] via SUBCUTANEOUS
  Administered 2013-11-16: 7 [IU] via SUBCUTANEOUS
  Administered 2013-11-16: 4 [IU] via SUBCUTANEOUS
  Administered 2013-11-16 – 2013-11-17 (×2): 7 [IU] via SUBCUTANEOUS
  Administered 2013-11-17: 4 [IU] via SUBCUTANEOUS
  Administered 2013-11-17 (×3): 7 [IU] via SUBCUTANEOUS
  Administered 2013-11-17: 11 [IU] via SUBCUTANEOUS
  Administered 2013-11-17: 7 [IU] via SUBCUTANEOUS
  Administered 2013-11-18: 3 [IU] via SUBCUTANEOUS
  Administered 2013-11-18: 7 [IU] via SUBCUTANEOUS
  Administered 2013-11-18 (×2): 4 [IU] via SUBCUTANEOUS
  Administered 2013-11-18: 7 [IU] via SUBCUTANEOUS
  Administered 2013-11-18: 4 [IU] via SUBCUTANEOUS
  Administered 2013-11-19: 7 [IU] via SUBCUTANEOUS
  Administered 2013-11-19 (×2): 4 [IU] via SUBCUTANEOUS
  Administered 2013-11-19: 3 [IU] via SUBCUTANEOUS
  Administered 2013-11-20: 11 [IU] via SUBCUTANEOUS
  Administered 2013-11-20: 3 [IU] via SUBCUTANEOUS
  Administered 2013-11-20: 7 [IU] via SUBCUTANEOUS
  Administered 2013-11-20: 4 [IU] via SUBCUTANEOUS
  Administered 2013-11-20: 7 [IU] via SUBCUTANEOUS
  Administered 2013-11-21: 4 [IU] via SUBCUTANEOUS
  Administered 2013-11-21: 11 [IU] via SUBCUTANEOUS
  Administered 2013-11-21: 3 [IU] via SUBCUTANEOUS
  Administered 2013-11-21: 4 [IU] via SUBCUTANEOUS
  Administered 2013-11-21: 7 [IU] via SUBCUTANEOUS
  Administered 2013-11-21 – 2013-11-22 (×4): 4 [IU] via SUBCUTANEOUS
  Administered 2013-11-23: 3 [IU] via SUBCUTANEOUS
  Administered 2013-11-23 – 2013-11-24 (×3): 4 [IU] via SUBCUTANEOUS
  Administered 2013-11-24: 3 [IU] via SUBCUTANEOUS
  Administered 2013-11-24 (×2): 4 [IU] via SUBCUTANEOUS

## 2013-11-15 MED ORDER — IPRATROPIUM-ALBUTEROL 0.5-2.5 (3) MG/3ML IN SOLN
3.0000 mL | RESPIRATORY_TRACT | Status: DC
  Administered 2013-11-15 – 2013-11-17 (×14): 3 mL via RESPIRATORY_TRACT
  Filled 2013-11-15 (×14): qty 3

## 2013-11-15 NOTE — Progress Notes (Signed)
Attempted to wean pt on PSV.  Pt immed desat to 86-87%, RN aware.  Pt placed back on full vent support.

## 2013-11-15 NOTE — Progress Notes (Addendum)
Inpatient Diabetes Program Recommendations  AACE/ADA: New Consensus Statement on Inpatient Glycemic Control (2013)  Target Ranges:  Prepandial:   less than 140 mg/dL      Peak postprandial:   less than 180 mg/dL (1-2 hours)      Critically ill patients:  140 - 180 mg/dL   Results for [name], Brandy E (MRN [id number]) as of 11/15/2013 09:53  Ref. Range 11/14/2013 07:48 11/14/2013 13:16 11/14/2013 15:10 11/14/2013 18:51 11/14/2013 23:37 11/15/2013 04:28 11/15/2013 08:25  Glucose-Capillary Latest Range: 70-99 mg/dL 782167 (H) 956172 (H) 213177 (H) 216 (H) 238 (H) 210 (H) 198 (H)   Diabetes history: No Outpatient Diabetes medications: NA Current orders for Inpatient glycemic control: Novolog 2-6 units Q4H  Inpatient Diabetes Program Recommendations Insulin - Basal: Please consider ordering low dose basal insulin; recommend starting with Levemir 10 units Q24H. Insulin - Meal Coverage: If basal insulin is started and CBGs continue to be greater than 180 mg/dl, may want to consider ordering Novolog 3 units Q4H for tube feeding coverage.  NURSING: Please be aware of transition phase parameters for ICU Hyperglycemia Protocol. According to the protocol, patient should have been transitioned from Phase 1 to Phase 2 around midnight when patient had 2 subsequent CBG > 200 mg/dL.   Thanks, Orlando PennerMarie Rafe Mackowski, RN, MSN, CCRN Diabetes Coordinator Inpatient Diabetes Program [phone] (Team Pager) [phone] ([facility] office) [phone] [facility] office)

## 2013-11-15 NOTE — Progress Notes (Signed)
PULMONARY / CRITICAL CARE MEDICINE   Name: [name] MRN: [id number] DOB: 1951-02-21    ADMISSION DATE:  11/13/2013 CONSULTATION DATE:  11/13/2013  REFERRING MD :  [name], EDP  CHIEF COMPLAINT:  Dyspnea, trauma  INITIAL PRESENTATION:  63 y/o female folllowed by Dr Shelle Iron admitted from the [facility] ED on 8/11 with acute hypercapnic respiratory failure and bilateral lower extremity fractures.  History is obtained by the family because the patient is encephalopathic.  She had been complaining of a cold, cough, sinus congestion and worsening dyspnea for 2-3 days prior to admission.  Her son tried to convince her to come to the ED on 8/11 after she complained of worsening dyspnea and fatigue, but she refused.  Later she went to the bathroom and then started complaining of severe dyspnea.  He called paramedics and his aunt who came to assess.  The patient tried to stand but fell, fracturing both ankles.  Her family noted that her respiratory status worsened and she became unresponsive.  She was also noted to have multiple scattered twitching movements throughout.  In the ED she was not hypoxemic but was essentially unresponsive so she was intubated.   She was found to have a compound R ankle fracture and underwent washout and splinting by orthopedics in the ED.   has a past medical history of Asthma; Obesity hypoventilation syndrome; OSA (obstructive sleep apnea); Diastolic CHF; Physical deconditioning; and HLD (hyperlipidemia).   has past surgical history that includes Cesarean section.   EVENTS/STUDIES 8/11 CT C/A/P > nonspecific infiltrate LUL, Emphysema, hepatic steatosis, porcelain gallbladder, diverticulosis 8/11 CT head/c-spine> NAICP, but extensive opacification of paranasal sinuses, C spint multifocal osteoarthritic change, no fracture 8/11 bilateral ankle films > R tib-fib fracture, left distal fibula fracture and medial malleolus 11/14/13: Normal WUA. Denies tenderness of c spine. OFf  levophed. Going to OR possibly for fracture surgery   SUBJECTIVE/OVERNIGHT/INTERVAL HX 11/15/13: Failed SBT but was on sedation at this time. Ortho prefer conservative approach due to risk status. Echo 8/12 with mixture of chronic systolic and diastolic dysfn (ef 45% with grade 1 diast dysfn) .  VITAL SIGNS: Temp:  [98.4 F (36.9 C)-100.1 F (37.8 C)] 100.1 F (37.8 C) (08/13 0429) Pulse Rate:  [98-122] 108 (08/13 0804) Resp:  [21-27] 24 (08/13 0804) BP: (92-115)/(61-82) 97/76 mmHg (08/13 0630) SpO2:  [93 %-100 %] 97 % (08/13 0804) Arterial Line BP: (74-125)/(60-79) 74/61 mmHg (08/13 0400) FiO2 (%):  [30 %-40 %] 30 % (08/13 0804) Weight:  [129.2 kg (284 lb 13.4 oz)] 129.2 kg (284 lb 13.4 oz) (08/13 0615) HEMODYNAMICS:   VENTILATOR SETTINGS: Vent Mode:  [-] PRVC FiO2 (%):  [30 %-40 %] 30 % Set Rate:  [24 bmp] 24 bmp Vt Set:  [510 mL] 510 mL PEEP:  [5 cmH20] 5 cmH20 Plateau Pressure:  [29 cmH20-31 cmH20] 29 cmH20 INTAKE / OUTPUT:  Intake/Output Summary (Last 24 hours) at 11/15/13 0817 Last data filed at 11/15/13 7829  Gross per 24 hour  Intake 1078.42 ml  Output    740 ml  Net 338.42 ml    PHYSICAL EXAMINATION: Gen:  Critically ill looking HEENT: C Collar on. No tenderness.  PULM: diminished breath sounds bilaterally, sync with vent CV: Tachy, regular, no mgr Ab: BS+, soft, nontender Ext: warm, bilateral splints in place Derm: diffuse dryness, no clear rash Neuro: Normal WUA on fent LABS: PULMONARY  Recent Labs Lab 11/13/13 1754 11/13/13 2209 11/14/13 0429 11/15/13 0449  PHART 7.291* 7.285* 7.284* 7.301*  PCO2ART 75.6*  61.7* 63.9* 60.3*  PO2ART 377.0* 86.2 112.0* 115.0*  HCO3 36.4* 28.4* 29.3* 28.8*  TCO2 39 30.3 31.3 30.7  O2SAT 100.0 96.4 97.9 98.2    CBC  Recent Labs Lab 11/13/13 2358 11/14/13 0442 11/15/13 0523  HGB 11.8* 10.3* 10.3*  HCT 40.4 35.8* 35.7*  WBC 14.5* 17.2* 8.8  PLT 198 225 229    COAGULATION  Recent Labs Lab  11/13/13 1700  INR 0.98    CARDIAC    Recent Labs Lab 11/13/13 1653 11/13/13 2358 11/14/13 0321  TROPONINI <0.30 <0.30 <0.30    Recent Labs Lab 11/13/13 1653  PROBNP 215.0*     CHEMISTRY  Recent Labs Lab 11/13/13 1700 11/13/13 2358 11/14/13 0442 11/15/13 0523  NA 140  --  146 145  K 5.5*  --  4.9 4.6  CL 93*  --  107 105  CO2 37*  --  28 29  GLUCOSE 300*  --  160* 230*  BUN 10  --  11 20  CREATININE 0.63 0.69 0.64 0.68  CALCIUM 9.3  --  7.5* 8.0*  MG  --   --   --  2.0  PHOS  --   --   --  1.4*   Estimated Creatinine Clearance: 100.9 ml/min (by C-G formula based on Cr of 0.68).   LIVER  Recent Labs Lab 11/13/13 1700  AST 41*  ALT 35  ALKPHOS 115  BILITOT <0.2*  PROT 8.4*  ALBUMIN 3.1*  INR 0.98     INFECTIOUS  Recent Labs Lab 11/13/13 2358 11/15/13 0523  LATICACIDVEN 4.2* 2.6*  PROCALCITON 1.77  --      ENDOCRINE CBG (last 3)   Recent Labs  11/14/13 1851 11/14/13 2337 11/15/13 0428  GLUCAP 216* 238* 210*         IMAGING x48h Ct Head Wo Contrast  11/13/2013   CLINICAL DATA:  Altered mental status post trauma  EXAM: CT HEAD WITHOUT CONTRAST  CT CERVICAL SPINE WITHOUT CONTRAST  TECHNIQUE: Multidetector CT imaging of the head and cervical spine was performed following the standard protocol without intravenous contrast. Multiplanar CT image reconstructions of the cervical spine were also generated.  COMPARISON:  Head CT February 05, 2007  FINDINGS: CT HEAD FINDINGS  The ventricles are normal in size and configuration. There is no appreciable mass, hemorrhage, extra-axial fluid collection, or midline shift. The gray-white compartments are normal. Bony calvarium appears intact. Mastoid air cells are clear. There is diffuse opacification of both nares. There is bilateral maxillary and ethmoid sinus disease. Note that patient is intubated.  CT CERVICAL SPINE FINDINGS  There is no appreciable fracture or spondylolisthesis. Prevertebral  soft tissues and predental space regions are normal. There is moderately severe disc space narrowing at C5-6 and C6-7. There is moderate narrowing at C3-4, C4-5, and C7-T1. There is facet osteoarthritic change to varying degrees at all levels bilaterally. There is no frank disc extrusion or stenosis. Note that there is a nasogastric tube and endotracheal tube present.  IMPRESSION: CT head: No intracranial mass, hemorrhage, or extra-axial fluid. No acute infarct. Extensive opacification of the nares bilaterally as well as multifocal paranasal sinus disease.  CT cervical spine: Multifocal osteoarthritic change. No fracture or spondylolisthesis.   Electronically Signed   By: Bretta Bang M.D.   On: 11/13/2013 17:53   Ct Chest W Contrast  11/13/2013   CLINICAL DATA:  Fall out of wheelchair. Chest and abdominal injury. Acute respiratory failure. Intubated.  EXAM: CT CHEST, ABDOMEN, AND PELVIS WITH  CONTRAST  TECHNIQUE: Multidetector CT imaging of the chest, abdomen and pelvis was performed following the standard protocol during bolus administration of intravenous contrast.  CONTRAST:  OMNIPAQUE IOHEXOL 300 MG/ML  SOLN  COMPARISON:  Chest CT on 06/24/2011  FINDINGS: CT CHEST FINDINGS  Mediastinum/Hilar Regions: No masses or pathologically enlarged lymph nodes identified.  Lungs: Mild emphysema noted. Bibasilar scarring again demonstrated. New mild heterogeneous airspace disease is seen in the medial left upper lobe, with which is nonspecific and could be due to contusion, aspiration, or pneumonia.  Pleura: No evidence of effusion or mass. No evidence of pneumothorax.  Vascular/Cardiac: No evidence of thoracic aortic injury or mediastinal hematoma.  Musculoskeletal: No suspicious bone lesions identified. No acute fractures identified.  Other: Endotracheal tube and nasogastric tube are seen in appropriate position.  CT ABDOMEN AND PELVIS FINDINGS  Liver: No parenchymal lacerations or masses identified. Diffuse  hepatic steatosis again demonstrated.  Gallbladder/Biliary: Mural pattern of calcification is suspicious for porcelain gallbladder. Small gallstones cannot be excluded.  Pancreas: No mass, inflammatory changes, or other parenchymal abnormality identified.  Spleen:  Within normal limits in size and appearance.  Adrenal Glands:  No mass identified.  Kidneys/Urinary Tract: No masses identified. No evidence of hydronephrosis. Small left renal cysts again noted. Foley catheter seen within the bladder.  Lymph Nodes:  No pathologically enlarged lymph nodes identified.  Bowel: Mild sigmoid diverticulosis again demonstrated. No evidence of diverticulitis or other significant abnormality.  Pelvic/Reproductive Organs: No mass or other significant abnormality identified.  Vascular:  No evidence of abdominal aortic aneurysm.  Musculoskeletal:  No suspicious bone lesions identified.  Other:  None.  IMPRESSION: Mild nonspecific pulmonary infiltrate in medial left upper lobe. Differential diagnosis includes pulmonary contusion, aspiration, or pneumonia. No other traumatic injury identified.  Emphysema.  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 Small calcified gallstones cannot be excluded.  Diverticulosis. No radiographic evidence of diverticulitis.   Electronically Signed   By: Myles Rosenthal M.D.   On: 11/13/2013 18:04   Ct Cervical Spine Wo Contrast  11/13/2013   CLINICAL DATA:  Altered mental status post trauma  EXAM: CT HEAD WITHOUT CONTRAST  CT CERVICAL SPINE WITHOUT CONTRAST  TECHNIQUE: Multidetector CT imaging of the head and cervical spine was performed following the standard protocol without intravenous contrast. Multiplanar CT image reconstructions of the cervical spine were also generated.  COMPARISON:  Head CT February 05, 2007  FINDINGS: CT HEAD FINDINGS  The ventricles are normal in size and configuration. There is no appreciable mass, hemorrhage, extra-axial fluid collection, or midline shift.  The gray-white compartments are normal. Bony calvarium appears intact. Mastoid air cells are clear. There is diffuse opacification of both nares. There is bilateral maxillary and ethmoid sinus disease. Note that patient is intubated.  CT CERVICAL SPINE FINDINGS  There is no appreciable fracture or spondylolisthesis. Prevertebral soft tissues and predental space regions are normal. There is moderately severe disc space narrowing at C5-6 and C6-7. There is moderate narrowing at C3-4, C4-5, and C7-T1. There is facet osteoarthritic change to varying degrees at all levels bilaterally. There is no frank disc extrusion or stenosis. Note that there is a nasogastric tube and endotracheal tube present.  IMPRESSION: CT head: No intracranial mass, hemorrhage, or extra-axial fluid. No acute infarct. Extensive opacification of the nares bilaterally as well as multifocal paranasal sinus disease.  CT cervical spine: Multifocal osteoarthritic change. No fracture or spondylolisthesis.   Electronically Signed   By: Bretta Bang M.D.   On: 11/13/2013  17:53   Ct Abdomen Pelvis W Contrast  11/13/2013   CLINICAL DATA:  Fall out of wheelchair. Chest and abdominal injury. Acute respiratory failure. Intubated.  EXAM: CT CHEST, ABDOMEN, AND PELVIS WITH CONTRAST  TECHNIQUE: Multidetector CT imaging of the chest, abdomen and pelvis was performed following the standard protocol during bolus administration of intravenous contrast.  CONTRAST:  100mL OMNIPAQUE IOHEXOL 300 MG/ML  SOLN  COMPARISON:  Chest CT on 06/24/2011  FINDINGS: CT CHEST FINDINGS  Mediastinum/Hilar Regions: No masses or pathologically enlarged lymph nodes identified.  Lungs: Mild emphysema noted. Bibasilar scarring again demonstrated. New mild heterogeneous airspace disease is seen in the medial left upper lobe, with which is nonspecific and could be due to contusion, aspiration, or pneumonia.  Pleura: No evidence of effusion or mass. No evidence of pneumothorax.   Vascular/Cardiac: No evidence of thoracic aortic injury or mediastinal hematoma.  Musculoskeletal: No suspicious bone lesions identified. No acute fractures identified.  Other: Endotracheal tube and nasogastric tube are seen in appropriate position.  CT ABDOMEN AND PELVIS FINDINGS  Liver: No parenchymal lacerations or masses identified. Diffuse hepatic steatosis again demonstrated.  Gallbladder/Biliary: Mural pattern of calcification is suspicious for porcelain gallbladder. Small gallstones cannot be excluded.  Pancreas: No mass, inflammatory changes, or other parenchymal abnormality identified.  Spleen:  Within normal limits in size and appearance.  Adrenal Glands:  No mass identified.  Kidneys/Urinary Tract: No masses identified. No evidence of hydronephrosis. Small left renal cysts again noted. Foley catheter seen within the bladder.  Lymph Nodes:  No pathologically enlarged lymph nodes identified.  Bowel: Mild sigmoid diverticulosis again demonstrated. No evidence of diverticulitis or other significant abnormality.  Pelvic/Reproductive Organs: No mass or other significant abnormality identified.  Vascular:  No evidence of abdominal aortic aneurysm.  Musculoskeletal:  No suspicious bone lesions identified.  Other:  None.  IMPRESSION: Mild nonspecific pulmonary infiltrate in medial left upper lobe. Differential diagnosis includes pulmonary contusion, aspiration, or pneumonia. No other traumatic injury identified.  Emphysema.  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 Small calcified gallstones cannot be excluded.  Diverticulosis. No radiographic evidence of diverticulitis.   Electronically Signed   By: Myles RosenthalJohn  Stahl M.D.   On: 11/13/2013 18:04   Dg Chest Port 1 View  11/15/2013   CLINICAL DATA:  Acute respiratory failure.  EXAM: PORTABLE CHEST - 1 VIEW  COMPARISON:  02/2014  FINDINGS: Endotracheal tube is in good position. NG tube tip is below the diaphragm. Heart size and vascularity are  normal. No infiltrates. Lungs are hyperinflated consistent with COPD.  IMPRESSION: COPD.  No acute infiltrates.  Endotracheal tube in good position.   Electronically Signed   By: Geanie CooleyJim  Maxwell M.D.   On: 11/15/2013 07:56   Dg Chest Port 1 View  11/13/2013   CLINICAL DATA:  Hypoxia  EXAM: PORTABLE CHEST - 1 VIEW  COMPARISON:  Chest radiograph and chest CT June 24, 2011  FINDINGS: A portion of the left base is not imaged. Endotracheal tube tip is 4.5 cm above the carina. Nasogastric tube tip and side port are below the diaphragm. There is no appreciable pneumothorax in the regions which are visualized.  There is elevation of the right hemidiaphragm. No edema or consolidation. Heart size and pulmonary vascularity are normal. No adenopathy. No bone lesions are appreciable in visualized regions.  IMPRESSION: Tube positions as described. No pneumothorax appreciable. Note that a portion of the left lateral base region is not visualized.  No edema or consolidation identified in regions which are  visualized.   Electronically Signed   By: Bretta Bang M.D.   On: 11/13/2013 17:26   Dg Tibia/fibula Left Port  11/13/2013   CLINICAL DATA:  Postreduction film.  EXAM: PORTABLE LEFT TIBIA AND FIBULA - 2 VIEW  COMPARISON:  Left ankle radiograph 11/13/2013.  FINDINGS: Four views of the left tibia and fibula demonstrate interval close reduction and splint fixation of previously noted tibial and fibular fractures. Previously noted trimalleolar fracture of the ankle is again noted, however, alignment is significantly improved, with decreased displacement of each of the fractures. The medial malleolar fragment remains approximately 9 mm inferiorly displaced, and the lateral malleolar fragment remains approximately 2 mm posteriorly displace. The posterior malleolar fragment is also approximately 3 mm posteriorly displaced. There is a small amount of gas in the soft tissues anterior to the tibiotalar joint, which could suggest  that this is an open fracture. There is also an oblique fracture through the proximal third of the fibular diaphysis with approximately 4 mm of anterior displacement.  IMPRESSION: 1. Interval closed reduction and splint fixation for trimalleolar fracture of the left ankle with improved alignment, as above. Small locule of gas in the soft tissues anterior to the tibiotalar joint may suggest that this is an open fracture. 2. There is also a minimally displaced oblique fracture through the proximal third of the fibular diaphysis, as above.   Electronically Signed   By: Trudie Reed M.D.   On: 11/13/2013 21:12   Dg Tibia/fibula Right Port  11/13/2013   CLINICAL DATA:  Postreduction ankle region fracture  EXAM: PORTABLE RIGHT TIBIA AND FIBULA - 2 VIEW  COMPARISON:  Right ankle November 13, 2013  FINDINGS: Frontal and lateral views were obtained with overlying immobilization device. The comminuted fracture of the distal fibula is again noted with major fracture fragments in overall near anatomic alignment. There is a fracture of the medial malleolus with alignment near anatomic in this area. Fracture of the posterior tibia is again noted with fracture fragments in near anatomic alignment. No new or apparent more proximal fractures. There appears to be reduction of the ankle mortise disruption.  IMPRESSION: Major fracture fragments in the ankle region or in near anatomic alignment. Previous ankle mortise disruption appears to have been reduced. No more proximal fractures.   Electronically Signed   By: Bretta Bang M.D.   On: 11/13/2013 21:12   Dg Ankle Left Port  11/13/2013   CLINICAL DATA:  Fall.  Ankle injury and pain.  EXAM: PORTABLE LEFT ANKLE - 1 VIEW  COMPARISON:  None.  FINDINGS: Only a single oblique view was obtained portably before the patient was taken to the catheterization lab. The shows fractures of the distal fibula and medial malleolus of the distal tibia. There is anterior and lateral  subluxation of the talus with respect to the ankle mortise.  IMPRESSION: Limited one view exam shows fractures of the distal fibula and medial malleolus, with anterior and lateral subluxation of the talus.   Electronically Signed   By: Myles Rosenthal M.D.   On: 11/13/2013 18:17   Dg Ankle Right Port  11/13/2013   CLINICAL DATA:  Found unresponsive.  Obvious deformity.  EXAM: PORTABLE RIGHT ANKLE - 2 VIEW  COMPARISON:  04/23/2007  FINDINGS: Portable AP and lateral views. Comminuted distal fibular fracture. Comminuted medial and posterior tibial fractures. Widening of the ankle mortise with relative posterior and lateral subluxation of the talar dome relative to the distal tibia. Base of fifth metatarsal intact. Overlying  soft tissue swelling.  IMPRESSION: Comminuted fracture subluxation involving the distal tibia and fibula. Suboptimally evaluated on these portable radiographs. Consider further evaluation with CT.   Electronically Signed   By: Jeronimo Greaves M.D.   On: 11/13/2013 18:32       ASSESSMENT / PLAN:  PULMONARY OETT 8/11 >> A: Acute hypercapnic respiratory failure most likely due to acute exacerbation of COPD; ddx includes PE but this is less likely given history of worsening dyspnea with a cold for several days    - improving but still  In resp acidosis and failed SBT  P:   Full vent support Scheduled nebs increased to q4h Scheduled solumedrol CXR/ABG/SBT/WUA in AM D-dimer is  positive -> check LE doppler (still pending)    CARDIOVASCULAR CVL none A: Sinus tachycardia Diastolic CHF not in exacerbation    - no evidence of AE-cHF (diastolic or systolic ) though  Echo 8/12 with mixture of chronic systolic and diastolic dysfn (ef 45% with grade 1 diast dysfn)   P:  Tele   RENAL A:  No acute issues other than low phos P:   Replete phos Monitor UOP/BMET  GASTROINTESTINAL A:  No acute issues P:   OG tube Tube feedings Pepcid for stress ulcer  prophylaxis  HEMATOLOGIC A:  No acute issues P:  Monitor for bleeding Lovenox for DVT prophylaxis  INFECTIOUS A:  AE COPD   - slowly improving  P:   Sputum culture 8/11 >> resp virus panel 8/12>> Abx: Ceftriaxone 8/11,  -> change to ancef due to fracture/or needs 11/14/13 >>11/15/13 (not going to OR), Levaquin for AECOPD >> (total 5 days)  ENDOCRINE A:  Hyperglycemia, no history of DM2 P:   ICU hyperglycemia protocol  NEUROLOGIC A:  Acute encephalopathy> sounds classic for hypercapnea   - . Neuro intact on sedation gttt P:   RASS goal: -1 Fentanyl gtt to prn Daily WUA  Ortho A: Bilateral LE fractures and Compound R tib-fib fracture P:  Conservative Rx only per Dr Mayra Reel of [facility] in 2 weeks from 11/14/13 [facility] or PCCM will need to make this appt)   TODAY'S SUMMARY: Acute hypercapnic respiratory failure due to AE COPD leading to a fall with bilateral lower extremity fractures. Plan full vent support, treatment of AE COPD. Conservative rx of ankel fracture.  No family at bedside 11/15/2013   The patient is critically ill with multiple organ systems failure and requires high complexity decision making for assessment and support, frequent evaluation and titration of therapies, application of advanced monitoring technologies and extensive interpretation of multiple databases.   Critical Care Time devoted to patient care services described in this note is  35  Minutes.  Dr. Kalman Shan, M.D., [facility].C.P Pulmonary and Critical Care Medicine Staff Physician [facility] Pager: [phone], If no answer or between  15:00h - 7:00h: call [phone]  11/15/2013 8:17 AM

## 2013-11-15 NOTE — Progress Notes (Signed)
*  PRELIMINARY RESULTS* Vascular Ultrasound Lower extremity venous duplex has been completed.  Preliminary findings: No evidence of DVT in visualized veins.  Could not image calf veins due to bandages on legs.   Farrel DemarkJill Eunice, RDMS, RVT  11/15/2013, 11:02 AM

## 2013-11-15 NOTE — Progress Notes (Signed)
Attempted SBT.  Pt did not tol d/t very low vt (despite increasing PSV), and pt desat quickly to 68%, pt very agitated.  Pt placed back on full vent support w/ 100% o2 breath x 2 min.  Sat recovered very quickly back to 93-96%.  RN and MD aware.

## 2013-11-16 ENCOUNTER — Inpatient Hospital Stay (HOSPITAL_COMMUNITY): Payer: Medicaid Other

## 2013-11-16 DIAGNOSIS — B338 Other specified viral diseases: Secondary | ICD-10-CM | POA: Diagnosis present

## 2013-11-16 DIAGNOSIS — E662 Morbid (severe) obesity with alveolar hypoventilation: Secondary | ICD-10-CM

## 2013-11-16 DIAGNOSIS — B974 Respiratory syncytial virus as the cause of diseases classified elsewhere: Secondary | ICD-10-CM | POA: Diagnosis present

## 2013-11-16 LAB — CBC WITH DIFFERENTIAL/PLATELET
BASOS ABS: 0 10*3/uL (ref 0.0–0.1)
Basophils Relative: 0 % (ref 0–1)
EOS PCT: 0 % (ref 0–5)
Eosinophils Absolute: 0 10*3/uL (ref 0.0–0.7)
HEMATOCRIT: 32.7 % — AB (ref 36.0–46.0)
HEMOGLOBIN: 9.5 g/dL — AB (ref 12.0–15.0)
Lymphocytes Relative: 15 % (ref 12–46)
Lymphs Abs: 1.8 10*3/uL (ref 0.7–4.0)
MCH: 29.9 pg (ref 26.0–34.0)
MCHC: 29.1 g/dL — AB (ref 30.0–36.0)
MCV: 102.8 fL — AB (ref 78.0–100.0)
MONO ABS: 1.2 10*3/uL — AB (ref 0.1–1.0)
MONOS PCT: 10 % (ref 3–12)
Neutro Abs: 8.9 10*3/uL — ABNORMAL HIGH (ref 1.7–7.7)
Neutrophils Relative %: 75 % (ref 43–77)
Platelets: 240 10*3/uL (ref 150–400)
RBC: 3.18 MIL/uL — ABNORMAL LOW (ref 3.87–5.11)
RDW: 14.4 % (ref 11.5–15.5)
WBC: 11.8 10*3/uL — ABNORMAL HIGH (ref 4.0–10.5)

## 2013-11-16 LAB — GLUCOSE, CAPILLARY
GLUCOSE-CAPILLARY: 219 mg/dL — AB (ref 70–99)
GLUCOSE-CAPILLARY: 211 mg/dL — AB (ref 70–99)
GLUCOSE-CAPILLARY: 243 mg/dL — AB (ref 70–99)
Glucose-Capillary: 164 mg/dL — ABNORMAL HIGH (ref 70–99)
Glucose-Capillary: 184 mg/dL — ABNORMAL HIGH (ref 70–99)
Glucose-Capillary: 236 mg/dL — ABNORMAL HIGH (ref 70–99)

## 2013-11-16 LAB — PHOSPHORUS: Phosphorus: 1.6 mg/dL — ABNORMAL LOW (ref 2.3–4.6)

## 2013-11-16 LAB — BASIC METABOLIC PANEL
Anion gap: 11 (ref 5–15)
BUN: 27 mg/dL — AB (ref 6–23)
CO2: 30 meq/L (ref 19–32)
Calcium: 8.3 mg/dL — ABNORMAL LOW (ref 8.4–10.5)
Chloride: 104 mEq/L (ref 96–112)
Creatinine, Ser: 0.64 mg/dL (ref 0.50–1.10)
GFR calc Af Amer: >90 mL/min (ref >90)
GLUCOSE: 179 mg/dL — AB (ref 70–99)
Potassium: 4.6 mEq/L (ref 3.7–5.3)
SODIUM: 145 meq/L (ref 137–147)

## 2013-11-16 LAB — PRO B NATRIURETIC PEPTIDE: PRO B NATRI PEPTIDE: 34.5 pg/mL (ref 0–125)

## 2013-11-16 LAB — LACTIC ACID, PLASMA: LACTIC ACID, VENOUS: 1.8 mmol/L (ref 0.5–2.2)

## 2013-11-16 LAB — MAGNESIUM: Magnesium: 2.2 mg/dL (ref 1.5–2.5)

## 2013-11-16 MED ORDER — DEXMEDETOMIDINE HCL IN NACL 200 MCG/50ML IV SOLN
0.0000 ug/kg/h | INTRAVENOUS | Status: AC
  Administered 2013-11-16 – 2013-11-17 (×5): 0.4 ug/kg/h via INTRAVENOUS
  Administered 2013-11-17: 1 ug/kg/h via INTRAVENOUS
  Administered 2013-11-17 – 2013-11-18 (×10): 0.4 ug/kg/h via INTRAVENOUS
  Administered 2013-11-19 (×3): 0.5 ug/kg/h via INTRAVENOUS
  Filled 2013-11-16 (×6): qty 50
  Filled 2013-11-16: qty 100
  Filled 2013-11-16: qty 50
  Filled 2013-11-16: qty 100
  Filled 2013-11-16 (×5): qty 50
  Filled 2013-11-16: qty 100
  Filled 2013-11-16 (×5): qty 50

## 2013-11-16 MED ORDER — INSULIN GLARGINE 100 UNIT/ML ~~LOC~~ SOLN
10.0000 [IU] | Freq: Once | SUBCUTANEOUS | Status: AC
  Administered 2013-11-16: 10 [IU] via SUBCUTANEOUS
  Filled 2013-11-16: qty 0.1

## 2013-11-16 MED ORDER — INSULIN GLARGINE 100 UNIT/ML ~~LOC~~ SOLN
10.0000 [IU] | Freq: Every day | SUBCUTANEOUS | Status: DC
  Administered 2013-11-16: 10 [IU] via SUBCUTANEOUS
  Filled 2013-11-16 (×2): qty 0.1

## 2013-11-16 MED ORDER — POTASSIUM PHOSPHATES 15 MMOLE/5ML IV SOLN
20.0000 meq | Freq: Once | INTRAVENOUS | Status: AC
  Administered 2013-11-16: 20 meq via INTRAVENOUS
  Filled 2013-11-16: qty 4.55

## 2013-11-16 MED FILL — Medication: Qty: 1 | Status: AC

## 2013-11-16 NOTE — Progress Notes (Signed)
Wasted 20 cc of versed in sink with Loel Dubonnetarla Faulk, RN.

## 2013-11-16 NOTE — Progress Notes (Signed)
PULMONARY / CRITICAL CARE MEDICINE   Name: [name] MRN: [id number] DOB: October 21, 1950    ADMISSION DATE:  11/13/2013 CONSULTATION DATE:  11/13/2013  REFERRING MD :  [name], EDP  CHIEF COMPLAINT:  Dyspnea, trauma  INITIAL PRESENTATION:  63 y/o female folllowed by Dr [name] admitted from the [facility] ED on 8/11 with acute hypercapnic respiratory failure and bilateral lower extremity fractures.  History is obtained by the family because the patient is encephalopathic.  She had been complaining of a cold, cough, sinus congestion and worsening dyspnea for 2-3 days prior to admission.  Her son tried to convince her to come to the ED on 8/11 after she complained of worsening dyspnea and fatigue, but she refused.  Later she went to the bathroom and then started complaining of severe dyspnea.  He called paramedics and his aunt who came to assess.  The patient tried to stand but fell, fracturing both ankles.  Her family noted that her respiratory status worsened and she became unresponsive.  She was also noted to have multiple scattered twitching movements throughout.  In the ED she was not hypoxemic but was essentially unresponsive so she was intubated.   She was found to have a compound R ankle fracture and underwent washout and splinting by orthopedics in the ED.   has a past medical history of Asthma; Obesity hypoventilation syndrome; OSA (obstructive sleep apnea); Diastolic CHF; Physical deconditioning; and HLD (hyperlipidemia).   has past surgical history that includes Cesarean section.   EVENTS/STUDIES 8/11 CT C/A/P > nonspecific infiltrate LUL, Emphysema, hepatic steatosis, porcelain gallbladder, diverticulosis 8/11 CT head/c-spine> NAICP, but extensive opacification of paranasal sinuses, C spint multifocal osteoarthritic change, no fracture 8/11 bilateral ankle films > R tib-fib fracture, left distal fibula fracture and medial malleolus 11/14/13: Normal WUA. Denies tenderness of c spine. OFf  levophed. Going to OR possibly for fracture surgery 11/15/13: Failed SBT but was on sedation at this time. Ortho prefer conservative approach due to high risk local infection status. Echo 8/12 with mixture of chronic systolic and diastolic dysfn (ef 45% with grade 1 diast dysfn)    SUBJECTIVE/OVERNIGHT/INTERVAL HX 11/16/13: Failed SBT. Not on pressors. Restless at lower dose fent gtt but follows commands and calm at higher dose fent gtt .  VITAL SIGNS: Temp:  [98.4 F (36.9 C)-99.3 F (37.4 C)] 98.7 F (37.1 C) (08/14 0901) Pulse Rate:  [74-103] 86 (08/14 1000) Resp:  [19-25] 24 (08/14 1000) BP: (109-143)/(41-77) 115/68 mmHg (08/14 1000) SpO2:  [92 %-97 %] 95 % (08/14 1000) FiO2 (%):  [30 %] 30 % (08/14 0936) Weight:  [131.5 kg (289 lb 14.5 oz)] 131.5 kg (289 lb 14.5 oz) (08/14 0500) HEMODYNAMICS:   VENTILATOR SETTINGS: Vent Mode:  [-] PRVC FiO2 (%):  [30 %] 30 % Set Rate:  [24 bmp] 24 bmp Vt Set:  [510 mL] 510 mL PEEP:  [5 cmH20] 5 cmH20 Plateau Pressure:  [27 cmH20-34 cmH20] 27 cmH20 INTAKE / OUTPUT:  Intake/Output Summary (Last 24 hours) at 11/16/13 1055 Last data filed at 11/16/13 1000  Gross per 24 hour  Intake 2503.5 ml  Output   1375 ml  Net 1128.5 ml    PHYSICAL EXAMINATION: Gen:  Critically ill looking HEENT: intubated.  PULM: diminished breath sounds bilaterally, sync with vent CV: Tachy, regular, no mgr Ab: BS+, soft, nontender Ext: warm, bilateral splints in place Derm: diffuse dryness, no clear rash Neuro: Normal WUA on fent gtt LABS: PULMONARY  Recent Labs Lab 11/13/13 1754 11/13/13 2209 11/14/13  0429 11/15/13 0449  PHART 7.291* 7.285* 7.284* 7.301*  PCO2ART 75.6* 61.7* 63.9* 60.3*  PO2ART 377.0* 86.2 112.0* 115.0*  HCO3 36.4* 28.4* 29.3* 28.8*  TCO2 39 30.3 31.3 30.7  O2SAT 100.0 96.4 97.9 98.2    CBC  Recent Labs Lab 11/14/13 0442 11/15/13 0523 11/16/13 0210  HGB 10.3* 10.3* 9.5*  HCT 35.8* 35.7* 32.7*  WBC 17.2* 8.8 11.8*  PLT  225 229 240    COAGULATION  Recent Labs Lab 11/13/13 1700  INR 0.98    CARDIAC    Recent Labs Lab 11/13/13 1653 11/13/13 2358 11/14/13 0321  TROPONINI <0.30 <0.30 <0.30    Recent Labs Lab 11/13/13 1653 11/16/13 0210  PROBNP 215.0* 34.5     CHEMISTRY  Recent Labs Lab 11/13/13 1700 11/13/13 2358 11/14/13 0442 11/15/13 0523 11/16/13 0210  NA 140  --  146 145 145  K 5.5*  --  4.9 4.6 4.6  CL 93*  --  107 105 104  CO2 37*  --  28 29 30   GLUCOSE 300*  --  160* 230* 179*  BUN 10  --  11 20 27*  CREATININE 0.63 0.69 0.64 0.68 0.64  CALCIUM 9.3  --  7.5* 8.0* 8.3*  MG  --   --   --  2.0 2.2  PHOS  --   --   --  1.4* 1.6*   Estimated Creatinine Clearance: 101.9 ml/min (by C-G formula based on Cr of 0.64).   LIVER  Recent Labs Lab 11/13/13 1700  AST 41*  ALT 35  ALKPHOS 115  BILITOT <0.2*  PROT 8.4*  ALBUMIN 3.1*  INR 0.98     INFECTIOUS  Recent Labs Lab 11/13/13 2358 11/15/13 0523 11/16/13 0210  LATICACIDVEN 4.2* 2.6* 1.8  PROCALCITON 1.77  --   --      ENDOCRINE CBG (last 3)   Recent Labs  11/16/13 0005 11/16/13 0432 11/16/13 0819  GLUCAP 164* 219* 211*         IMAGING x48h Dg Chest Port 1 View  11/16/2013   CLINICAL DATA:  Evaluate endotracheal tube.  EXAM: PORTABLE CHEST - 1 VIEW  COMPARISON:  11/15/2013.  FINDINGS: Support apparatus: Endotracheal tube unchanged. Tip appears about 5.8 cm from the carina. Enteric tube remains present.  Cardiomediastinal Silhouette: Unchanged. Rotation of the RIGHT eccentric weights the size of the cardiopericardial silhouette.  Lungs: Increasing bilateral basilar atelectasis. No airspace disease. No pneumothorax.  Effusions:  None.  Other:  Monitoring leads project over the chest.  IMPRESSION: 1. Stable support apparatus. 2. Increasing bilateral basilar atelectasis.   Electronically Signed   By: [name] M.D.   On: 11/16/2013 07:28   Dg Chest Port 1 View  11/15/2013   CLINICAL DATA:   Acute respiratory failure.  EXAM: PORTABLE CHEST - 1 VIEW  COMPARISON:  02/2014  FINDINGS: Endotracheal tube is in good position. NG tube tip is below the diaphragm. Heart size and vascularity are normal. No infiltrates. Lungs are hyperinflated consistent with COPD.  IMPRESSION: COPD.  No acute infiltrates.  Endotracheal tube in good position.   Electronically Signed   By: Geanie CooleyJim  Maxwell M.D.   On: 11/15/2013 07:56       ASSESSMENT / PLAN:  PULMONARY OETT 8/11 >> A: Acute hypercapnic respiratory failure most likely due to acute exacerbation of COPD from RSV B infection. ; ddx includes PE but this is less likely given history of worsening dyspnea with a cold for several days and duplex LE negative 11/15/13   -  failed SBT  P:   Full vent support Scheduled nebs increased to q4h Scheduled solumedrol CXR/ABG/SBT/WUA in AM    CARDIOVASCULAR CVL none A: Diastolic CHF not in exacerbation    - no evidence of AE-cHF (diastolic or systolic ) though  Echo 8/12 with mixture of chronic systolic and diastolic dysfn (ef 45% with grade 1 diast dysfn)   P:  Tele   RENAL A:  No acute issues other than low phos P:   Replete phos Monitor UOP/BMET  GASTROINTESTINAL A:  No acute issues P:   OG tube Tube feedings Pepcid for stress ulcer prophylaxis  HEMATOLOGIC A:  No acute issues 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 NIntermittently agitated  P:   RASS goal: -1 Fentanyl gtt to prn Start precedex gtt  Daily WUA  Ortho A: Bilateral LE fractures and Compound R  tib-fib fracture P:  Conservative Rx only per Dr Mayra Reel of [facility] due to very high risk for local infection and  No benefit in ultimate ability to weight bear  [facility] in 2 weeks from 11/14/13 [facility] or PCCM will need to make this appt)   TODAY'S SUMMARY: Acute hypercapnic respiratory failure due to AE COPD due to RSV-B leading to a fall with bilateral lower extremity fractures. Plan full vent support, treatment of AE COPD. Conservative rx of ankel fracture.  Niece updated at bedside 11/16/13    The patient is critically ill with multiple organ systems failure and requires high complexity decision making for assessment and support, frequent evaluation and titration of therapies, application of advanced monitoring technologies and extensive interpretation of multiple databases.   Critical Care Time devoted to patient care services described in this note is  35  Minutes.  Dr. Kalman Shan, M.D., [facility].C.P Pulmonary and Critical Care Medicine Staff Physician [facility] [place] [facility] Pager: [phone], If no answer or between  15:00h - 7:00h: call [phone]  11/16/2013 10:55 AM

## 2013-11-17 ENCOUNTER — Inpatient Hospital Stay (HOSPITAL_COMMUNITY): Payer: Medicaid Other

## 2013-11-17 DIAGNOSIS — J9610 Chronic respiratory failure, unspecified whether with hypoxia or hypercapnia: Secondary | ICD-10-CM

## 2013-11-17 LAB — BASIC METABOLIC PANEL
Anion gap: 11 (ref 5–15)
BUN: 32 mg/dL — ABNORMAL HIGH (ref 6–23)
CALCIUM: 9.1 mg/dL (ref 8.4–10.5)
CO2: 32 mEq/L (ref 19–32)
Chloride: 103 mEq/L (ref 96–112)
Creatinine, Ser: 0.58 mg/dL (ref 0.50–1.10)
GFR calc Af Amer: >90 mL/min (ref >90)
Glucose, Bld: 252 mg/dL — ABNORMAL HIGH (ref 70–99)
Potassium: 5.1 mEq/L (ref 3.7–5.3)
SODIUM: 146 meq/L (ref 137–147)

## 2013-11-17 LAB — CBC WITH DIFFERENTIAL/PLATELET
BASOS ABS: 0 10*3/uL (ref 0.0–0.1)
Basophils Relative: 0 % (ref 0–1)
Eosinophils Absolute: 0 10*3/uL (ref 0.0–0.7)
Eosinophils Relative: 0 % (ref 0–5)
HCT: 35 % — ABNORMAL LOW (ref 36.0–46.0)
Hemoglobin: 10.3 g/dL — ABNORMAL LOW (ref 12.0–15.0)
Lymphocytes Relative: 15 % (ref 12–46)
Lymphs Abs: 1.9 10*3/uL (ref 0.7–4.0)
MCH: 30 pg (ref 26.0–34.0)
MCHC: 29.4 g/dL — AB (ref 30.0–36.0)
MCV: 102 fL — ABNORMAL HIGH (ref 78.0–100.0)
MONO ABS: 0.9 10*3/uL (ref 0.1–1.0)
Monocytes Relative: 7 % (ref 3–12)
NEUTROS ABS: 9.8 10*3/uL — AB (ref 1.7–7.7)
Neutrophils Relative %: 78 % — ABNORMAL HIGH (ref 43–77)
PLATELETS: 283 10*3/uL (ref 150–400)
RBC: 3.43 MIL/uL — ABNORMAL LOW (ref 3.87–5.11)
RDW: 14.6 % (ref 11.5–15.5)
WBC: 12.6 10*3/uL — ABNORMAL HIGH (ref 4.0–10.5)

## 2013-11-17 LAB — GLUCOSE, CAPILLARY
GLUCOSE-CAPILLARY: 235 mg/dL — AB (ref 70–99)
GLUCOSE-CAPILLARY: 244 mg/dL — AB (ref 70–99)
Glucose-Capillary: 150 mg/dL — ABNORMAL HIGH (ref 70–99)
Glucose-Capillary: 185 mg/dL — ABNORMAL HIGH (ref 70–99)
Glucose-Capillary: 202 mg/dL — ABNORMAL HIGH (ref 70–99)
Glucose-Capillary: 202 mg/dL — ABNORMAL HIGH (ref 70–99)
Glucose-Capillary: 234 mg/dL — ABNORMAL HIGH (ref 70–99)

## 2013-11-17 LAB — PHOSPHORUS: PHOSPHORUS: 2.3 mg/dL (ref 2.3–4.6)

## 2013-11-17 LAB — MAGNESIUM: Magnesium: 2.4 mg/dL (ref 1.5–2.5)

## 2013-11-17 MED ORDER — BUDESONIDE 0.25 MG/2ML IN SUSP
0.2500 mg | Freq: Four times a day (QID) | RESPIRATORY_TRACT | Status: DC
  Administered 2013-11-17 – 2013-11-19 (×7): 0.25 mg via RESPIRATORY_TRACT
  Filled 2013-11-17 (×12): qty 2

## 2013-11-17 MED ORDER — IPRATROPIUM-ALBUTEROL 0.5-2.5 (3) MG/3ML IN SOLN
3.0000 mL | Freq: Four times a day (QID) | RESPIRATORY_TRACT | Status: DC
  Administered 2013-11-17 – 2013-11-25 (×31): 3 mL via RESPIRATORY_TRACT
  Filled 2013-11-17 (×31): qty 3

## 2013-11-17 MED ORDER — HYDRALAZINE HCL 20 MG/ML IJ SOLN
10.0000 mg | INTRAMUSCULAR | Status: DC | PRN
  Administered 2013-11-17: 10 mg via INTRAVENOUS
  Filled 2013-11-17 (×2): qty 1

## 2013-11-17 MED ORDER — LORAZEPAM 2 MG/ML IJ SOLN
0.5000 mg | INTRAMUSCULAR | Status: DC | PRN
  Administered 2013-11-17 – 2013-11-19 (×10): 2 mg via INTRAVENOUS
  Administered 2013-11-20: 1 mg via INTRAVENOUS
  Administered 2013-11-20 – 2013-11-22 (×7): 2 mg via INTRAVENOUS
  Administered 2013-11-23: 1 mg via INTRAVENOUS
  Filled 2013-11-17 (×19): qty 1

## 2013-11-17 MED ORDER — INSULIN GLARGINE 100 UNIT/ML ~~LOC~~ SOLN
15.0000 [IU] | Freq: Every day | SUBCUTANEOUS | Status: DC
  Administered 2013-11-17: 15 [IU] via SUBCUTANEOUS
  Filled 2013-11-17 (×2): qty 0.15

## 2013-11-17 MED ORDER — SODIUM CHLORIDE 0.9 % IJ SOLN
10.0000 mL | Freq: Two times a day (BID) | INTRAMUSCULAR | Status: DC
  Administered 2013-11-17 – 2013-11-18 (×4): 10 mL
  Administered 2013-11-19: 30 mL
  Administered 2013-11-19 – 2013-11-20 (×3): 10 mL
  Administered 2013-11-21: 20 mL
  Administered 2013-11-21 – 2013-11-30 (×13): 10 mL

## 2013-11-17 MED ORDER — FENTANYL CITRATE 0.05 MG/ML IJ SOLN
50.0000 ug | INTRAMUSCULAR | Status: DC | PRN
  Administered 2013-11-17: 100 ug via INTRAVENOUS
  Administered 2013-11-17: 200 ug via INTRAVENOUS
  Administered 2013-11-18 (×3): 100 ug via INTRAVENOUS
  Administered 2013-11-19: 200 ug via INTRAVENOUS
  Administered 2013-11-19 – 2013-11-22 (×18): 100 ug via INTRAVENOUS
  Filled 2013-11-17 (×3): qty 2
  Filled 2013-11-17: qty 4
  Filled 2013-11-17 (×13): qty 2
  Filled 2013-11-17: qty 4
  Filled 2013-11-17 (×5): qty 2
  Filled 2013-11-17: qty 4
  Filled 2013-11-17: qty 2

## 2013-11-17 MED ORDER — FREE WATER
200.0000 mL | Freq: Three times a day (TID) | Status: DC
Start: 2013-11-17 — End: 2013-11-19
  Administered 2013-11-17 – 2013-11-18 (×5): 200 mL

## 2013-11-17 MED ORDER — PREDNISONE 20 MG PO TABS
40.0000 mg | ORAL_TABLET | Freq: Every day | ORAL | Status: DC
  Administered 2013-11-18 – 2013-11-20 (×3): 40 mg
  Filled 2013-11-17 (×4): qty 2

## 2013-11-17 MED ORDER — FUROSEMIDE 10 MG/ML IJ SOLN
40.0000 mg | Freq: Once | INTRAMUSCULAR | Status: AC
  Administered 2013-11-17: 40 mg via INTRAVENOUS
  Filled 2013-11-17: qty 4

## 2013-11-17 MED ORDER — SODIUM CHLORIDE 0.9 % IJ SOLN
10.0000 mL | INTRAMUSCULAR | Status: DC | PRN
  Administered 2013-11-26: 20 mL
  Administered 2013-11-26 – 2013-11-27 (×2): 30 mL
  Administered 2013-11-27 – 2013-11-29 (×4): 10 mL
  Administered 2013-11-30 (×2): 30 mL
  Administered 2013-12-01 – 2013-12-02 (×2): 10 mL

## 2013-11-17 NOTE — Progress Notes (Signed)
Found pt vent rate changed by Dr. Bard HerbertSimmonds.  Pt appears to be tol well so far.

## 2013-11-17 NOTE — Progress Notes (Signed)
Peripherally Inserted Central Catheter/Midline Placement  The IV Nurse has discussed with the patient and/or persons authorized to consent for the patient, the purpose of this procedure and the potential benefits and risks involved with this procedure.  The benefits include less needle sticks, lab draws from the catheter and patient may be discharged home with the catheter.  Risks include, but not limited to, infection, bleeding, blood clot (thrombus formation), and puncture of an artery; nerve damage and irregular heat beat.  Alternatives to this procedure were also discussed.  Consent obtained via telephone by sister placed on chart. PICC/Midline Placement Documentation  PICC Triple Lumen 11/17/13 PICC Right Basilic 53 cm 3 cm (Active)  Indication for Insertion or Continuance of Line Limited venous access - need for IV therapy >5 days (PICC only) 11/17/2013 12:16 PM  Exposed Catheter (cm) 3 cm 11/17/2013 12:16 PM  Site Assessment Clean;Intact;Dry 11/17/2013 12:16 PM  Lumen #1 Status Infusing 11/17/2013  2:00 PM  Lumen #2 Status Infusing 11/17/2013  2:00 PM  Lumen #3 Status Flushed;Saline locked;Blood return noted 11/17/2013 12:16 PM  Dressing Type Transparent 11/17/2013 12:16 PM  Dressing Status Clean;Dry;Intact;Antimicrobial disc in place 11/17/2013 12:16 PM  Line Care Connections checked and tightened 11/17/2013 12:16 PM  Line Adjustment (NICU/IV Team Only) No 11/17/2013 12:16 PM  Dressing Intervention New dressing 11/17/2013 12:16 PM  Dressing Change Due 11/24/13 11/17/2013 12:16 PM       Elliot Dallyiggs, Elier Zellars Wright 11/17/2013, 2:53 PM

## 2013-11-17 NOTE — Progress Notes (Signed)
eLink Physician-Brief Progress Note Patient Name: [name] DOB: 1950-06-17 MRN: [id number]   Date of Service  11/17/2013  HPI/Events of Note  Rn say patient anxious despite fent prn an d precedex gtt  eICU Interventions  Add ativan prn     Intervention Category Intermediate Interventions: Other:  Yvana Samonte 11/17/2013, 8:32 PM

## 2013-11-17 NOTE — Progress Notes (Signed)
PULMONARY / CRITICAL CARE MEDICINE   Name: [name] MRN: [id number] DOB: Jul 25, 1950    ADMISSION DATE:  11/13/2013 CONSULTATION DATE:  11/13/2013  REFERRING MD :  [name], EDP  CHIEF COMPLAINT:  Dyspnea, trauma  INITIAL PRESENTATION:  63 y/o female folllowed by Dr Shelle Iron admitted from the [facility] ED on 8/11 with acute hypercapnic respiratory failure and bilateral lower extremity fractures.  History is obtained by the family because the patient is encephalopathic.  She had been complaining of a cold, cough, sinus congestion and worsening dyspnea for 2-3 days prior to admission.  Her son tried to convince her to come to the ED on 8/11 after she complained of worsening dyspnea and fatigue, but she refused.  Later she went to the bathroom and then started complaining of severe dyspnea.  He called paramedics and his aunt who came to assess.  The patient tried to stand but fell, fracturing both ankles.  Her family noted that her respiratory status worsened and she became unresponsive.  She was also noted to have multiple scattered twitching movements throughout.  In the ED she was not hypoxemic but was essentially unresponsive so she was intubated.   She was found to have a compound R ankle fracture and underwent washout and splinting by orthopedics in the ED.   has a past medical history of Asthma; Obesity hypoventilation syndrome; OSA (obstructive sleep apnea); Diastolic CHF; Physical deconditioning; and HLD (hyperlipidemia).   has past surgical history that includes Cesarean section.   EVENTS/STUDIES 8/11 CT C/A/P > nonspecific infiltrate LUL, Emphysema, hepatic steatosis, porcelain gallbladder, diverticulosis 8/11 CT head/c-spine> NAICP, but extensive opacification of paranasal sinuses, C spint multifocal osteoarthritic change, no fracture 8/11 bilateral ankle films > R tib-fib fracture, left distal fibula fracture and medial malleolus 11/14/13: Normal WUA. Denies tenderness of c spine. OFf  levophed. Going to OR possibly for fracture surgery 11/15/13: Failed SBT but was on sedation at this time. Ortho prefer conservative approach due to high risk local infection status. Echo 8/12 with mixture of chronic systolic and diastolic dysfn (ef 45% with grade 1 diast dysfn)    SUBJECTIVE/OVERNIGHT/INTERVAL HX 11/17/13: Failed SBT.  Agitated easily , .  VITAL SIGNS: Temp:  [97.9 F (36.6 C)-98.6 F (37 C)] 98.2 F (36.8 C) (08/15 0700) Pulse Rate:  [63-114] 104 (08/15 0845) Resp:  [17-28] 26 (08/15 0845) BP: (115-185)/(66-155) 150/73 mmHg (08/15 0845) SpO2:  [84 %-99 %] 97 % (08/15 0845) FiO2 (%):  [30 %] 30 % (08/15 0855) Weight:  [132.3 kg (291 lb 10.7 oz)] 132.3 kg (291 lb 10.7 oz) (08/15 0445) HEMODYNAMICS:   VENTILATOR SETTINGS: Vent Mode:  [-] PRVC FiO2 (%):  [30 %] 30 % Set Rate:  [24 bmp] 24 bmp Vt Set:  [510 mL] 510 mL PEEP:  [5 cmH20] 5 cmH20 Plateau Pressure:  [27 cmH20-36 cmH20] 31 cmH20 INTAKE / OUTPUT:  Intake/Output Summary (Last 24 hours) at 11/17/13 0942 Last data filed at 11/17/13 0600  Gross per 24 hour  Intake 1896.76 ml  Output    675 ml  Net 1221.76 ml    PHYSICAL EXAMINATION: Gen:  Critically ill looking, morbidly obese  HEENT: intubated.  PULM: diminished breath sounds bilaterally, sync with vent CV: Tachy, regular, no mgr Ab: BS+, soft, nontender, morbdily obese  Ext: warm, bilateral splints in place Derm: diffuse dryness, with patches of dry skin  Neuro : follows commands, intermittent agitation  LABS: PULMONARY  Recent Labs Lab 11/13/13 1754 11/13/13 2209 11/14/13 0429 11/15/13 0449  PHART 7.291* 7.285* 7.284* 7.301*  PCO2ART 75.6* 61.7* 63.9* 60.3*  PO2ART 377.0* 86.2 112.0* 115.0*  HCO3 36.4* 28.4* 29.3* 28.8*  TCO2 39 30.3 31.3 30.7  O2SAT 100.0 96.4 97.9 98.2    CBC  Recent Labs Lab 11/15/13 0523 11/16/13 0210 11/17/13 0230  HGB 10.3* 9.5* 10.3*  HCT 35.7* 32.7* 35.0*  WBC 8.8 11.8* 12.6*  PLT 229 240 283     COAGULATION  Recent Labs Lab 11/13/13 1700  INR 0.98    CARDIAC    Recent Labs Lab 11/13/13 1653 11/13/13 2358 11/14/13 0321  TROPONINI <0.30 <0.30 <0.30    Recent Labs Lab 11/13/13 1653 11/16/13 0210  PROBNP 215.0* 34.5     CHEMISTRY  Recent Labs Lab 11/13/13 1700 11/13/13 2358 11/14/13 0442 11/15/13 0523 11/16/13 0210 11/17/13 0230  NA 140  --  146 145 145 146  K 5.5*  --  4.9 4.6 4.6 5.1  CL 93*  --  107 105 104 103  CO2 37*  --  28 29 30  32  GLUCOSE 300*  --  160* 230* 179* 252*  BUN 10  --  11 20 27* 32*  CREATININE 0.63 0.69 0.64 0.68 0.64 0.58  CALCIUM 9.3  --  7.5* 8.0* 8.3* 9.1  MG  --   --   --  2.0 2.2 2.4  PHOS  --   --   --  1.4* 1.6* 2.3   Estimated Creatinine Clearance: 102.4 ml/min (by C-G formula based on Cr of 0.58).   LIVER  Recent Labs Lab 11/13/13 1700  AST 41*  ALT 35  ALKPHOS 115  BILITOT <0.2*  PROT 8.4*  ALBUMIN 3.1*  INR 0.98     INFECTIOUS  Recent Labs Lab 11/13/13 2358 11/15/13 0523 11/16/13 0210  LATICACIDVEN 4.2* 2.6* 1.8  PROCALCITON 1.77  --   --      ENDOCRINE CBG (last 3)   Recent Labs  11/17/13 11/17/13 0429 11/17/13 0708  GLUCAP 234* 235* 244*         IMAGING x48h Dg Chest Port 1 View  11/17/2013   CLINICAL DATA:  Evaluate ET tube placement.  EXAM: PORTABLE CHEST - 1 VIEW  COMPARISON:  11/16/2013.  FINDINGS: The patient is rotated. Cardiac size within normal limits. Endotracheal tube unchanged, 5.9 cm above carina. Unchanged enteric tube.  Mild basilar atelectasis is stable. No focal areas of consolidation or significant pleural effusions.  IMPRESSION: Stable chest.  ETT good position.   Electronically Signed   By: [name] M.D.   On: 11/17/2013 07:45   Dg Chest Port 1 View  11/16/2013   CLINICAL DATA:  Evaluate endotracheal tube.  EXAM: PORTABLE CHEST - 1 VIEW  COMPARISON:  11/15/2013.  FINDINGS: Support apparatus: Endotracheal tube unchanged. Tip appears about 5.8 cm from  the carina. Enteric tube remains present.  Cardiomediastinal Silhouette: Unchanged. Rotation of the RIGHT eccentric weights the size of the cardiopericardial silhouette.  Lungs: Increasing bilateral basilar atelectasis. No airspace disease. No pneumothorax.  Effusions:  None.  Other:  Monitoring leads project over the chest.  IMPRESSION: 1. Stable support apparatus. 2. Increasing bilateral basilar atelectasis.   Electronically Signed   By: Andreas Newport M.D.   On: 11/16/2013 07:28       ASSESSMENT / PLAN:  PULMONARY OETT 8/11 >> A: Acute hypercapnic respiratory failure most likely due to acute exacerbation of COPD from RSV B infection. ; ddx includes PE but this is less likely given history of worsening dyspnea with  a cold for several days and duplex LE negative 11/15/13 8/15> - failed SBT  P:   Full vent support Scheduled nebs increased to q4h Transition solumedrol to pred in am  CXR/ABG/SBT/WUA in AM    CARDIOVASCULAR CVL none A: Diastolic CHF   - no evidence of AE-cHF (diastolic or systolic ) though  Echo 8/12 with mixture of chronic systolic and diastolic dysfn (ef 45% with grade 1 diast dysfn)>bnp low  8/15 >I/O + 2.5 L (tot since admit 6.5L )    P:  Lasix 40mg  IV x 1  Avoid fluid overload -I/O goal even/neg bal    RENAL A:  Hypophos-resolved w/ repletion on 8/14   P:   Monitor UOP/BMET  GASTROINTESTINAL A:  Nutrition  Morbid obesity  P:   OG tube Tube feedings Pepcid for stress ulcer prophylaxis  HEMATOLOGIC A:  Anemia  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  Sputum culture 8/11 >> resp virus panel 8/12>> RSV B  PLAN Abx: Ceftriaxone 8/11,  -> change to ancef due to fracture/or needs 11/14/13 >>11/15/13 (not going to OR), Levaquin for AECOPD >> (total 5 days)    ENDOCRINE A:  Hyperglycemia, no history of DM2 8/15 >BS tr up  P:   ICU hyperglycemia protocol Increase Lantus 15u   NEUROLOGIC A:   Acute encephalopathy> sounds classic for hypercapnea 8/15 >Intermittently agitated  P:   RASS goal: -1 Fentanyl gtt to prn  precedex gtt  Daily WUA  Ortho A: Bilateral LE fractures and Compound R tib-fib fracture P:  Conservative Rx only per Dr Mayra ReelN Michael Xu of [facility] due to very high risk for local infection and  No benefit in ultimate ability to weight bear [facility] in 2 weeks from 11/14/13 [facility] or PCCM will need to make this appt)   TODAY'S SUMMARY: Acute hypercapnic respiratory failure due to AE COPD due to RSV-B leading to a fall with bilateral lower extremity fractures. Plan full vent support, treatment of AE COPD. Conservative rx of ankel fracture.     Tammy Parrett NP-C  [place] [facility]  [phone]     PCCM ATTENDING: I have interviewed and examined the patient and reviewed the database. I have formulated the assessment and plan as reflected in the note above with amendments made by me. 30 mins of direct critical care time provided  Billy Fischeravid Donie Moulton, MD;  PCCM service; Mobile [phone]  11/17/2013 9:42 AM

## 2013-11-18 ENCOUNTER — Inpatient Hospital Stay (HOSPITAL_COMMUNITY): Payer: Medicaid Other

## 2013-11-18 DIAGNOSIS — G9340 Encephalopathy, unspecified: Secondary | ICD-10-CM

## 2013-11-18 DIAGNOSIS — J9600 Acute respiratory failure, unspecified whether with hypoxia or hypercapnia: Secondary | ICD-10-CM

## 2013-11-18 LAB — BASIC METABOLIC PANEL
Anion gap: 9 (ref 5–15)
BUN: 35 mg/dL — ABNORMAL HIGH (ref 6–23)
CO2: 37 meq/L — AB (ref 19–32)
Calcium: 9 mg/dL (ref 8.4–10.5)
Chloride: 101 mEq/L (ref 96–112)
Creatinine, Ser: 0.61 mg/dL (ref 0.50–1.10)
GFR calc Af Amer: >90 mL/min (ref >90)
GFR calc non Af Amer: >90 mL/min (ref >90)
GLUCOSE: 243 mg/dL — AB (ref 70–99)
Potassium: 4.8 mEq/L (ref 3.7–5.3)
SODIUM: 147 meq/L (ref 137–147)

## 2013-11-18 LAB — GLUCOSE, CAPILLARY
GLUCOSE-CAPILLARY: 136 mg/dL — AB (ref 70–99)
GLUCOSE-CAPILLARY: 156 mg/dL — AB (ref 70–99)
GLUCOSE-CAPILLARY: 187 mg/dL — AB (ref 70–99)
GLUCOSE-CAPILLARY: 202 mg/dL — AB (ref 70–99)
GLUCOSE-CAPILLARY: 218 mg/dL — AB (ref 70–99)
GLUCOSE-CAPILLARY: 238 mg/dL — AB (ref 70–99)

## 2013-11-18 LAB — CULTURE, RESPIRATORY

## 2013-11-18 LAB — CBC WITH DIFFERENTIAL/PLATELET
BASOS ABS: 0 10*3/uL (ref 0.0–0.1)
BASOS PCT: 0 % (ref 0–1)
EOS ABS: 0 10*3/uL (ref 0.0–0.7)
Eosinophils Relative: 0 % (ref 0–5)
HEMATOCRIT: 33.7 % — AB (ref 36.0–46.0)
HEMOGLOBIN: 10 g/dL — AB (ref 12.0–15.0)
Lymphocytes Relative: 14 % (ref 12–46)
Lymphs Abs: 2 10*3/uL (ref 0.7–4.0)
MCH: 30.1 pg (ref 26.0–34.0)
MCHC: 29.7 g/dL — AB (ref 30.0–36.0)
MCV: 101.5 fL — ABNORMAL HIGH (ref 78.0–100.0)
MONOS PCT: 6 % (ref 3–12)
Monocytes Absolute: 0.8 10*3/uL (ref 0.1–1.0)
NEUTROS ABS: 11.6 10*3/uL — AB (ref 1.7–7.7)
Neutrophils Relative %: 80 % — ABNORMAL HIGH (ref 43–77)
Platelets: 337 10*3/uL (ref 150–400)
RBC: 3.32 MIL/uL — ABNORMAL LOW (ref 3.87–5.11)
RDW: 14.5 % (ref 11.5–15.5)
WBC: 14.5 10*3/uL — ABNORMAL HIGH (ref 4.0–10.5)

## 2013-11-18 LAB — PHOSPHORUS: Phosphorus: 3.2 mg/dL (ref 2.3–4.6)

## 2013-11-18 LAB — CULTURE, RESPIRATORY W GRAM STAIN: Special Requests: NORMAL

## 2013-11-18 LAB — MAGNESIUM: Magnesium: 2.4 mg/dL (ref 1.5–2.5)

## 2013-11-18 LAB — PRO B NATRIURETIC PEPTIDE: PRO B NATRI PEPTIDE: 388.9 pg/mL — AB (ref 0–125)

## 2013-11-18 MED ORDER — INSULIN GLARGINE 100 UNIT/ML ~~LOC~~ SOLN
20.0000 [IU] | Freq: Every day | SUBCUTANEOUS | Status: DC
  Administered 2013-11-18 – 2013-11-22 (×5): 20 [IU] via SUBCUTANEOUS
  Filled 2013-11-18 (×6): qty 0.2

## 2013-11-18 MED ORDER — FUROSEMIDE 10 MG/ML IJ SOLN
40.0000 mg | Freq: Once | INTRAMUSCULAR | Status: AC
  Administered 2013-11-18: 40 mg via INTRAVENOUS
  Filled 2013-11-18: qty 4

## 2013-11-18 NOTE — Progress Notes (Signed)
PULMONARY / CRITICAL CARE MEDICINE   Name: [name] MRN: [id number] DOB: [birth year]    ADMISSION DATE:  11/13/2013 CONSULTATION DATE:  11/13/2013  REFERRING MD :  [name], EDP  CHIEF COMPLAINT:  Dyspnea, trauma  INITIAL PRESENTATION:  63 y/o female folllowed by Dr Shelle Iron admitted from the [facility] ED on 8/11 with acute hypercapnic respiratory failure and bilateral lower extremity fractures.  History is obtained by the family because the patient is encephalopathic.  She had been complaining of a cold, cough, sinus congestion and worsening dyspnea for 2-3 days prior to admission.  Her son tried to convince her to come to the ED on 8/11 after she complained of worsening dyspnea and fatigue, but she refused.  Later she went to the bathroom and then started complaining of severe dyspnea.  He called paramedics and his aunt who came to assess.  The patient tried to stand but fell, fracturing both ankles.  Her family noted that her respiratory status worsened and she became unresponsive.  She was also noted to have multiple scattered twitching movements throughout.  In the ED she was not hypoxemic but was essentially unresponsive so she was intubated.   She was found to have a compound R ankle fracture and underwent washout and splinting by orthopedics in the ED.   has a past medical history of Asthma; Obesity hypoventilation syndrome; OSA (obstructive sleep apnea); Diastolic CHF; Physical deconditioning; and HLD (hyperlipidemia).   has past surgical history that includes Cesarean section.   EVENTS/STUDIES 8/11 CT C/A/P > nonspecific infiltrate LUL, Emphysema, hepatic steatosis, porcelain gallbladder, diverticulosis 8/11 CT head/c-spine> NAICP, but extensive opacification of paranasal sinuses, C spint multifocal osteoarthritic change, no fracture 8/11 bilateral ankle films > R tib-fib fracture, left distal fibula fracture and medial malleolus 11/14/13: Normal WUA. Denies tenderness of c spine. OFf  levophed. Going to OR possibly for fracture surgery 11/15/13: Failed SBT but was on sedation at this time. Ortho prefer conservative approach due to high risk local infection status. Echo 8/12 with mixture of chronic systolic and diastolic dysfn (ef 45% with grade 1 diast dysfn)    SUBJECTIVE/OVERNIGHT/INTERVAL HX 11/18/13: following commands, agitated overnight  On precedex , received ativan  Awake and alert this am on precedex , following commands , feels sob often w/ increased anxiety    VITAL SIGNS: Temp:  [98.3 F (36.8 C)-99 F (37.2 C)] 98.3 F (36.8 C) (08/16 0840) Pulse Rate:  [66-104] 90 (08/16 0851) Resp:  [14-33] 23 (08/16 0851) BP: (83-150)/(60-106) 116/74 mmHg (08/16 0851) SpO2:  [63 %-100 %] 95 % (08/16 0851) FiO2 (%):  [30 %] 30 % (08/16 0851) Weight:  [131.8 kg (290 lb 9.1 oz)] 131.8 kg (290 lb 9.1 oz) (08/16 0500) HEMODYNAMICS:   VENTILATOR SETTINGS: Vent Mode:  [-] PRVC FiO2 (%):  [30 %] 30 % Set Rate:  [16 bmp-24 bmp] 16 bmp Vt Set:  [510 mL] 510 mL PEEP:  [5 cmH20] 5 cmH20 Plateau Pressure:  [24 cmH20-32 cmH20] 24 cmH20 INTAKE / OUTPUT:  Intake/Output Summary (Last 24 hours) at 11/18/13 4098 Last data filed at 11/18/13 0800  Gross per 24 hour  Intake 2681.31 ml  Output   2100 ml  Net 581.31 ml    PHYSICAL EXAMINATION: Gen:  Critically ill looking, morbidly obese  HEENT: intubated.  PULM: diminished breath sounds bilaterally, sync with vent CV:  regular, no mgr Ab: BS+, soft, nontender, morbdily obese  Ext: warm, bilateral splints in place Derm: diffuse dryness, with patches of dry skin  Neuro : follows commands, intermittent agitation   LABS: PULMONARY  Recent Labs Lab 11/13/13 1754 11/13/13 2209 11/14/13 0429 11/15/13 0449  PHART 7.291* 7.285* 7.284* 7.301*  PCO2ART 75.6* 61.7* 63.9* 60.3*  PO2ART 377.0* 86.2 112.0* 115.0*  HCO3 36.4* 28.4* 29.3* 28.8*  TCO2 39 30.3 31.3 30.7  O2SAT 100.0 96.4 97.9 98.2    CBC  Recent Labs Lab  11/16/13 0210 11/17/13 0230 11/18/13 0241  HGB 9.5* 10.3* 10.0*  HCT 32.7* 35.0* 33.7*  WBC 11.8* 12.6* 14.5*  PLT 240 283 337    COAGULATION  Recent Labs Lab 11/13/13 1700  INR 0.98    CARDIAC    Recent Labs Lab 11/13/13 1653 11/13/13 2358 11/14/13 0321  TROPONINI <0.30 <0.30 <0.30    Recent Labs Lab 11/13/13 1653 11/16/13 0210 11/18/13 0241  PROBNP 215.0* 34.5 388.9*     CHEMISTRY  Recent Labs Lab 11/14/13 0442 11/15/13 0523 11/16/13 0210 11/17/13 0230 11/18/13 0241  NA 146 145 145 146 147  K 4.9 4.6 4.6 5.1 4.8  CL 107 105 104 103 101  CO2 28 29 30  32 37*  GLUCOSE 160* 230* 179* 252* 243*  BUN 11 20 27* 32* 35*  CREATININE 0.64 0.68 0.64 0.58 0.61  CALCIUM 7.5* 8.0* 8.3* 9.1 9.0  MG  --  2.0 2.2 2.4 2.4  PHOS  --  1.4* 1.6* 2.3 3.2   Estimated Creatinine Clearance: 102.2 ml/min (by C-G formula based on Cr of 0.61).   LIVER  Recent Labs Lab 11/13/13 1700  AST 41*  ALT 35  ALKPHOS 115  BILITOT <0.2*  PROT 8.4*  ALBUMIN 3.1*  INR 0.98     INFECTIOUS  Recent Labs Lab 11/13/13 2358 11/15/13 0523 11/16/13 0210  LATICACIDVEN 4.2* 2.6* 1.8  PROCALCITON 1.77  --   --      ENDOCRINE CBG (last 3)   Recent Labs  11/17/13 2340 11/18/13 0341 11/18/13 0706  GLUCAP 218* 238* 202*         IMAGING x48h Dg Chest Port 1 View  11/18/2013   CLINICAL DATA:  Intubated.  EXAM: PORTABLE CHEST - 1 VIEW  COMPARISON:  11/17/2013  FINDINGS: Endotracheal tube remains in place with tip well above the carina. Enteric tube courses towards the left upper abdomen with tip not imaged. Left PICC remains in place with tip projecting over the lower SVC. Cardiomediastinal silhouette is unchanged. There is mild, streaky opacity in the left lung base, stable to slightly increased from prior. Right lung remains grossly clear. No pleural effusion or pneumothorax is identified.  IMPRESSION: Streaky left basilar opacity, which may represent atelectasis  versus developing infectious infiltrate.   Electronically Signed   By: Sebastian Ache   On: 11/18/2013 08:36   Dg Chest Port 1 View  11/17/2013   CLINICAL DATA:  PICC line placement  EXAM: PORTABLE CHEST - 1 VIEW  COMPARISON:  11/17/2013 at 5:32 a.m.  FINDINGS: New left PICC has its tip in the lower superior vena cava, well positioned.  Endotracheal tube and nasogastric tube are stable in well positioned.  No acute findings in the lungs. Cardiac silhouette is normal in size. Normal mediastinal and hilar contours.  IMPRESSION: New left PICC is well positioned with its tip in the lower superior vena cava. No other change from the prior study.   Electronically Signed   By: Amie Portland M.D.   On: 11/17/2013 13:20   Dg Chest Port 1 View  11/17/2013   CLINICAL DATA:  Evaluate ET  tube placement.  EXAM: PORTABLE CHEST - 1 VIEW  COMPARISON:  11/16/2013.  FINDINGS: The patient is rotated. Cardiac size within normal limits. Endotracheal tube unchanged, 5.9 cm above carina. Unchanged enteric tube.  Mild basilar atelectasis is stable. No focal areas of consolidation or significant pleural effusions.  IMPRESSION: Stable chest.  ETT good position.   Electronically Signed   By: Davonna BellingJohn  Curnes M.D.   On: 11/17/2013 07:45       ASSESSMENT / PLAN:  PULMONARY OETT 8/11 >> A: Acute hypercapnic respiratory failure most likely due to acute exacerbation of COPD from RSV B infection. ; ddx includes PE but this is less likely given history of worsening dyspnea with a cold for several days and duplex LE negative 11/15/13 8/16> - failed wean this am with desats and increased WOB , CXR w/ streaky left basilar opacity  Remains positive bal despite diuresis yest  P:   Full vent support-cont to assess for wean  Scheduled BD/ICS Nebs  Taper prednisone over next week  CXR/SBT/WUA in AM Repeat lasix 40mg  IV    CARDIOVASCULAR CVL none A: Diastolic CHF   - no evidence of AE-cHF (diastolic or systolic ) though  Echo 8/12 with  mixture of chronic systolic and diastolic dysfn (ef 45% with grade 1 diast dysfn)>bnp low  8/16 >improved UOP but remains +I/O bal , bnp tr up 34>388   P:  Repeat Lasix 40mg  IV x 1  Avoid fluid overload -I/O goal even/neg bal     RENAL A:  Hypophos-resolved w/ repletion on 8/14   P:   Monitor UOP/BMET  GASTROINTESTINAL A:  Nutrition  Morbid obesity  P:   OG tube Tube feedings Pepcid for stress ulcer prophylaxis  HEMATOLOGIC A:  Anemia  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  Sputum culture 8/11 >> resp virus panel 8/12>> RSV B  PLAN Abx: Ceftriaxone 8/11,  -> change to ancef due to fracture/or needs 11/14/13 >>11/15/13 (not going to OR), Levaquin for AECOPD >> (total 5 days) Hold on additional abx at this time    ENDOCRINE A:  Hyperglycemia, no history of DM2 8/15 >BS tr up  P:   SSI  protocol Increase Lantus 20u   NEUROLOGIC A:  Acute encephalopathy> sounds classic for hypercapnea 8/16 >Intermittently agitated, worse overnight w/ ativan given -more sedation   P:   RASS goal: -1-0 Fentanyl gtt to prn  precedex gtt  Daily WUA  Ortho A: Bilateral LE fractures and Compound R tib-fib fracture P:  Conservative Rx only per Dr Mayra ReelN Michael Xu of [facility] due to very high risk for local infection and  No benefit in ultimate ability to weight bear [facility] in 2 weeks from 11/14/13 [facility] or PCCM will need to make this appt)   TODAY'S SUMMARY: Acute hypercapnic respiratory failure due to AE COPD due to RSV-B leading to a fall with bilateral lower extremity fractures. Plan full vent support, treatment of AE COPD. Conservative rx of ankel fracture.   Diuresis again today in hopes this will aide in extubation.   Tammy Parrett NP-C  [facility]  [phone]      PCCM ATTENDING: I have interviewed and examined the patient and reviewed the database. I have formulated the assessment and plan as  reflected in the note above with amendments made by me. 30 mins of direct critical care time provided. Very severe obstruction persists. Very poor  prognosis. Need to address advanced directives  Billy Fischer, MD;  PCCM service; Mobile [phone]  11/18/2013 9:18 AM

## 2013-11-19 ENCOUNTER — Encounter (HOSPITAL_COMMUNITY): Payer: Self-pay | Admitting: *Deleted

## 2013-11-19 ENCOUNTER — Inpatient Hospital Stay (HOSPITAL_COMMUNITY): Payer: Medicaid Other

## 2013-11-19 DIAGNOSIS — L851 Acquired keratosis [keratoderma] palmaris et plantaris: Secondary | ICD-10-CM

## 2013-11-19 LAB — PRO B NATRIURETIC PEPTIDE: PRO B NATRI PEPTIDE: 227.7 pg/mL — AB (ref 0–125)

## 2013-11-19 LAB — CBC
HCT: 35.9 % — ABNORMAL LOW (ref 36.0–46.0)
Hemoglobin: 10.7 g/dL — ABNORMAL LOW (ref 12.0–15.0)
MCH: 30.3 pg (ref 26.0–34.0)
MCHC: 29.8 g/dL — ABNORMAL LOW (ref 30.0–36.0)
MCV: 101.7 fL — ABNORMAL HIGH (ref 78.0–100.0)
PLATELETS: 308 10*3/uL (ref 150–400)
RBC: 3.53 MIL/uL — AB (ref 3.87–5.11)
RDW: 14.6 % (ref 11.5–15.5)
WBC: 21.3 10*3/uL — ABNORMAL HIGH (ref 4.0–10.5)

## 2013-11-19 LAB — GLUCOSE, CAPILLARY
GLUCOSE-CAPILLARY: 127 mg/dL — AB (ref 70–99)
GLUCOSE-CAPILLARY: 162 mg/dL — AB (ref 70–99)
Glucose-Capillary: 110 mg/dL — ABNORMAL HIGH (ref 70–99)
Glucose-Capillary: 170 mg/dL — ABNORMAL HIGH (ref 70–99)
Glucose-Capillary: 229 mg/dL — ABNORMAL HIGH (ref 70–99)
Glucose-Capillary: 163 mg/dL — ABNORMAL HIGH (ref 70–99)

## 2013-11-19 LAB — BASIC METABOLIC PANEL
ANION GAP: 8 (ref 5–15)
BUN: 44 mg/dL — ABNORMAL HIGH (ref 6–23)
CHLORIDE: 103 meq/L (ref 96–112)
CO2: 40 meq/L — AB (ref 19–32)
Calcium: 9.4 mg/dL (ref 8.4–10.5)
Creatinine, Ser: 0.59 mg/dL (ref 0.50–1.10)
GFR calc Af Amer: >90 mL/min (ref >90)
GFR calc non Af Amer: >90 mL/min (ref >90)
Glucose, Bld: 138 mg/dL — ABNORMAL HIGH (ref 70–99)
POTASSIUM: 4 meq/L (ref 3.7–5.3)
SODIUM: 151 meq/L — AB (ref 137–147)

## 2013-11-19 MED ORDER — FUROSEMIDE 10 MG/ML IJ SOLN: 40.0000 mg | Freq: Two times a day (BID) | INTRAMUSCULAR | Status: DC

## 2013-11-19 MED ORDER — BUDESONIDE 0.25 MG/2ML IN SUSP
0.5000 mg | Freq: Two times a day (BID) | RESPIRATORY_TRACT | Status: DC
  Administered 2013-11-19: 0.25 mg via RESPIRATORY_TRACT
  Administered 2013-11-20 – 2013-11-21 (×3): 0.5 mg via RESPIRATORY_TRACT
  Filled 2013-11-19 (×10): qty 4

## 2013-11-19 MED ORDER — FUROSEMIDE 10 MG/ML IJ SOLN
20.0000 mg | Freq: Two times a day (BID) | INTRAMUSCULAR | Status: DC
  Administered 2013-11-19 (×2): 20 mg via INTRAVENOUS
  Filled 2013-11-19 (×4): qty 2

## 2013-11-19 MED ORDER — FREE WATER
200.0000 mL | Status: DC
  Administered 2013-11-19 – 2013-11-20 (×7): 200 mL

## 2013-11-19 NOTE — Progress Notes (Signed)
PULMONARY / CRITICAL CARE MEDICINE   Name: [name] MRN: [id number] DOB: Jun 30, 1950    ADMISSION DATE:  11/13/2013 CONSULTATION DATE:  11/13/2013  REFERRING MD :  Loretha StaplerWofford, EDP  CHIEF COMPLAINT:  Dyspnea, trauma  INITIAL PRESENTATION:  63 y/o female folllowed by Dr Shelle Ironlance [facility] with acute hypercapnic respiratory failure and bilateral lower extremity fractures.  Being managed for resp failure, poor weaning, agitation.   has a past medical history of Asthma; Obesity hypoventilation syndrome; OSA (obstructive sleep apnea); Diastolic CHF; Physical deconditioning; and HLD (hyperlipidemia).   has past surgical history that includes Cesarean section.   EVENTS/STUDIES 8/11 CT C/A/P > nonspecific infiltrate LUL, Emphysema, hepatic steatosis, porcelain gallbladder, diverticulosis 8/11 CT head/c-spine> NAICP, but extensive opacification of paranasal sinuses, C spint multifocal osteoarthritic change, no fracture 8/11 bilateral ankle films > R tib-fib fracture, left distal fibula fracture and medial malleolus 11/14/13: Normal WUA. Denies tenderness of c spine. OFf levophed. Going to OR possibly for fracture surgery 11/15/13: Failed SBT but was on sedation at this time. Ortho prefer conservative approach due to high risk local infection status.  8/13 Echo 8/12 with mixture of chronic systolic and diastolic dysfn (ef 45% with grade 1 diast dysfn)  SUBJECTIVE/OVERNIGHT/INTERVAL HX Not able to wean well  VITAL SIGNS: Temp:  [97.6 F (36.4 C)-98.7 F (37.1 C)] 97.6 F (36.4 C) (08/17 0739) Pulse Rate:  [73-118] 118 (08/17 0900) Resp:  [13-24] 17 (08/17 0900) BP: (81-126)/(55-75) 106/59 mmHg (08/17 0900) SpO2:  [83 %-100 %] 83 % (08/17 0900) FiO2 (%):  [30 %-50 %] 50 % (08/17 0908) Weight:  [130.5 kg (287 lb 11.2 oz)] 130.5 kg (287 lb 11.2 oz) (08/17 0401) HEMODYNAMICS:   VENTILATOR SETTINGS: Vent Mode:  [-] PRVC FiO2 (%):  [30 %-50 %] 50 % Set Rate:  [16 bmp]  16 bmp Vt Set:  [510 mL] 510 mL PEEP:  [5 cmH20] 5 cmH20 Plateau Pressure:  [23 cmH20-39 cmH20] 28 cmH20 INTAKE / OUTPUT:  Intake/Output Summary (Last 24 hours) at 11/19/13 1027 Last data filed at 11/19/13 0934  Gross per 24 hour  Intake 2982.08 ml  Output   2285 ml  Net 697.08 ml    PHYSICAL EXAMINATION: Gen:  Critically ill looking, morbidly obese  HEENT: intubated, jvd PULM: very reduced CV:  s1 s 2 regular, no mgr Ab: BS+, soft, nontender, morbdily obese  Ext: warm, bilateral splints in place Derm: diffuse dryness, with patches of dry skin  Neuro : follows commands, intermittent agitation, rass -1  LABS: PULMONARY  Recent Labs Lab 11/13/13 1754 11/13/13 2209 11/14/13 0429 11/15/13 0449  PHART 7.291* 7.285* 7.284* 7.301*  PCO2ART 75.6* 61.7* 63.9* 60.3*  PO2ART 377.0* 86.2 112.0* 115.0*  HCO3 36.4* 28.4* 29.3* 28.8*  TCO2 39 30.3 31.3 30.7  O2SAT 100.0 96.4 97.9 98.2    CBC  Recent Labs Lab 11/17/13 0230 11/18/13 0241 11/19/13 0346  HGB 10.3* 10.0* 10.7*  HCT 35.0* 33.7* 35.9*  WBC 12.6* 14.5* 21.3*  PLT 283 337 308    COAGULATION  Recent Labs Lab 11/13/13 1700  INR 0.98    CARDIAC    Recent Labs Lab 11/13/13 1653 11/13/13 2358 11/14/13 0321  TROPONINI <0.30 <0.30 <0.30    Recent Labs Lab 11/13/13 1653 11/16/13 0210 11/18/13 0241 11/19/13 0346  PROBNP 215.0* 34.5 388.9* 227.7*     CHEMISTRY  Recent Labs Lab 11/15/13 0523 11/16/13 0210 11/17/13 0230 11/18/13 0241 11/19/13 0346  NA 145 145 146 147 151*  K 4.6 4.6 5.1 4.8 4.0  CL 105 104 103 101 103  CO2 29 30 32 37* 40*  GLUCOSE 230* 179* 252* 243* 138*  BUN 20 27* 32* 35* 44*  CREATININE 0.68 0.64 0.58 0.61 0.59  CALCIUM 8.0* 8.3* 9.1 9.0 9.4  MG 2.0 2.2 2.4 2.4  --   PHOS 1.4* 1.6* 2.3 3.2  --    Estimated Creatinine Clearance: 101.5 ml/min (by C-G formula based on Cr of 0.59).   LIVER  Recent Labs Lab 11/13/13 1700  AST 41*  ALT 35  ALKPHOS 115   BILITOT <0.2*  PROT 8.4*  ALBUMIN 3.1*  INR 0.98     INFECTIOUS  Recent Labs Lab 11/13/13 2358 11/15/13 0523 11/16/13 0210  LATICACIDVEN 4.2* 2.6* 1.8  PROCALCITON 1.77  --   --     ENDOCRINE CBG (last 3)   Recent Labs  11/18/13 2339 11/19/13 0337 11/19/13 0738  GLUCAP 162* 110* 170*    IMAGING x48h Dg Chest Port 1 View  11/19/2013   CLINICAL DATA:  Endotracheal tube  EXAM: PORTABLE CHEST - 1 VIEW  COMPARISON:  Chest x-ray from yesterday  FINDINGS: Endotracheal tube ends between the clavicular heads and carina. There is a left upper extremity PICC, tip at the upper right atrium. Orogastric tube at least reaches the diaphragm, below which there is non visualization.  Normal heart size and mediastinal contours. Mild interstitial coarsening at the bases, with unchanged streaky left basilar opacity. No visible pneumothorax. No pulmonary edema.  IMPRESSION: 1. Unremarkable positioning of tubes and central line. 2. Persistent opacity at the left base which could represent atelectasis or pneumonia.   Electronically Signed   By: Tiburcio Pea M.D.   On: 11/19/2013 06:47   Dg Chest Port 1 View  11/18/2013   CLINICAL DATA:  Intubated.  EXAM: PORTABLE CHEST - 1 VIEW  COMPARISON:  11/17/2013  FINDINGS: Endotracheal tube remains in place with tip well above the carina. Enteric tube courses towards the left upper abdomen with tip not imaged. Left PICC remains in place with tip projecting over the lower SVC. Cardiomediastinal silhouette is unchanged. There is mild, streaky opacity in the left lung base, stable to slightly increased from prior. Right lung remains grossly clear. No pleural effusion or pneumothorax is identified.  IMPRESSION: Streaky left basilar opacity, which may represent atelectasis versus developing infectious infiltrate.   Electronically Signed   By: Sebastian Ache   On: 11/18/2013 08:36   Dg Chest Port 1 View  11/17/2013   CLINICAL DATA:  PICC line placement  EXAM:  PORTABLE CHEST - 1 VIEW  COMPARISON:  11/17/2013 at 5:32 a.m.  FINDINGS: New left PICC has its tip in the lower superior vena cava, well positioned.  Endotracheal tube and nasogastric tube are stable in well positioned.  No acute findings in the lungs. Cardiac silhouette is normal in size. Normal mediastinal and hilar contours.  IMPRESSION: New left PICC is well positioned with its tip in the lower superior vena cava. No other change from the prior study.   Electronically Signed   By: Amie Portland M.D.   On: 11/17/2013 13:20    ASSESSMENT / PLAN:  PULMONARY OETT 8/11 >> A: Acute hypercapnic respiratory failure most likely due to acute exacerbation of COPD from RSV B infection (pnuemonitis?) 8/16> - failed wean this am with desats and increased WOB , CXR w/ streaky left basilar opacity  Remains positive bal despite diuresis yest  P:   Wean attempt PS  as high as 12 failed Will have neg balance and repeat attempts likely her underlying lung dz driving and now immobility, consider trach  pcxr in am  Last rest abg reviewed, consider slight rate 18 consider IV steroids re increase for RSV if continues to failvweaning  CARDIOVASCULAR A: Diastolic CHF component? P:  Lasix to neg baalnce tele  RENAL A:  pulm edema component today? Hypernatremia mild  P:   Chem in am  Lasix 40 iv bid Free water  GASTROINTESTINAL A:  Nutrition  Morbid obesity  P:   OG tube Tube feedings to goal Pepcid for stress ulcer prophylaxis BM noted  HEMATOLOGIC A:  Anemia, hemoconcetration? 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  Sputum culture 8/11 >> resp virus panel 8/12>> RSV B  PLAN Abx: Ceftriaxone 8/11,  -> change to ancef due to fracture/or needs 11/14/13 >>11/15/13 (not going to OR), Levaquin for AECOPD >> (total 5 days) Monitor off abx If declines, consider ribavirin May need higher steroids   ENDOCRINE A:  Hyperglycemia, no history  of DM2 8/15 >BS tr up  P:   SSI  protocol Lantus remain , in nice range  NEUROLOGIC A:  Acute encephalopathy> sounds classic for hypercapnea 8/16 >Intermittently agitated, worse overnight w/ ativan given -more sedation   P:   RASS goal: 0 Fentanyl gtt to prn  precedex gtt, with wua Daily WUA  Ortho A: Bilateral LE fractures and Compound R tib-fib fracture P:  Conservative Rx only per Dr Mayra Reel of [facility] due to very high risk for local infection and  No benefit in ultimate ability to weight [facility] in 2 weeks from 11/14/13 [facility] or PCCM will need to make this appt)   TODAY'S SUMMARY: consider trach, start lasix, failed weaning attempts  Ccm time 30 min   Mcarthur Rossetti. Tyson Alias, MD, FACP Pgr: [phone] [facility]

## 2013-11-20 ENCOUNTER — Inpatient Hospital Stay (HOSPITAL_COMMUNITY): Payer: Medicaid Other

## 2013-11-20 LAB — GLUCOSE, CAPILLARY
GLUCOSE-CAPILLARY: 114 mg/dL — AB (ref 70–99)
GLUCOSE-CAPILLARY: 137 mg/dL — AB (ref 70–99)
GLUCOSE-CAPILLARY: 220 mg/dL — AB (ref 70–99)
GLUCOSE-CAPILLARY: 254 mg/dL — AB (ref 70–99)
Glucose-Capillary: 205 mg/dL — ABNORMAL HIGH (ref 70–99)
Glucose-Capillary: 185 mg/dL — ABNORMAL HIGH (ref 70–99)

## 2013-11-20 LAB — CBC WITH DIFFERENTIAL/PLATELET
Basophils Absolute: 0 10*3/uL (ref 0.0–0.1)
Basophils Relative: 0 % (ref 0–1)
EOS ABS: 0 10*3/uL (ref 0.0–0.7)
Eosinophils Relative: 0 % (ref 0–5)
HCT: 34.1 % — ABNORMAL LOW (ref 36.0–46.0)
Hemoglobin: 10.2 g/dL — ABNORMAL LOW (ref 12.0–15.0)
Lymphocytes Relative: 11 % — ABNORMAL LOW (ref 12–46)
Lymphs Abs: 3.3 10*3/uL (ref 0.7–4.0)
MCH: 30.5 pg (ref 26.0–34.0)
MCHC: 29.9 g/dL — AB (ref 30.0–36.0)
MCV: 102.1 fL — ABNORMAL HIGH (ref 78.0–100.0)
Monocytes Absolute: 2.1 10*3/uL — ABNORMAL HIGH (ref 0.1–1.0)
Monocytes Relative: 7 % (ref 3–12)
NEUTROS PCT: 82 % — AB (ref 43–77)
Neutro Abs: 24.3 10*3/uL — ABNORMAL HIGH (ref 1.7–7.7)
Platelets: 308 10*3/uL (ref 150–400)
RBC: 3.34 MIL/uL — ABNORMAL LOW (ref 3.87–5.11)
RDW: 14.9 % (ref 11.5–15.5)
WBC: 29.7 10*3/uL — ABNORMAL HIGH (ref 4.0–10.5)

## 2013-11-20 LAB — BASIC METABOLIC PANEL
Anion gap: 9 (ref 5–15)
BUN: 41 mg/dL — ABNORMAL HIGH (ref 6–23)
CO2: 43 mEq/L (ref 19–32)
Calcium: 9.3 mg/dL (ref 8.4–10.5)
Chloride: 100 mEq/L (ref 96–112)
Creatinine, Ser: 0.55 mg/dL (ref 0.50–1.10)
GFR calc Af Amer: >90 mL/min (ref >90)
GLUCOSE: 198 mg/dL — AB (ref 70–99)
POTASSIUM: 3.7 meq/L (ref 3.7–5.3)
SODIUM: 152 meq/L — AB (ref 137–147)

## 2013-11-20 LAB — MAGNESIUM: MAGNESIUM: 2.1 mg/dL (ref 1.5–2.5)

## 2013-11-20 LAB — PHOSPHORUS: PHOSPHORUS: 4.3 mg/dL (ref 2.3–4.6)

## 2013-11-20 MED ORDER — DEXTROSE 5 % IV SOLN
INTRAVENOUS | Status: DC
Start: 2013-11-20 — End: 2013-11-24
  Administered 2013-11-20: 11:00:00 via INTRAVENOUS

## 2013-11-20 MED ORDER — METHYLPREDNISOLONE SODIUM SUCC 40 MG IJ SOLR
40.0000 mg | INTRAMUSCULAR | Status: DC
  Administered 2013-11-20: 40 mg via INTRAVENOUS
  Filled 2013-11-20 (×2): qty 1

## 2013-11-20 MED ORDER — POTASSIUM CHLORIDE 20 MEQ/15ML (10%) PO LIQD
20.0000 meq | ORAL | Status: AC
  Administered 2013-11-20 (×2): 20 meq
  Filled 2013-11-20 (×2): qty 15

## 2013-11-20 MED ORDER — FREE WATER
300.0000 mL | Status: DC
Start: 2013-11-20 — End: 2013-11-22
  Administered 2013-11-20 – 2013-11-22 (×11): 300 mL

## 2013-11-20 MED ORDER — FUROSEMIDE 10 MG/ML IJ SOLN
40.0000 mg | Freq: Four times a day (QID) | INTRAMUSCULAR | Status: DC
  Administered 2013-11-20 – 2013-11-21 (×4): 40 mg via INTRAVENOUS
  Filled 2013-11-20 (×7): qty 4

## 2013-11-20 MED ORDER — FUROSEMIDE 10 MG/ML IJ SOLN: 20.0000 mg | Freq: Two times a day (BID) | INTRAMUSCULAR | Status: DC

## 2013-11-20 MED ORDER — FREE WATER
200.0000 mL | Status: DC
  Administered 2013-11-20: 200 mL

## 2013-11-20 NOTE — Progress Notes (Signed)
[facility] FOR AM LAB REPLACEMENT ONLY  The patient does [facility] Protocol based on the criteria listed below:   1. Is GFR >/= 40 ml/min? Yes.    Patient's GFR today is >90 2. Is urine output >/= 0.5 ml/kg/hr for the last 6 hours? Yes.   Patient's UOP is 1.11 ml/kg/hr 3. Is BUN < 60 mg/dL? Yes.    Patient's BUN today is 41 4. Abnormal electrolyte(s) K 3.7 5. Ordered repletion with:per protocol 6. If a panic level lab has been reported, has the CCM MD in charge been notified? Yes.  .   Physician:  E Deterding  Ardelle ParkSuits, Mieko Kneebone McEachran 11/20/2013 5:22 AM

## 2013-11-20 NOTE — Progress Notes (Signed)
PULMONARY / CRITICAL CARE MEDICINE   Name: [name] MRN: [id number] DOB: January 04, 1951    ADMISSION DATE:  11/13/2013 CONSULTATION DATE:  11/13/2013  REFERRING MD :  Loretha StaplerWofford, EDP  CHIEF COMPLAINT:  Dyspnea, trauma  INITIAL PRESENTATION:  63 y/o female folllowed by Dr Shelle Ironlance [facility]11 with acute hypercapnic respiratory failure and bilateral lower extremity fractures.  Being managed for resp failure, poor weaning, agitation.   has a past medical history of Asthma; Obesity hypoventilation syndrome; OSA (obstructive sleep apnea); Diastolic CHF; Physical deconditioning; and HLD (hyperlipidemia).   has past surgical history that includes Cesarean section.   EVENTS/STUDIES 8/11 CT C/A/P > nonspecific infiltrate LUL, Emphysema, hepatic steatosis, porcelain gallbladder, diverticulosis 8/11 CT head/c-spine> NAICP, but extensive opacification of paranasal sinuses, C spint multifocal osteoarthritic change, no fracture 8/11 bilateral ankle films > R tib-fib fracture, left distal fibula fracture and medial malleolus 11/14/13: Normal WUA. Denies tenderness of c spine. OFf levophed. Going to OR possibly for fracture surgery 11/15/13: Failed SBT but was on sedation at this time. Ortho prefer conservative approach due to high risk local infection status.  8/13 Echo 8/12 with mixture of chronic systolic and diastolic dysfn (ef 45% with grade 1 diast dysfn)  SUBJECTIVE/OVERNIGHT/INTERVAL HX: weaning to PS 5 today  VITAL SIGNS: Temp:  [97.4 F (36.3 C)-98.5 F (36.9 C)] 97.9 F (36.6 C) (08/18 0758) Pulse Rate:  [42-120] 116 (08/18 1000) Resp:  [14-27] 25 (08/18 1000) BP: (87-147)/(51-86) 129/63 mmHg (08/18 1000) SpO2:  [95 %-100 %] 95 % (08/18 1000) FiO2 (%):  [40 %-50 %] 40 % (08/18 0800) Weight:  [126.6 kg (279 lb 1.6 oz)] 126.6 kg (279 lb 1.6 oz) (08/18 0439) HEMODYNAMICS:   VENTILATOR SETTINGS: Vent Mode:  [-] PSV;CPAP FiO2 (%):  [40 %-50 %] 40 % Set Rate:  [18  bmp] 18 bmp Vt Set:  [503 mL-510 mL] 510 mL PEEP:  [5 cmH20] 5 cmH20 Pressure Support:  [5 cmH20] 5 cmH20 Plateau Pressure:  [23 cmH20] 23 cmH20 INTAKE / OUTPUT:  Intake/Output Summary (Last 24 hours) at 11/20/13 1021 Last data filed at 11/20/13 0600  Gross per 24 hour  Intake 2109.9 ml  Output   2305 ml  Net -195.1 ml    PHYSICAL EXAMINATION: Gen:  Critically ill looking, morbidly obese  HEENT: intubated, jvd PULM: very reduced, no ronchi, mild exp wheezing CV:  s1 s 2 regular, no mgr Ab: BS+, soft, nontender, morbdily obese  Ext: warm, bilateral splints in place Derm: diffuse dryness, with patches of dry skin  Neuro : follows commands, more lethargic  LABS: PULMONARY  Recent Labs Lab 11/13/13 1754 11/13/13 2209 11/14/13 0429 11/15/13 0449  PHART 7.291* 7.285* 7.284* 7.301*  PCO2ART 75.6* 61.7* 63.9* 60.3*  PO2ART 377.0* 86.2 112.0* 115.0*  HCO3 36.4* 28.4* 29.3* 28.8*  TCO2 39 30.3 31.3 30.7  O2SAT 100.0 96.4 97.9 98.2    CBC  Recent Labs Lab 11/18/13 0241 11/19/13 0346 11/20/13 0350  HGB 10.0* 10.7* 10.2*  HCT 33.7* 35.9* 34.1*  WBC 14.5* 21.3* 29.7*  PLT 337 308 308    COAGULATION  Recent Labs Lab 11/13/13 1700  INR 0.98    CARDIAC    Recent Labs Lab 11/13/13 1653 11/13/13 2358 11/14/13 0321  TROPONINI <0.30 <0.30 <0.30    Recent Labs Lab 11/13/13 1653 11/16/13 0210 11/18/13 0241 11/19/13 0346  PROBNP 215.0* 34.5 388.9* 227.7*     CHEMISTRY  Recent Labs Lab 11/15/13 0523 11/16/13 0210 11/17/13 0230 11/18/13  0241 11/19/13 0346 11/20/13 0350  NA 145 145 146 147 151* 152*  K 4.6 4.6 5.1 4.8 4.0 3.7  CL 105 104 103 101 103 100  CO2 29 30 32 37* 40* 43*  GLUCOSE 230* 179* 252* 243* 138* 198*  BUN 20 27* 32* 35* 44* 41*  CREATININE 0.68 0.64 0.58 0.61 0.59 0.55  CALCIUM 8.0* 8.3* 9.1 9.0 9.4 9.3  MG 2.0 2.2 2.4 2.4  --  2.1  PHOS 1.4* 1.6* 2.3 3.2  --  4.3   Estimated Creatinine Clearance: 99.8 ml/min (by C-G  formula based on Cr of 0.55).   LIVER  Recent Labs Lab 11/13/13 1700  AST 41*  ALT 35  ALKPHOS 115  BILITOT <0.2*  PROT 8.4*  ALBUMIN 3.1*  INR 0.98     INFECTIOUS  Recent Labs Lab 11/13/13 2358 11/15/13 0523 11/16/13 0210  LATICACIDVEN 4.2* 2.6* 1.8  PROCALCITON 1.77  --   --     ENDOCRINE CBG (last 3)   Recent Labs  11/19/13 2346 11/20/13 0413 11/20/13 0747  GLUCAP 114* 137* 205*    IMAGING x48h Dg Chest Port 1 View  11/20/2013   CLINICAL DATA:  Reassess edema  EXAM: PORTABLE CHEST - 1 VIEW  COMPARISON:  Portable chest x-ray of November 19, 2013  FINDINGS: The lungs are well-expanded. There is no alveolar infiltrate. The interstitial markings are mildly increased especially in the left infrahilar region. This is stable. The heart and pulmonary vascularity are within the limits of normal. No significant pleural effusion is demonstrated.  The endotracheal tube tip lies 4.8 cm above the crotch of the carina. The esophagogastric tube tip projects below the inferior margin of the image. A left-sided PICC line tip lies in the distal portion of the SVC.  IMPRESSION: There is no evidence of pneumonia nor signal significant pulmonary edema. Subsegmental left lower lobe atelectasis is suspected. The support tubes and lines are in appropriate position.   Electronically Signed   By: David  [place]   On: 11/20/2013 07:39   Dg Chest Port 1 View  11/19/2013   CLINICAL DATA:  Endotracheal tube  EXAM: PORTABLE CHEST - 1 VIEW  COMPARISON:  Chest x-ray from yesterday  FINDINGS: Endotracheal tube ends between the clavicular heads and carina. There is a left upper extremity PICC, tip at the upper right atrium. Orogastric tube at least reaches the diaphragm, below which there is non visualization.  Normal heart size and mediastinal contours. Mild interstitial coarsening at the bases, with unchanged streaky left basilar opacity. No visible pneumothorax. No pulmonary edema.  IMPRESSION: 1.  Unremarkable positioning of tubes and central line. 2. Persistent opacity at the left base which could represent atelectasis or pneumonia.   Electronically Signed   By: Tiburcio Pea M.D.   On: 11/19/2013 06:47    ASSESSMENT / PLAN:  PULMONARY OETT 8/11 >> A: Acute hypercapnic respiratory failure most likely due to acute exacerbation of COPD from RSV B infection (pnuemonitis?) 8/16> - failed wean this am with desats and increased WOB , CXR w/ streaky left basilar opacity  Remains positive bal despite diuresis yest  P:   Wean attempt PS again this am PS 5 finally tolerating, goal 2 hrs, then abg May be able to extubate to NIMV likely her underlying lung dz driving and now immobility, consider trach if not improved with weaning Maintain steroids at current dose back to IV BDers  CARDIOVASCULAR A: Diastolic CHF component? 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  Sputum culture 8/11 >> resp virus panel 8/12>> RSV B  PLAN Abx: Ceftriaxone 8/11,  -> change to ancef due to fracture/or needs 11/14/13 >>11/15/13 (not going to OR), Levaquin for AECOPD >> (total 5 days) Monitor off abx If declines, consider ribavirin Tx Follow wbc  ENDOCRINE A:  Hyperglycemia, no history of DM2 8/15 >BS tr up  P:   SSI  protocol Lantus remain , in nice range  NEUROLOGIC A:  Acute encephalopathy> sounds classic for hypercapnea 8/16 >Intermittently agitated, worse overnight w/ ativan given -more sedation   P:   RASS goal: 0 Fentanyl gtt to prn wua  Ortho A: Bilateral LE fractures and Compound R tib-fib fracture P:   Conservative Rx only per Dr Mayra Reel of [facility] due to very high risk for local infection and  No benefit in ultimate ability to weight [facility] in 2 weeks from 11/14/13 [facility] or PCCM will need to make this appt)   TODAY'S SUMMARY:  Weaning to PS 5, family meeting today  Ccm time 30 min   Mcarthur Rossetti. Tyson Alias, MD, FACP Pgr: [phone] [facility]

## 2013-11-21 ENCOUNTER — Encounter (HOSPITAL_COMMUNITY): Payer: Medicaid Other

## 2013-11-21 ENCOUNTER — Inpatient Hospital Stay (HOSPITAL_COMMUNITY): Payer: Medicaid Other

## 2013-11-21 LAB — GLUCOSE, CAPILLARY
GLUCOSE-CAPILLARY: 95 mg/dL (ref 70–99)
GLUCOSE-CAPILLARY: 170 mg/dL — AB (ref 70–99)
Glucose-Capillary: 126 mg/dL — ABNORMAL HIGH (ref 70–99)
Glucose-Capillary: 163 mg/dL — ABNORMAL HIGH (ref 70–99)
Glucose-Capillary: 220 mg/dL — ABNORMAL HIGH (ref 70–99)
Glucose-Capillary: 238 mg/dL — ABNORMAL HIGH (ref 70–99)
Glucose-Capillary: 48 mg/dL — ABNORMAL LOW (ref 70–99)

## 2013-11-21 LAB — BLOOD GAS, ARTERIAL
Acid-Base Excess: 24 mmol/L — ABNORMAL HIGH (ref 0.0–2.0)
BICARBONATE: 51.6 meq/L — AB (ref 20.0–24.0)
Drawn by: 33100
FIO2: 0.4 %
O2 Saturation: 90.6 %
PEEP: 5 cmH2O
PH ART: 7.303 — AB (ref 7.350–7.450)
PO2 ART: 69.4 mmHg — AB (ref 80.0–100.0)
PRESSURE SUPPORT: 5 cmH2O
Patient temperature: 98.6
TCO2: 54.9 mmol/L (ref 0–100)
pCO2 arterial: 107 mmHg (ref 35.0–45.0)

## 2013-11-21 LAB — BASIC METABOLIC PANEL
BUN: 39 mg/dL — AB (ref 6–23)
CHLORIDE: 92 meq/L — AB (ref 96–112)
Calcium: 9.3 mg/dL (ref 8.4–10.5)
Creatinine, Ser: 0.58 mg/dL (ref 0.50–1.10)
GFR calc non Af Amer: >90 mL/min (ref >90)
Glucose, Bld: 143 mg/dL — ABNORMAL HIGH (ref 70–99)
POTASSIUM: 4 meq/L (ref 3.7–5.3)
Sodium: 145 mEq/L (ref 137–147)

## 2013-11-21 MED ORDER — FUROSEMIDE 10 MG/ML IJ SOLN: 40.0000 mg | Freq: Four times a day (QID) | INTRAMUSCULAR | Status: DC

## 2013-11-21 MED ORDER — FUROSEMIDE 10 MG/ML IJ SOLN
60.0000 mg | Freq: Four times a day (QID) | INTRAMUSCULAR | Status: DC
  Administered 2013-11-21 – 2013-11-22 (×4): 60 mg via INTRAVENOUS
  Filled 2013-11-21 (×7): qty 6

## 2013-11-21 MED ORDER — METHYLPREDNISOLONE SODIUM SUCC 40 MG IJ SOLR
40.0000 mg | INTRAMUSCULAR | Status: DC
  Administered 2013-11-21: 40 mg via INTRAVENOUS
  Filled 2013-11-21 (×2): qty 1

## 2013-11-21 NOTE — Progress Notes (Signed)
NUTRITION FOLLOW UP  Intervention:   Continue Vital HP @ 65 ml/hr via OGT. Tube feeding regimen provides 1560 kcal (70% of needs), 137 grams of protein, and 1304 ml of H2O.   Nutrition Dx:   Inadequate oral intake related to inability to eat as evidenced by NPO status.   Goal:   Enteral nutrition to provide 60-70% of estimated calorie needs (22-25 kcals/kg ideal body weight) and 100% of estimated protein needs, based on ASPEN guidelines for permissive underfeeding in critically ill obese individuals  Monitor:   TF tolerance/adequacy, weight trend, labs, vent status.  Assessment:   63 y/o female with severe COPD and obesity hypoventilation syndrome was [facility] 8[facility]. She had severe hypercapnic respiratory failure requiring mechanical ventilation. Just prior to admission she fell breaking both ankles.  - Spoke with RD who said pt is tolerating TF well with no residuals. - Pt currently undergoing weaning trials.  - Considering trach.  Patient is currently intubated on ventilator support MV: 7.6 L/min Temp (24hrs), Avg:98.2 F (36.8 C), Min:97 F (36.1 C), Max:98.7 F (37.1 C)  Height: Ht Readings from Last 1 Encounters:  11/13/13 5\' 8"  (1.727 m)    Weight Status:   Wt Readings from Last 1 Encounters:  11/21/13 280 lb 10.3 oz (127.3 kg)    Re-estimated needs:  Kcal: 1883 Protein: 127-159 g Fluid: Per MD  Skin: dry, scaly skin on back  Diet Order:     Intake/Output Summary (Last 24 hours) at 11/21/13 0953 Last data filed at 11/21/13 0930  Gross per 24 hour  Intake 3608.33 ml  Output   3600 ml  Net   8.33 ml    Last BM: 8/18   Labs:   Recent Labs Lab 11/17/13 0230 11/18/13 0241 11/19/13 0346 11/20/13 0350 11/21/13 0500  NA 146 147 151* 152* 145  K 5.1 4.8 4.0 3.7 4.0  CL 103 101 103 100 92*  CO2 32 37* 40* 43* >45*  BUN 32* 35* 44* 41* 39*  CREATININE 0.58 0.61 0.59 0.55 0.58  CALCIUM 9.1 9.0 9.4 9.3 9.3  MG 2.4 2.4  --  2.1  --    PHOS 2.3 3.2  --  4.3  --   GLUCOSE 252* 243* 138* 198* 143*    CBG (last 3)   Recent Labs  11/20/13 2353 11/21/13 0408 11/21/13 0751  GLUCAP 163* 95 126*    Scheduled Meds: . antiseptic oral rinse  7 mL Mouth Rinse QID  . budesonide  0.5 mg Nebulization BID  . chlorhexidine  15 mL Mouth Rinse BID  . enoxaparin (LOVENOX) injection  60 mg Subcutaneous Q24H  . famotidine  20 mg Per Tube Q12H  . feeding supplement (VITAL HIGH PROTEIN)  1,000 mL Per Tube Q24H  . free water  300 mL Per Tube Q4H  . furosemide  40 mg Intravenous Q6H  . insulin aspart  0-20 Units Subcutaneous 6 times per day  . insulin glargine  20 Units Subcutaneous QHS  . ipratropium-albuterol  3 mL Nebulization Q6H  . methylPREDNISolone (SOLU-MEDROL) injection  40 mg Intravenous Q24H  . sodium chloride  10-40 mL Intracatheter Q12H    Continuous Infusions: . dextrose 50 mL/hr at 11/20/13 1126    Ebbie LatusHaley Hawkins RD, LDN

## 2013-11-21 NOTE — Consult Note (Addendum)
WOC wound consult note Reason for Consult: Consult requested for lower back with dry scaly skin.  Pt has crusted skin of unknown etiology.  No open wound or drainage to this site.  Pt is intubated and unable to provide any information regarding if she has seen a dermatologist or uses prescription cream to the affected area.  Recommend follow-up with a dermatologist after discharge for further plan of care. Wound type:Buttocks with moisture acquired skin damage to buttocks.  Red and moist with partial thickness skin loss, pt frequently incontinent and has Flexiseal to assist with controlling stool.  On Sport low air loss bed to reduce pressure and barrier cream to protect skin PRN.  Groin: Skin folds with intertrigo, red, moist and macerated. Interdry to folds bilaterally; this provides antimicrobial benefits and wicks moisture away from skin.  Optimal plan of care is to leave in place of 5 days to promote healing. Charlesetta GaribaldiJody Harris, RN, BSN, MSN-Student Please re-consult if further assistance is needed.  Thank-you,  Cammie Mcgeeawn Walter Grima MSN, RN, CWOCN, [place]CN-AP, CNS [phone]

## 2013-11-21 NOTE — Progress Notes (Signed)
CRITICAL VALUE ALERT  Critical value received:  co2  >45  Date of notification:  11/21/13  Time of notification:  0556  Critical value read back: yes  Nurse who received alert:  km  Responding MD:  elink  Time MD responded:  [phone]

## 2013-11-21 NOTE — Progress Notes (Signed)
PULMONARY / CRITICAL CARE MEDICINE   Name: NATAJAH DERDERIAN MRN: [id number] DOB: 1950-06-27    ADMISSION DATE:  11/13/2013 CONSULTATION DATE:  11/13/2013  REFERRING MD :  [name], EDP  CHIEF COMPLAINT:  Dyspnea, trauma  INITIAL PRESENTATION:  63 y/o female folllowed by Dr Shelle Iron admitted from the [facility] ED on 8/11 with acute hypercapnic respiratory failure and bilateral lower extremity fractures.  Being managed for resp failure, poor weaning, agitation.   has a past medical history of Asthma; Obesity hypoventilation syndrome; OSA (obstructive sleep apnea); Diastolic CHF; Physical deconditioning; and HLD (hyperlipidemia).   has past surgical history that includes Cesarean section.   EVENTS/STUDIES 8/11 CT C/A/P > nonspecific infiltrate LUL, Emphysema, hepatic steatosis, porcelain gallbladder, diverticulosis 8/11 CT head/c-spine> NAICP, but extensive opacification of paranasal sinuses, C spint multifocal osteoarthritic change, no fracture 8/11 bilateral ankle films > R tib-fib fracture, left distal fibula fracture and medial malleolus 11/14/13: Normal WUA. Denies tenderness of c spine. OFf levophed. Going to OR possibly for fracture surgery 11/15/13: Failed SBT but was on sedation at this time. Ortho prefer conservative approach due to high risk local infection status.  8/13 Echo 8/12 with mixture of chronic systolic and diastolic dysfn (ef 45% with grade 1 diast dysfn) 8/18- family meeting, full code, trach wishes if needed  SUBJECTIVE/OVERNIGHT/INTERVAL HX: Weaned, some aggitation  VITAL SIGNS: Temp:  [97 F (36.1 C)-98.7 F (37.1 C)] 98.3 F (36.8 C) (08/19 0700) Pulse Rate:  [95-116] 106 (08/19 0600) Resp:  [17-22] 19 (08/19 0700) BP: (78-158)/(54-103) 126/82 mmHg (08/19 0700) SpO2:  [97 %-100 %] 98 % (08/19 0600) FiO2 (%):  [40 %-50 %] 40 % (08/19 0919) Weight:  [127.3 kg (280 lb 10.3 oz)] 127.3 kg (280 lb 10.3 oz) (08/19 0500) HEMODYNAMICS:   VENTILATOR SETTINGS: Vent Mode:   [-] CPAP;PSV FiO2 (%):  [40 %-50 %] 40 % Set Rate:  [18 bmp] 18 bmp Vt Set:  [510 mL] 510 mL PEEP:  [5 cmH20] 5 cmH20 Pressure Support:  [5 cmH20] 5 cmH20 Plateau Pressure:  [23 cmH20-29 cmH20] 23 cmH20 INTAKE / OUTPUT:  Intake/Output Summary (Last 24 hours) at 11/21/13 1036 Last data filed at 11/21/13 0930  Gross per 24 hour  Intake 3323.33 ml  Output   3400 ml  Net -76.67 ml    PHYSICAL EXAMINATION: Gen:  Critically ill looking, morbidly obese  HEENT: jvd PULM: mild exp wheezing CV:  s1 s 2 regular, no mgr, tachy slight Ab: BS+, soft, nontender, morbdily obese  Ext: warm, bilateral splints in place Derm: diffuse dryness, with patches of dry skin, on back also  Neuro : follows commands, moves all ext equal  LABS: PULMONARY  Recent Labs Lab 11/15/13 0449  PHART 7.301*  PCO2ART 60.3*  PO2ART 115.0*  HCO3 28.8*  TCO2 30.7  O2SAT 98.2    CBC  Recent Labs Lab 11/18/13 0241 11/19/13 0346 11/20/13 0350  HGB 10.0* 10.7* 10.2*  HCT 33.7* 35.9* 34.1*  WBC 14.5* 21.3* 29.7*  PLT 337 308 308    COAGULATION No results found for this basename: INR,  in the last 168 hours  CARDIAC   No results found for this basename: TROPONINI,  in the last 168 hours  Recent Labs Lab 11/16/13 0210 11/18/13 0241 11/19/13 0346  PROBNP 34.5 388.9* 227.7*     CHEMISTRY  Recent Labs Lab 11/15/13 0523 11/16/13 0210 11/17/13 0230 11/18/13 0241 11/19/13 0346 11/20/13 0350 11/21/13 0500  NA 145 145 146 147 151* 152* 145  K 4.6 4.6  5.1 4.8 4.0 3.7 4.0  CL 105 104 103 101 103 100 92*  CO2 29 30 32 37* 40* 43* >45*  GLUCOSE 230* 179* 252* 243* 138* 198* 143*  BUN 20 27* 32* 35* 44* 41* 39*  CREATININE 0.68 0.64 0.58 0.61 0.59 0.55 0.58  CALCIUM 8.0* 8.3* 9.1 9.0 9.4 9.3 9.3  MG 2.0 2.2 2.4 2.4  --  2.1  --   PHOS 1.4* 1.6* 2.3 3.2  --  4.3  --    Estimated Creatinine Clearance: 100.2 ml/min (by C-G formula based on Cr of 0.58).   LIVER No results found for this  basename: AST, ALT, ALKPHOS, BILITOT, PROT, ALBUMIN, INR,  in the last 168 hours   INFECTIOUS  Recent Labs Lab 11/15/13 0523 11/16/13 0210  LATICACIDVEN 2.6* 1.8    ENDOCRINE CBG (last 3)   Recent Labs  11/20/13 2353 11/21/13 0408 11/21/13 0751  GLUCAP 163* 95 126*    IMAGING x48h Dg Chest Port 1 View  11/21/2013   CLINICAL DATA:  Assess endotracheal tube.  EXAM: PORTABLE CHEST - 1 VIEW  COMPARISON:  Chest x-ray from yesterday.  FINDINGS: Endotracheal tube ends between the clavicular heads and carina, stable from yesterday. Left upper extremity PICC tip projects at the level of the upper right atrium. The catheter positioning is similar to yesterday when accounting for differences in patient positioning. Orogastric tube at least reaches the diaphragm, beyond which there is nonvisualization.  Streaky lower lung opacities, left more than right. The lateral left costophrenic sulcus is excluded. Given semi-erect state, a pneumothorax should not be missed based on this limitation. No evidence of pleural effusion, excluding the left base.  IMPRESSION: 1. Tubes and central line remain in acceptable position. 2. Bibasilar opacities which could reflect atelectasis or bronchopneumonia.   Electronically Signed   By: Tiburcio PeaJonathan  Watts M.D.   On: 11/21/2013 06:46   Dg Chest Port 1 View  11/20/2013   CLINICAL DATA:  Reassess edema  EXAM: PORTABLE CHEST - 1 VIEW  COMPARISON:  Portable chest x-ray of November 19, 2013  FINDINGS: The lungs are well-expanded. There is no alveolar infiltrate. The interstitial markings are mildly increased especially in the left infrahilar region. This is stable. The heart and pulmonary vascularity are within the limits of normal. No significant pleural effusion is demonstrated.  The endotracheal tube tip lies 4.8 cm above the crotch of the carina. The esophagogastric tube tip projects below the inferior margin of the image. A left-sided PICC line tip lies in the distal portion  of the SVC.  IMPRESSION: There is no evidence of pneumonia nor signal significant pulmonary edema. Subsegmental left lower lobe atelectasis is suspected. The support tubes and lines are in appropriate position.   Electronically Signed   By: David  [place]   On: 11/20/2013 07:39    ASSESSMENT / PLAN:  PULMONARY OETT 8/11 >> A: Acute hypercapnic respiratory failure most likely due to acute exacerbation of COPD from RSV B infection (pnuemonitis?) 8/16> - failed wean this am with desats and increased WOB , CXR w/ streaky left basilar opacity  Remains positive bal despite diuresis yest  P:   Wean attempt PS again this am PS 5 finally tolerating, assess rsbi, abg needed at 1 hr May be a candidate for extubation to NIMV If fails or not meet criteria extubation - trach Neg balance obtained maintain Keep iv steroids BDers pcxr in am for volume status lasix  CARDIOVASCULAR A: Diastolic CHF component? 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 R/o cdiff Sputum culture 8/11 >> resp virus panel 8/12>> RSV B Increase in stool  PLAN Abx: Ceftriaxone 8/11,  -> change to ancef due to fracture/or needs 11/14/13 >>11/15/13 (not going to OR), Levaquin for AECOPD >> (total 5 days) Monitor off abx If declines, consider ribavirin Tx Follow wbc further Send cdiff No fevers reported follow  ENDOCRINE A:  Hyperglycemia, no history of DM2 8/15 >BS tr up  P:   SSI  protocol Lantus remain same dose unless NPO   NEUROLOGIC A:  Acute encephalopathy> sounds classic for  hypercapnea 8/16 >Intermittently agitated, worse overnight w/ ativan given -more sedation   P:   RASS goal: 0 Fentanyl gtt to prn wua  Ortho A: Bilateral LE fractures and Compound R tib-fib fracture P:  Conservative Rx only per Dr Mayra Reel of [facility] due to very high risk for local infection and  No benefit in ultimate ability to weight [facility] in 2 weeks from 11/14/13 [facility] or PCCM will need to make this appt)   TODAY'S SUMMARY:  Weaning to PS 5, may extubate, lasix increase  Ccm time 30 min   Mcarthur Rossetti. Tyson Alias, MD, FACP Pgr: [phone] [facility]

## 2013-11-22 LAB — CBC WITH DIFFERENTIAL/PLATELET
BASOS ABS: 0 10*3/uL (ref 0.0–0.1)
Basophils Relative: 0 % (ref 0–1)
EOS ABS: 0.3 10*3/uL (ref 0.0–0.7)
Eosinophils Relative: 1 % (ref 0–5)
HEMATOCRIT: 29.8 % — AB (ref 36.0–46.0)
Hemoglobin: 9.1 g/dL — ABNORMAL LOW (ref 12.0–15.0)
LYMPHS ABS: 5.1 10*3/uL — AB (ref 0.7–4.0)
LYMPHS PCT: 18 % (ref 12–46)
MCH: 30.8 pg (ref 26.0–34.0)
MCHC: 30.5 g/dL (ref 30.0–36.0)
MCV: 101 fL — ABNORMAL HIGH (ref 78.0–100.0)
Monocytes Absolute: 2.2 10*3/uL — ABNORMAL HIGH (ref 0.1–1.0)
Monocytes Relative: 8 % (ref 3–12)
Neutro Abs: 20.5 10*3/uL — ABNORMAL HIGH (ref 1.7–7.7)
Neutrophils Relative %: 73 % (ref 43–77)
Platelets: 336 10*3/uL (ref 150–400)
RBC: 2.95 MIL/uL — ABNORMAL LOW (ref 3.87–5.11)
RDW: 15.2 % (ref 11.5–15.5)
WBC: 28.1 10*3/uL — ABNORMAL HIGH (ref 4.0–10.5)

## 2013-11-22 LAB — GLUCOSE, CAPILLARY
GLUCOSE-CAPILLARY: 116 mg/dL — AB (ref 70–99)
GLUCOSE-CAPILLARY: 196 mg/dL — AB (ref 70–99)
GLUCOSE-CAPILLARY: 98 mg/dL (ref 70–99)
Glucose-Capillary: 154 mg/dL — ABNORMAL HIGH (ref 70–99)
Glucose-Capillary: 159 mg/dL — ABNORMAL HIGH (ref 70–99)
Glucose-Capillary: 159 mg/dL — ABNORMAL HIGH (ref 70–99)

## 2013-11-22 LAB — BASIC METABOLIC PANEL
BUN: 36 mg/dL — ABNORMAL HIGH (ref 6–23)
CHLORIDE: 85 meq/L — AB (ref 96–112)
CO2: >45 mEq/L (ref 19–32)
CREATININE: 0.61 mg/dL (ref 0.50–1.10)
Calcium: 9 mg/dL (ref 8.4–10.5)
GLUCOSE: 147 mg/dL — AB (ref 70–99)
POTASSIUM: 3.6 meq/L — AB (ref 3.7–5.3)
Sodium: 141 mEq/L (ref 137–147)

## 2013-11-22 LAB — PHOSPHORUS: Phosphorus: 2.8 mg/dL (ref 2.3–4.6)

## 2013-11-22 LAB — CLOSTRIDIUM DIFFICILE BY PCR: Toxigenic C. Difficile by PCR: NEGATIVE

## 2013-11-22 LAB — MAGNESIUM: Magnesium: 1.7 mg/dL (ref 1.5–2.5)

## 2013-11-22 MED ORDER — METHYLPREDNISOLONE SODIUM SUCC 40 MG IJ SOLR
20.0000 mg | INTRAMUSCULAR | Status: AC
  Administered 2013-11-22 – 2013-11-23 (×2): 20 mg via INTRAVENOUS
  Filled 2013-11-22 (×2): qty 0.5

## 2013-11-22 MED ORDER — POTASSIUM CHLORIDE 10 MEQ/50ML IV SOLN
10.0000 meq | Freq: Once | INTRAVENOUS | Status: AC
  Administered 2013-11-22: 10 meq via INTRAVENOUS
  Filled 2013-11-22: qty 50

## 2013-11-22 MED ORDER — BUDESONIDE 0.5 MG/2ML IN SUSP
0.5000 mg | Freq: Two times a day (BID) | RESPIRATORY_TRACT | Status: DC
  Administered 2013-11-22 – 2013-12-03 (×21): 0.5 mg via RESPIRATORY_TRACT
  Filled 2013-11-22 (×26): qty 2

## 2013-11-22 MED ORDER — POTASSIUM CHLORIDE 20 MEQ/15ML (10%) PO LIQD
20.0000 meq | ORAL | Status: DC
  Administered 2013-11-22: 20 meq
  Filled 2013-11-22: qty 15

## 2013-11-22 MED ORDER — FENTANYL 25 MCG/HR TD PT72
50.0000 ug | MEDICATED_PATCH | TRANSDERMAL | Status: DC
  Administered 2013-11-22 – 2013-11-25 (×2): 50 ug via TRANSDERMAL
  Filled 2013-11-22 (×2): qty 1

## 2013-11-22 MED ORDER — METOPROLOL TARTRATE 1 MG/ML IV SOLN
5.0000 mg | Freq: Once | INTRAVENOUS | Status: AC
  Administered 2013-11-22: 5 mg via INTRAVENOUS
  Filled 2013-11-22: qty 5

## 2013-11-22 MED ORDER — FUROSEMIDE 10 MG/ML IJ SOLN
60.0000 mg | Freq: Two times a day (BID) | INTRAMUSCULAR | Status: DC
  Administered 2013-11-22 – 2013-11-23 (×2): 60 mg via INTRAVENOUS
  Filled 2013-11-22 (×2): qty 6

## 2013-11-22 MED ORDER — MAGNESIUM SULFATE 40 MG/ML IJ SOLN
2.0000 g | Freq: Once | INTRAMUSCULAR | Status: AC
  Administered 2013-11-22: 2 g via INTRAVENOUS
  Filled 2013-11-22: qty 50

## 2013-11-22 MED ORDER — DILTIAZEM LOAD VIA INFUSION
10.0000 mg | Freq: Once | INTRAVENOUS | Status: AC
  Administered 2013-11-22: 10 mg via INTRAVENOUS
  Filled 2013-11-22: qty 10

## 2013-11-22 MED ORDER — FUROSEMIDE 10 MG/ML IJ SOLN: 60.0000 mg | Freq: Two times a day (BID) | INTRAMUSCULAR | Status: DC

## 2013-11-22 MED ORDER — DILTIAZEM HCL 100 MG IV SOLR
5.0000 mg/h | INTRAVENOUS | Status: DC
  Administered 2013-11-22: 5 mg/h via INTRAVENOUS
  Administered 2013-11-23: 10 mg/h via INTRAVENOUS
  Filled 2013-11-22 (×2): qty 100

## 2013-11-22 NOTE — Progress Notes (Signed)
PULMONARY / CRITICAL CARE MEDICINE   Name: SELENNE COGGIN MRN: [id number] DOB: 1950-06-30    ADMISSION DATE:  11/13/2013 CONSULTATION DATE:  11/13/2013  REFERRING MD :  [name], EDP  CHIEF COMPLAINT:  Dyspnea, trauma  INITIAL PRESENTATION:  63 y/o female folllowed by Dr Shelle Iron admitted from the [facility] ED on 8/11 with acute hypercapnic respiratory failure and bilateral lower extremity fractures.  Being managed for resp failure, poor weaning, agitation.   has a past medical history of Asthma; Obesity hypoventilation syndrome; OSA (obstructive sleep apnea); Diastolic CHF; Physical deconditioning; and HLD (hyperlipidemia).   has past surgical history that includes Cesarean section.   EVENTS/STUDIES 8/11 CT C/A/P > nonspecific infiltrate LUL, Emphysema, hepatic steatosis, porcelain gallbladder, diverticulosis 8/11 CT head/c-spine> NAICP, but extensive opacification of paranasal sinuses, C spint multifocal osteoarthritic change, no fracture 8/11 bilateral ankle films > R tib-fib fracture, left distal fibula fracture and medial malleolus 11/14/13: Normal WUA. Denies tenderness of c spine. OFf levophed. Going to OR possibly for fracture surgery 11/15/13: Failed SBT but was on sedation at this time. Ortho prefer conservative approach due to high risk local infection status.  8/13 Echo 8/12 with mixture of chronic systolic and diastolic dysfn (ef 45% with grade 1 diast dysfn) 8/18- family meeting, full code, trach wishes if needed  SUBJECTIVE/OVERNIGHT/INTERVAL HX: Weaned failed  VITAL SIGNS: Temp:  [96.6 F (35.9 C)-99.6 F (37.6 C)] 99.1 F (37.3 C) (08/20 0700) Pulse Rate:  [92-125] 93 (08/20 0700) Resp:  [17-29] 18 (08/20 0700) BP: (78-140)/(49-98) 140/81 mmHg (08/20 0700) SpO2:  [90 %-100 %] 99 % (08/20 0730) FiO2 (%):  [40 %] 40 % (08/20 0730) Weight:  [125.6 kg (276 lb 14.4 oz)] 125.6 kg (276 lb 14.4 oz) (08/20 0500) HEMODYNAMICS:   VENTILATOR SETTINGS: Vent Mode:  [-] PRVC FiO2  (%):  [40 %] 40 % Set Rate:  [18 bmp] 18 bmp Vt Set:  [510 mL] 510 mL PEEP:  [5 cmH20] 5 cmH20 Pressure Support:  [5 cmH20-15 cmH20] 15 cmH20 Plateau Pressure:  [22 cmH20-24 cmH20] 22 cmH20 INTAKE / OUTPUT:  Intake/Output Summary (Last 24 hours) at 11/22/13 4098 Last data filed at 11/22/13 0700  Gross per 24 hour  Intake   3870 ml  Output   5600 ml  Net  -1730 ml    PHYSICAL EXAMINATION: Gen:  Critically ill looking, morbidly obese , much more awake HEENT: jvd PULM: mild exp wheezing resolved, coarse CV:  s1 s 2 regular, no mgr Ab: BS+, soft, nontender, morbdily obese  Ext: warm, bilateral splints in place Derm: diffuse dryness, with patches of dry skin, on back also lichenification? Neuro : follows commands, moves all ext equal in a chair position  LABS: PULMONARY  Recent Labs Lab 11/21/13 1050  PHART 7.303*  PCO2ART 107.0*  PO2ART 69.4*  HCO3 51.6*  TCO2 54.9  O2SAT 90.6    CBC  Recent Labs Lab 11/19/13 0346 11/20/13 0350 11/22/13 0430  HGB 10.7* 10.2* 9.1*  HCT 35.9* 34.1* 29.8*  WBC 21.3* 29.7* 28.1*  PLT 308 308 336    COAGULATION No results found for this basename: INR,  in the last 168 hours  CARDIAC   No results found for this basename: TROPONINI,  in the last 168 hours  Recent Labs Lab 11/16/13 0210 11/18/13 0241 11/19/13 0346  PROBNP 34.5 388.9* 227.7*     CHEMISTRY  Recent Labs Lab 11/16/13 0210 11/17/13 0230 11/18/13 0241 11/19/13 0346 11/20/13 0350 11/21/13 0500 11/22/13 0430  NA 145 146 147  151* 152* 145 141  K 4.6 5.1 4.8 4.0 3.7 4.0 3.6*  CL 104 103 101 103 100 92* 85*  CO2 30 32 37* 40* 43* >45* >45*  GLUCOSE 179* 252* 243* 138* 198* 143* 147*  BUN 27* 32* 35* 44* 41* 39* 36*  CREATININE 0.64 0.58 0.61 0.59 0.55 0.58 0.61  CALCIUM 8.3* 9.1 9.0 9.4 9.3 9.3 9.0  MG 2.2 2.4 2.4  --  2.1  --  1.7  PHOS 1.6* 2.3 3.2  --  4.3  --  2.8   Estimated Creatinine Clearance: 99.4 ml/min (by C-G formula based on Cr of  0.61).   LIVER No results found for this basename: AST, ALT, ALKPHOS, BILITOT, PROT, ALBUMIN, INR,  in the last 168 hours   INFECTIOUS  Recent Labs Lab 11/16/13 0210  LATICACIDVEN 1.8    ENDOCRINE CBG (last 3)   Recent Labs  11/21/13 1856 11/21/13 2349 11/22/13 0413  GLUCAP 220* 196* 116*    IMAGING x48h Dg Chest Port 1 View  11/21/2013   CLINICAL DATA:  Assess endotracheal tube.  EXAM: PORTABLE CHEST - 1 VIEW  COMPARISON:  Chest x-ray from yesterday.  FINDINGS: Endotracheal tube ends between the clavicular heads and carina, stable from yesterday. Left upper extremity PICC tip projects at the level of the upper right atrium. The catheter positioning is similar to yesterday when accounting for differences in patient positioning. Orogastric tube at least reaches the diaphragm, beyond which there is nonvisualization.  Streaky lower lung opacities, left more than right. The lateral left costophrenic sulcus is excluded. Given semi-erect state, a pneumothorax should not be missed based on this limitation. No evidence of pleural effusion, excluding the left base.  IMPRESSION: 1. Tubes and central line remain in acceptable position. 2. Bibasilar opacities which could reflect atelectasis or bronchopneumonia.   Electronically Signed   By: Tiburcio Pea M.D.   On: 11/21/2013 06:46    ASSESSMENT / PLAN:  PULMONARY OETT 8/11 >> A: Acute hypercapnic respiratory failure most likely due to acute exacerbation of COPD from RSV B infection (pnuemonitis?) 8/16> - failed wean this am with desats and increased WOB , CXR w/ streaky left basilar opacity  Remains positive bal despite diuresis yest  P:   Wean attempt PS goal 5, cpap 5, goal 30 min , update , failed will re attempt in a few hours , she weans better later in day If fails or not meet criteria extubation - trach at noon friday Neg balance obtained maintain - did well overall, reduce slight Keep iv steroids, reduce BDers Lasix  continued  CARDIOVASCULAR A: Diastolic CHF component? 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 R/o cdiff Sputum culture 8/11 >> resp virus panel 8/12>> RSV B Increase in stool  PLAN Abx: Ceftriaxone 8/11,  -> change to ancef due to fracture/or needs 11/14/13 >>11/15/13 (not going to OR), Levaquin for AECOPD >> (total 5 days)  Follow wbc further Send cdiff, awaited No fevers reported follow  ENDOCRINE  A:  Hyperglycemia, no history of DM2 8/15 >BS tr up  P:   SSI  protocol Lantus   NEUROLOGIC A:  Acute encephalopathy> sounds classic for hypercapnea 8/16 >Intermittently agitated, worse overnight w/ ativan given -more sedation   P:   RASS goal: 0 Fentanyl frequent required, add patch wua  Ortho A: Bilateral LE fractures and Compound R tib-fib fracture P:  Conservative Rx only per Dr Mayra ReelN Michael [facility] of [facility] due to very high risk for local infection and  No benefit in ultimate ability to weight [facility] in 2 weeks from 11/14/13 [facility](TRH or PCCM will need to make this appt)   TODAY'S SUMMARY:  Weaning twice today, dc free water, may nee trach at noon frid if not extubated  Ccm time 30 min   Mcarthur Rossettianiel J. Tyson AliasFeinstein, MD, FACP Pgr: [phone] [facility]

## 2013-11-22 NOTE — Progress Notes (Signed)
[facility] FOR AM LAB REPLACEMENT ONLY  The patient does apply for the [facility] Electrolyte [facility] Protocol based on the criteria listed below:   1. Is GFR >/= 40 ml/min? Yes.    Patient's GFR today is >90 2. Is urine output >/= 0.5 ml/kg/hr for the last 6 hours? Yes.   Patient's UOP is 1.7 ml/kg/hr 3. Is BUN < 60 mg/dL? Yes.    Patient's BUN today is 5 4. Abnormal electrolyte(s): K-3.6, Mg-1.7 5. Ordered repletion with: K&MG 6. If a panic level lab has been reported, has the CCM MD in charge been notified? Yes.  .   Physician:  Lavinia SharpsM Ramaswamy MD  Brandy NakayamaChisholm, [name] 11/22/2013 5:53 AM

## 2013-11-22 NOTE — Progress Notes (Signed)
eLink Physician-Brief Progress Note Patient Name: [name] DOB: February 05, 1951 MRN: [id number]   Date of Service  11/22/2013  HPI/Events of Note  Has developed intermittent A fib with periods that look like sinus, HR 130's  eICU Interventions  - ECG  - metoprolol x 1     Intervention Category Intermediate Interventions: Arrhythmia - evaluation and management  Ad Guttman S. 11/22/2013, 5:45 PM

## 2013-11-22 NOTE — Progress Notes (Signed)
eLink Physician-Brief Progress Note Patient Name: Brandy StainCora E Claar DOB: Nov 26, 1950 MRN: [id number]   Date of Service  11/22/2013  HPI/Events of Note  A Fib w HR 130's. BP tolerating. Metoprolol given x 1  eICU Interventions  Start dilt gtt and follow     Intervention Category Major Interventions: Arrhythmia - evaluation and management  BYRUM,ROBERT S. 11/22/2013, 8:29 PM

## 2013-11-22 NOTE — Progress Notes (Signed)
11/22/2013-Resp care note- Pt suctioned and extubated at 0922 as per MD order.  Pt vital signs post extubation- hr 112, sats 97% on 5lpm cannula, rr26, bp 140/101.  Pt vocalizing post extubation.  Tolerated procedure well.  Will monitor progress and wean as tolerated.  s Angelin Cutrone rrt, rcp

## 2013-11-23 ENCOUNTER — Inpatient Hospital Stay (HOSPITAL_COMMUNITY): Payer: Medicaid Other

## 2013-11-23 ENCOUNTER — Encounter (HOSPITAL_COMMUNITY): Payer: Medicaid Other

## 2013-11-23 LAB — POCT I-STAT 3, ART BLOOD GAS (G3+)
ACID-BASE EXCESS: 28 mmol/L — AB (ref 0.0–2.0)
Bicarbonate: 57.8 mEq/L — ABNORMAL HIGH (ref 20.0–24.0)
O2 Saturation: 92 %
pCO2 arterial: 79.8 mmHg (ref 35.0–45.0)
pH, Arterial: 7.469 — ABNORMAL HIGH (ref 7.350–7.450)
pO2, Arterial: 67 mmHg — ABNORMAL LOW (ref 80.0–100.0)

## 2013-11-23 LAB — CBC WITH DIFFERENTIAL/PLATELET
BASOS PCT: 0 % (ref 0–1)
Basophils Absolute: 0 10*3/uL (ref 0.0–0.1)
EOS PCT: 1 % (ref 0–5)
Eosinophils Absolute: 0.2 10*3/uL (ref 0.0–0.7)
HCT: 35.5 % — ABNORMAL LOW (ref 36.0–46.0)
Hemoglobin: 10.3 g/dL — ABNORMAL LOW (ref 12.0–15.0)
Lymphocytes Relative: 19 % (ref 12–46)
Lymphs Abs: 4.3 10*3/uL — ABNORMAL HIGH (ref 0.7–4.0)
MCH: 30.4 pg (ref 26.0–34.0)
MCHC: 29 g/dL — AB (ref 30.0–36.0)
MCV: 104.7 fL — ABNORMAL HIGH (ref 78.0–100.0)
MONOS PCT: 8 % (ref 3–12)
Monocytes Absolute: 1.9 10*3/uL — ABNORMAL HIGH (ref 0.1–1.0)
NEUTROS PCT: 72 % (ref 43–77)
Neutro Abs: 16.3 10*3/uL — ABNORMAL HIGH (ref 1.7–7.7)
Platelets: 379 10*3/uL (ref 150–400)
RBC: 3.39 MIL/uL — ABNORMAL LOW (ref 3.87–5.11)
RDW: 15 % (ref 11.5–15.5)
WBC: 22.7 10*3/uL — ABNORMAL HIGH (ref 4.0–10.5)

## 2013-11-23 LAB — BASIC METABOLIC PANEL
BUN: 29 mg/dL — AB (ref 6–23)
CHLORIDE: 87 meq/L — AB (ref 96–112)
CO2: >45 mEq/L (ref 19–32)
CREATININE: 0.57 mg/dL (ref 0.50–1.10)
Calcium: 9.5 mg/dL (ref 8.4–10.5)
GFR calc non Af Amer: >90 mL/min (ref >90)
GLUCOSE: 147 mg/dL — AB (ref 70–99)
Potassium: 3.5 mEq/L — ABNORMAL LOW (ref 3.7–5.3)
Sodium: 144 mEq/L (ref 137–147)

## 2013-11-23 LAB — GLUCOSE, CAPILLARY
GLUCOSE-CAPILLARY: 94 mg/dL (ref 70–99)
Glucose-Capillary: 165 mg/dL — ABNORMAL HIGH (ref 70–99)
Glucose-Capillary: 182 mg/dL — ABNORMAL HIGH (ref 70–99)
Glucose-Capillary: 68 mg/dL — ABNORMAL LOW (ref 70–99)
Glucose-Capillary: 86 mg/dL (ref 70–99)
Glucose-Capillary: 93 mg/dL (ref 70–99)

## 2013-11-23 LAB — MAGNESIUM: Magnesium: 2.3 mg/dL (ref 1.5–2.5)

## 2013-11-23 LAB — HEMOGLOBIN A1C
Hgb A1c MFr Bld: 7 % — ABNORMAL HIGH (ref <5.7)
MEAN PLASMA GLUCOSE: 154 mg/dL — AB (ref <117)

## 2013-11-23 MED ORDER — POTASSIUM CHLORIDE CRYS ER 20 MEQ PO TBCR
20.0000 meq | EXTENDED_RELEASE_TABLET | Freq: Once | ORAL | Status: AC
  Administered 2013-11-23: 20 meq via ORAL
  Filled 2013-11-23: qty 1

## 2013-11-23 MED ORDER — PREDNISONE 20 MG PO TABS
20.0000 mg | ORAL_TABLET | Freq: Every day | ORAL | Status: DC
  Administered 2013-11-24 – 2013-11-25 (×2): 20 mg via ORAL
  Filled 2013-11-23 (×4): qty 1

## 2013-11-23 MED ORDER — DILTIAZEM HCL 100 MG IV SOLR
5.0000 mg/h | INTRAVENOUS | Status: AC
  Filled 2013-11-23: qty 100

## 2013-11-23 MED ORDER — FUROSEMIDE 10 MG/ML IJ SOLN
60.0000 mg | Freq: Every day | INTRAMUSCULAR | Status: DC
  Administered 2013-11-24: 60 mg via INTRAVENOUS
  Filled 2013-11-23 (×2): qty 6

## 2013-11-23 MED ORDER — DILTIAZEM HCL 60 MG PO TABS
60.0000 mg | ORAL_TABLET | Freq: Three times a day (TID) | ORAL | Status: DC
  Administered 2013-11-23 – 2013-11-30 (×21): 60 mg via ORAL
  Filled 2013-11-23 (×25): qty 1

## 2013-11-23 MED ORDER — DEXTROSE 50 % IV SOLN
INTRAVENOUS | Status: AC
  Administered 2013-11-23: 25 mL
  Filled 2013-11-23: qty 50

## 2013-11-23 NOTE — Clinical Social Work Psychosocial (Signed)
Clinical Social Work Department BRIEF PSYCHOSOCIAL ASSESSMENT 11/23/2013  Patient:  [name], [name]     Account Number:  [id number]     Admit date:  11/13/2013  Clinical Social Worker:  Delrae Sawyers  Date/Time:  11/23/2013 12:38 PM  Referred by:  Physician  Date Referred:  11/23/2013 Referred for  SNF Placement   Other Referral:   none.   Interview type:  Patient Other interview type:   Pt's sister and niece were also present at bedside during assessment.    PSYCHOSOCIAL DATA Living Status:  FAMILY Admitted from facility:   Level of care:   Primary support name:  Stephenia Vogan Primary support relationship to patient:  CHILD, ADULT Degree of support available:   Adequate support system.    CURRENT CONCERNS Current Concerns  Post-Acute Placement   Other Concerns:   none.    SOCIAL WORK ASSESSMENT / PLAN CSW consulted for possible SNF placement at time of discharge. CSW met with pt, pt's sister, and pt's niece at bedside. Pt stated she is from home with her son. CSW informed pt and pt's family of discharge disposition options once pt medically stable for discharge. Pt stated she was unsure of the possibility of SNF, but is open to PT/OT recommendations.    CSW to continue to follow and assist with discharge planning needs.   Assessment/plan status:  Psychosocial Support/Ongoing Assessment of Needs Other assessment/ plan:   none.   Information/referral to community resources:   Possible SNF placement.    PATIENT'S/FAMILY'S RESPONSE TO PLAN OF CARE: Pt understanding and agreeable to CSW plan of care. Pt expressed desire to await PT/OT recommendation.       Lubertha Sayres, MSW, [facility] Licensed Clinical Social Worker [phone] and [phone]

## 2013-11-23 NOTE — Progress Notes (Signed)
eLink Physician-Brief Progress Note Patient Name: [name] DOB: October 11, 1950 MRN: [id number]   Date of Service  11/23/2013  HPI/Events of Note    eICU Interventions  cpap q hs     Intervention Category Minor Interventions: Routine modifications to care plan (e.g. PRN medications for pain, fever)  ALVA,RAKESH V. 11/23/2013, 5:58 PM

## 2013-11-23 NOTE — Progress Notes (Signed)
Inpatient Diabetes Program Recommendations  AACE/ADA: New Consensus Statement on Inpatient Glycemic Control (2013)  Target Ranges:  Prepandial:   less than 140 mg/dL      Peak postprandial:   less than 180 mg/dL (1-2 hours)      Critically ill patients:  140 - 180 mg/dL   Results for [name], Cadey E (MRN [id number]) as of 11/23/2013 09:13  Ref. Range 11/22/2013 07:42 11/22/2013 12:34 11/22/2013 16:11 11/22/2013 19:25 11/22/2013 23:52 11/23/2013 04:29 11/23/2013 07:52  Glucose-Capillary Latest Range: 70-99 mg/dL 259154 (H) 98 563159 (H) 875159 (H) 86 68 (L) 94   Diabetes history: NO Outpatient Diabetes medications: NA Current orders for Inpatient glycemic control: Lantus 20 units QHS, Novolog 0-20 units Q4H  Inpatient Diabetes Program Recommendations Insulin - Basal: Noted CBG of 68 mg/dl this morning and that pt was extubated on 8/20, no longer on tube feeding, and is currently NPO. Please consider decreasing Lantus to prevent further episodes of hypoglycemia.  Thanks, Orlando PennerMarie Mersades Barbaro, RN, MSN, CCRN Diabetes Coordinator Inpatient Diabetes Program [phone] (Team Pager) [phone] (AP office) [phone] [facility] office)

## 2013-11-23 NOTE — Evaluation (Signed)
Physical Therapy Evaluation Patient Details Name: [name] MRN: [id number] DOB: 10-09-1950 Today's Date: 11/23/2013   History of Present Illness  Pt adm with respiratory failure and bilateral ankle fx's. Intubated on admission and extubated 8/20. Ankle fx's reduced and splinted. PMH - asthma, obesity, CHF    Clinical Impression  Pt admitted with above. Pt currently with functional limitations due to the deficits listed below (see PT Problem List).  Pt will benefit from skilled PT to increase their independence and safety with mobility to allow discharge to the venue listed below. Feel pt will need ST-SNF prior to return home since pt will be NWB.      Follow Up Recommendations SNF    Equipment Recommendations  None recommended by PT    Recommendations for Other Services       Precautions / Restrictions Precautions Precautions: Fall Required Braces or Orthoses: Other Brace/Splint Other Brace/Splint: Bilateral ankle splints Restrictions Weight Bearing Restrictions: Yes RLE Weight Bearing: Non weight bearing LLE Weight Bearing: Non weight bearing      Mobility  Bed Mobility Overal bed mobility: Needs Assistance Bed Mobility: Supine to Sit;Sit to Supine     Supine to sit: +2 for physical assistance;Min assist Sit to supine: +2 for physical assistance;Min assist   General bed mobility comments: Assist to bring trunk up and hips to EOB. Pt able to scoot laterally on EOB.  Transfers                    Ambulation/Gait                Stairs            Wheelchair Mobility    Modified Rankin (Stroke Patients Only)       Balance Overall balance assessment: Needs assistance Sitting-balance support: No upper extremity supported;Feet supported Sitting balance-Leahy Scale: Good                                       Pertinent Vitals/Pain Pain Assessment: No/denies pain    Home Living Family/patient expects to be discharged  to:: Private residence Living Arrangements: Children   Type of Home: Apartment Home Access: Level entry     Home Layout: One level Home Equipment: Environmental consultantWalker - 2 wheels;Wheelchair - manual      Prior Function Level of Independence: Independent with assistive device(s)         Comments: Primarily w/c user. Is able to transfer in/out of w/c on her own and able to walk short distance into bathroom with her walker on her own (per pt report).     Hand Dominance        Extremity/Trunk Assessment   Upper Extremity Assessment: Overall WFL for tasks assessed           Lower Extremity Assessment: Generalized weakness;RLE deficits/detail;LLE deficits/detail RLE Deficits / Details: Able to actively move against gravity. LLE Deficits / Details: Able to actively move against gravity.     Communication   Communication: No difficulties  Cognition Arousal/Alertness: Awake/alert Behavior During Therapy: Impulsive Overall Cognitive Status: No family/caregiver present to determine baseline cognitive functioning Area of Impairment: Safety/judgement         Safety/Judgement: Decreased awareness of deficits;Decreased awareness of safety          General Comments      Exercises        Assessment/Plan  PT Assessment Patient needs continued PT services  PT Diagnosis Difficulty walking   PT Problem List Decreased strength;Decreased activity tolerance;Decreased balance;Decreased mobility;Decreased knowledge of precautions;Decreased safety awareness  PT Treatment Interventions DME instruction;Functional mobility training;Therapeutic activities;Therapeutic exercise;Balance training;Patient/family education   PT Goals (Current goals can be found in the Care Plan section) Acute Rehab PT Goals Patient Stated Goal: Return home PT Goal Formulation: With patient Time For Goal Achievement: 11/30/13 Potential to Achieve Goals: Good    Frequency Min 3X/week   Barriers to discharge         Co-evaluation               End of Session   Activity Tolerance: Patient limited by fatigue Patient left: in bed;with call bell/phone within reach Nurse Communication: Mobility status         Time: 1610-9604 PT Time Calculation (min): 14 min   Charges:   PT Evaluation $Initial PT Evaluation Tier I: 1 Procedure PT Treatments $Therapeutic Activity: 8-22 mins   PT G Codes:          [place] November 28, 2013, 4:49 PM  [facility] PT [phone]

## 2013-11-23 NOTE — Progress Notes (Signed)
PULMONARY / CRITICAL CARE MEDICINE   Name: [name] MRN: [id number] DOB: [birth year]    ADMISSION DATE:  11/13/2013 CONSULTATION DATE:  11/13/2013  REFERRING MD :  [name], EDP  CHIEF COMPLAINT:  Dyspnea, trauma  INITIAL PRESENTATION:  63 y/o female folllowed by Dr Shelle Iron admitted from the [facility] ED on 8/11 with acute hypercapnic respiratory failure and bilateral lower extremity fractures.  Being managed for resp failure, poor weaning, agitation.   has a past medical history of Asthma; Obesity hypoventilation syndrome; OSA (obstructive sleep apnea); Diastolic CHF; Physical deconditioning; and HLD (hyperlipidemia).   has past surgical history that includes Cesarean section.   EVENTS/STUDIES 8/11 CT C/A/P > nonspecific infiltrate LUL, Emphysema, hepatic steatosis, porcelain gallbladder, diverticulosis 8/11 CT head/c-spine> NAICP, but extensive opacification of paranasal sinuses, C spint multifocal osteoarthritic change, no fracture 8/11 bilateral ankle films > R tib-fib fracture, left distal fibula fracture and medial malleolus 11/14/13: Normal WUA. Denies tenderness of c spine. OFf levophed. Going to OR possibly for fracture surgery 11/15/13: Failed SBT but was on sedation at this time. Ortho prefer conservative approach due to high risk local infection status.  8/13 Echo 8/12 with mixture of chronic systolic and diastolic dysfn (ef 45% with grade 1 diast dysfn) 8/18- family meeting, full code, trach wishes if needed 8/20 extubated 8/21 fib rvr, cardizem  SUBJECTIVE/OVERNIGHT/INTERVAL HX: Extubated Rate controlled on drip 3.6 liters neg  VITAL SIGNS: Temp:  [92.7 F (33.7 C)-99.2 F (37.3 C)] 97.8 F (36.6 C) (08/21 0900) Pulse Rate:  [31-135] 93 (08/21 0900) Resp:  [16-41] 16 (08/21 0900) BP: (98-162)/(58-141) 113/72 mmHg (08/21 0900) SpO2:  [60 %-100 %] 96 % (08/21 0900) Weight:  [120.4 kg (265 lb 6.9 oz)] 120.4 kg (265 lb 6.9 oz) (08/21 0500) HEMODYNAMICS:   VENTILATOR  SETTINGS:   INTAKE / OUTPUT:  Intake/Output Summary (Last 24 hours) at 11/23/13 1147 Last data filed at 11/23/13 0900  Gross per 24 hour  Intake 338.75 ml  Output   4475 ml  Net -4136.25 ml    PHYSICAL EXAMINATION: Gen:  No distress HEENT: jvd PULM: CTa apical CV:  s1 s 2 irr Ab: BS+, soft, nontender, morbdily obese  Ext: warm, bilateral splints in place Derm: diffuse dryness, with patches of dry skin, on back also lichenification Neuro : follows commands, alert  LABS: PULMONARY  Recent Labs Lab 11/21/13 1050  PHART 7.303*  PCO2ART 107.0*  PO2ART 69.4*  HCO3 51.6*  TCO2 54.9  O2SAT 90.6    CBC  Recent Labs Lab 11/20/13 0350 11/22/13 0430 11/23/13 0500  HGB 10.2* 9.1* 10.3*  HCT 34.1* 29.8* 35.5*  WBC 29.7* 28.1* 22.7*  PLT 308 336 379    COAGULATION No results found for this basename: INR,  in the last 168 hours  CARDIAC   No results found for this basename: TROPONINI,  in the last 168 hours  Recent Labs Lab 11/18/13 0241 11/19/13 0346  PROBNP 388.9* 227.7*     CHEMISTRY  Recent Labs Lab 11/17/13 0230 11/18/13 0241 11/19/13 0346 11/20/13 0350 11/21/13 0500 11/22/13 0430 11/23/13 0500  NA 146 147 151* 152* 145 141 144  K 5.1 4.8 4.0 3.7 4.0 3.6* 3.5*  CL 103 101 103 100 92* 85* 87*  CO2 32 37* 40* 43* >45* >45* >45*  GLUCOSE 252* 243* 138* 198* 143* 147* 147*  BUN 32* 35* 44* 41* 39* 36* 29*  CREATININE 0.58 0.61 0.59 0.55 0.58 0.61 0.57  CALCIUM 9.1 9.0 9.4 9.3 9.3 9.0 9.5  MG 2.4 2.4  --  2.1  --  1.7 2.3  PHOS 2.3 3.2  --  4.3  --  2.8  --    Estimated Creatinine Clearance: 97 ml/min (by C-G formula based on Cr of 0.57).   LIVER No results found for this basename: AST, ALT, ALKPHOS, BILITOT, PROT, ALBUMIN, INR,  in the last 168 hours   INFECTIOUS No results found for this basename: LATICACIDVEN, PROCALCITON,  in the last 168 hours  ENDOCRINE CBG (last 3)   Recent Labs  11/22/13 2352 11/23/13 0429 11/23/13 0752   GLUCAP 86 68* 94    IMAGING x48h No results found.  ASSESSMENT / PLAN:  PULMONARY OETT 8/11 >> A: Acute hypercapnic respiratory failure most likely due to acute exacerbation of COPD from RSV B infection (pnuemonitis?) 8/16> - failed wean this am with desats and increased WOB , CXR w/ streaky left basilar opacity  Remains positive bal despite diuresis yest  P:   IS Neg balance, maintain BDers Reduce roids to pred 20 daily NO role acetazolamide  CARDIOVASCULAR A: Diastolic CHF component?, new fib rvr P:  Lasix to neg balance, successful, maintain this Tele Cardizem to control IV, add oral to dc IV Fall risk , no anticoagulation  RENAL A:  pulm edema Hypernatremia imrpoved hypok   P:   Chem in am  Lasix reduce to daily dosing kvo d5w supp K iV  GASTROINTESTINAL A:  Nutrition  Morbid obesity  P:   Start diet Pepcid for stress ulcer prophylaxis  HEMATOLOGIC A:  Anemia, DV prevention, hemoconcetration P:  Lovenox  Cbc in am   INFECTIOUS A:  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 R/o cdiff 8/20 >>>neg Sputum culture 8/11 >> resp virus panel 8/12>> RSV B Increase in stool  PLAN Abx: Ceftriaxone 8/11,  -> change to ancef due to fracture/or needs 11/14/13 >>11/15/13 (not going to OR), Levaquin for AECOPD >> (total 5 days) observe  ENDOCRINE A:  Hyperglycemia, no history of DM2 8/15 >BS tr up  hypo x 1 P:   SSI  protocol Lantus dc hgb a1c confirms dm Diet start  NEUROLOGIC A:  Acute encephalopathy> sounds classic for hypercapnea 8/16 >Intermittently agitated, worse overnight w/ ativan given -more sedation   P:   RASS goal: 0 Fentanyl patch pt  Ortho A: Bilateral LE fractures and Compound R tib-fib fracture P:  Conservative Rx only per Dr Mayra ReelN Michael [facility] of [facility] due to very high risk for local infection and  No benefit in ultimate ability to weight [facility] in 2 weeks from 11/14/13 [facility](TRH or PCCM will need to make this appt)   TODAY'S SUMMARY:  Fib  rvr now, lasix, cardizem  Ccm time 30 min   Mcarthur Rossettianiel J. Tyson AliasFeinstein, MD, FACP Pgr: [phone] [facility]

## 2013-11-23 NOTE — Progress Notes (Signed)
RT Note: CPAP set up on auto titrate due to pt not sure of settings. Per pt she has a nasal mask. I placed mask to her face and she pulled it off stating the mask is too small. I explained that is the largest nasal mask we have and offered her a FFM to try. She said she doesn't want one and will have someone bring hers from home. RT will continue to monitor. Pt placed back on 4L [place] tolerating well at this time.

## 2013-11-24 ENCOUNTER — Inpatient Hospital Stay (HOSPITAL_COMMUNITY): Payer: Medicaid Other

## 2013-11-24 DIAGNOSIS — G4733 Obstructive sleep apnea (adult) (pediatric): Secondary | ICD-10-CM

## 2013-11-24 LAB — GLUCOSE, CAPILLARY
GLUCOSE-CAPILLARY: 141 mg/dL — AB (ref 70–99)
GLUCOSE-CAPILLARY: 105 mg/dL — AB (ref 70–99)
GLUCOSE-CAPILLARY: 166 mg/dL — AB (ref 70–99)
Glucose-Capillary: 85 mg/dL (ref 70–99)
Glucose-Capillary: 134 mg/dL — ABNORMAL HIGH (ref 70–99)
Glucose-Capillary: 160 mg/dL — ABNORMAL HIGH (ref 70–99)

## 2013-11-24 LAB — BASIC METABOLIC PANEL
BUN: 26 mg/dL — AB (ref 6–23)
CALCIUM: 9.5 mg/dL (ref 8.4–10.5)
CREATININE: 0.6 mg/dL (ref 0.50–1.10)
Chloride: 86 mEq/L — ABNORMAL LOW (ref 96–112)
GFR calc Af Amer: >90 mL/min (ref >90)
Glucose, Bld: 108 mg/dL — ABNORMAL HIGH (ref 70–99)
Potassium: 4.2 mEq/L (ref 3.7–5.3)
Sodium: 144 mEq/L (ref 137–147)

## 2013-11-24 LAB — PHOSPHORUS: Phosphorus: 4.1 mg/dL (ref 2.3–4.6)

## 2013-11-24 LAB — MAGNESIUM: Magnesium: 2.3 mg/dL (ref 1.5–2.5)

## 2013-11-24 MED ORDER — ACETAMINOPHEN 325 MG PO TABS
650.0000 mg | ORAL_TABLET | Freq: Four times a day (QID) | ORAL | Status: DC | PRN
  Administered 2013-11-24 – 2013-12-02 (×8): 650 mg via ORAL
  Filled 2013-11-24 (×8): qty 2

## 2013-11-24 MED ORDER — GUAIFENESIN ER 600 MG PO TB12
600.0000 mg | ORAL_TABLET | Freq: Two times a day (BID) | ORAL | Status: DC
  Administered 2013-11-25 – 2013-11-27 (×6): 600 mg via ORAL
  Filled 2013-11-24 (×7): qty 1

## 2013-11-24 MED ORDER — CALCIUM CARBONATE ANTACID 500 MG PO CHEW
2.0000 | CHEWABLE_TABLET | ORAL | Status: DC | PRN
  Administered 2013-11-24 – 2013-12-03 (×14): 400 mg via ORAL
  Filled 2013-11-24 (×9): qty 2

## 2013-11-24 NOTE — Progress Notes (Signed)
PULMONARY / CRITICAL CARE MEDICINE   Name: [name] MRN: [id number] DOB: 31-Jul-1950    ADMISSION DATE:  11/13/2013 CONSULTATION DATE:  11/13/2013  REFERRING MD :  [name], EDP  CHIEF COMPLAINT:  Dyspnea, trauma  INITIAL PRESENTATION:  63 y/o female folllowed by Dr Shelle Iron admitted from the [facility] ED on 8/11 with acute hypercapnic respiratory failure and bilateral lower extremity fractures.  Being managed for resp failure, poor weaning, agitation.   EVENTS/STUDIES 8/11 CT C/A/P > nonspecific infiltrate LUL, Emphysema, hepatic steatosis, porcelain gallbladder, diverticulosis 8/11 CT head/c-spine> NAICP, but extensive opacification of paranasal sinuses, C spint multifocal osteoarthritic change, no fracture 8/11 bilateral ankle films > R tib-fib fracture, left distal fibula fracture and medial malleolus 11/14/13: Normal WUA. Denies tenderness of c spine. OFf levophed. Going to OR possibly for fracture surgery 11/15/13: Failed SBT but was on sedation at this time. Ortho prefer conservative approach due to high risk local infection status.  8/13 Echo 8/12 with mixture of chronic systolic and diastolic dysfn (ef 45% with grade 1 diast dysfn) 8/18- family meeting, full code, trach wishes if needed 8/20 extubated 8/21 fib rvr, cardizem  SUBJECTIVE/OVERNIGHT/INTERVAL HX: Rate controlled off cardizem gtt C/o sob  Diarrhea   VITAL SIGNS: Temp:  [97.6 F (36.4 C)-99 F (37.2 C)] 98.5 F (36.9 C) (08/22 0800) Pulse Rate:  [87-100] 87 (08/22 0800) Resp:  [14-34] 21 (08/22 0800) BP: (94-144)/(60-90) 141/66 mmHg (08/22 0800) SpO2:  [91 %-99 %] 97 % (08/22 0800) Weight:  [260 lb 5.8 oz (118.1 kg)] 260 lb 5.8 oz (118.1 kg) (08/22 0400) HEMODYNAMICS:   VENTILATOR SETTINGS:   INTAKE / OUTPUT:  Intake/Output Summary (Last 24 hours) at 11/24/13 1039 Last data filed at 11/24/13 0900  Gross per 24 hour  Intake   1015 ml  Output   1875 ml  Net   -860 ml    PHYSICAL EXAMINATION: Gen:  No  distress HEENT: mm dry, no jvd  PULM: resps even non labored on [place], diminished bases, few scattered rhonchi  CV:  s1 s 2 irr Ab: BS+, soft, nontender, morbdily obese  Ext: warm, bilateral splints in place Derm: diffuse dryness, with patches of dry skin, on back also lichenification Neuro : follows commands, alert  LABS: PULMONARY  Recent Labs Lab 11/21/13 1050 11/23/13 1859  PHART 7.303* 7.469*  PCO2ART 107.0* 79.8*  PO2ART 69.4* 67.0*  HCO3 51.6* 57.8*  TCO2 54.9 >50  O2SAT 90.6 92.0    CBC  Recent Labs Lab 11/20/13 0350 11/22/13 0430 11/23/13 0500  HGB 10.2* 9.1* 10.3*  HCT 34.1* 29.8* 35.5*  WBC 29.7* 28.1* 22.7*  PLT 308 336 379    COAGULATION No results found for this basename: INR,  in the last 168 hours  CARDIAC   No results found for this basename: TROPONINI,  in the last 168 hours  Recent Labs Lab 11/18/13 0241 11/19/13 0346  PROBNP 388.9* 227.7*     CHEMISTRY  Recent Labs Lab 11/18/13 0241  11/20/13 0350 11/21/13 0500 11/22/13 0430 11/23/13 0500 11/24/13 0319  NA 147  < > 152* 145 141 144 144  K 4.8  < > 3.7 4.0 3.6* 3.5* 4.2  CL 101  < > 100 92* 85* 87* 86*  CO2 37*  < > 43* >45* >45* >45* >45*  GLUCOSE 243*  < > 198* 143* 147* 147* 108*  BUN 35*  < > 41* 39* 36* 29* 26*  CREATININE 0.61  < > 0.55 0.58 0.61 0.57 0.60  CALCIUM  9.0  < > 9.3 9.3 9.0 9.5 9.5  MG 2.4  --  2.1  --  1.7 2.3 2.3  PHOS 3.2  --  4.3  --  2.8  --  4.1  < > = values in this interval not displayed. Estimated Creatinine Clearance: 96 ml/min (by C-G formula based on Cr of 0.6).   LIVER No results found for this basename: AST, ALT, ALKPHOS, BILITOT, PROT, ALBUMIN, INR,  in the last 168 hours   INFECTIOUS No results found for this basename: LATICACIDVEN, PROCALCITON,  in the last 168 hours  ENDOCRINE CBG (last 3)   Recent Labs  11/23/13 2311 11/24/13 0330 11/24/13 0714  GLUCAP 141* 105* 85    IMAGING x48h No results found.  ASSESSMENT /  PLAN:  PULMONARY OETT 8/11 >>8/20 A: Acute hypercapnic respiratory failure most likely due to acute exacerbation of COPD from RSV B infection (pnuemonitis?) P:   IS, add flutter  Diuresis as able BDs Continue prednisone with slow taper  F/u CXR   CARDIOVASCULAR A: Diastolic CHF component?, new fib rvr P:  Continue lasix as Scr and BP tol  Tele Cont PO cardizem  Fall risk , no anticoagulation  RENAL A:  pulm edema Hypernatremia improved  Hypokalemia  P:   Chem in am  Cont daily lasix  Replete K PRN   GASTROINTESTINAL A:  Nutrition  Morbid obesity  Diarrhea - CDiff neg  P:   Start diet Pepcid for stress ulcer prophylaxis  HEMATOLOGIC A:  Anemia, DV prevention, hemoconcetration P:  Lovenox  Cbc in am   INFECTIOUS A:  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 R/o cdiff 8/20 >>>neg Sputum culture 8/11 >>normal flora  resp virus panel 8/12>> RSV B  PLAN Observe off abx - s/p 5 day course   ENDOCRINE A:  Hyperglycemia, new dx DM  P:   SSI  protocol hgb a1c confirms dm   NEUROLOGIC A:  Acute encephalopathy> improved - suspect r/t hypercapnea P:   Cont low dose Fentanyl patch pt  Ortho A: Bilateral LE fractures and Compound R tib-fib fracture P:  Conservative Rx only per Dr Mayra Reel of [facility] due to very high risk for local infection and  No benefit in ultimate ability to weight bear [facility] in 2 weeks from 11/14/13 [facility] or PCCM will need to make this appt)   TODAY'S SUMMARY:  Rate controlled on po cardizem, remains sob.  Check CXR . Leave in icu for now.  If remains stable consider tx floor/triad in am 8/23  Dirk Dress, NP 11/24/2013  10:39 AM Pager: [phone] or [phone]  *Care during the described time interval was provided by me and/or other providers on the critical care team. I have reviewed this patient's available data, including medical history, events of note, physical examination and test results as part of my  evaluation.  Levy Pupa, MD, PhD 11/24/2013, 4:29 PM [facility] [phone] or if no answer [phone]

## 2013-11-24 NOTE — Progress Notes (Signed)
CRITICAL VALUE ALERT  Critical value received:  CO2- >45  Date of notification:  11/24/2013  Time of notification:  4:19 AM  Critical value read back:Yes.    Nurse who received alert:  Terrilyn SaverHopper, Davius Goudeau Anderson  MD notified (1st page):  Dr. Curt BearsBelanger  Time of first page:  4:19 AM  MD notified (2nd page):  Time of second page:  Responding MD:  Dr. Curt BearsBelanger  Time MD responded:  4:19 AM

## 2013-11-24 NOTE — Progress Notes (Signed)
Pt states she didn't sleep well last night, says CPAP mask was uncomfortable and didn't fit right. Told pt she could have family member bring her own CPAP mask/tubing if that would help her rest better. RN to call family to request they bring home equipment. Pt seems satisfied with decision. RT will continue to monitor.

## 2013-11-25 LAB — CBC
HCT: 33.3 % — ABNORMAL LOW (ref 36.0–46.0)
Hemoglobin: 10.4 g/dL — ABNORMAL LOW (ref 12.0–15.0)
MCH: 31.1 pg (ref 26.0–34.0)
MCHC: 31.2 g/dL (ref 30.0–36.0)
MCV: 99.7 fL (ref 78.0–100.0)
PLATELETS: 429 10*3/uL — AB (ref 150–400)
RBC: 3.34 MIL/uL — AB (ref 3.87–5.11)
RDW: 14.9 % (ref 11.5–15.5)
WBC: 21.8 10*3/uL — ABNORMAL HIGH (ref 4.0–10.5)

## 2013-11-25 LAB — BASIC METABOLIC PANEL
BUN: 25 mg/dL — ABNORMAL HIGH (ref 6–23)
Calcium: 9.7 mg/dL (ref 8.4–10.5)
Chloride: 86 mEq/L — ABNORMAL LOW (ref 96–112)
Creatinine, Ser: 0.66 mg/dL (ref 0.50–1.10)
GFR calc non Af Amer: >90 mL/min (ref >90)
Glucose, Bld: 118 mg/dL — ABNORMAL HIGH (ref 70–99)
Potassium: 3.7 mEq/L (ref 3.7–5.3)
SODIUM: 141 meq/L (ref 137–147)

## 2013-11-25 LAB — GLUCOSE, CAPILLARY
GLUCOSE-CAPILLARY: 245 mg/dL — AB (ref 70–99)
GLUCOSE-CAPILLARY: 98 mg/dL (ref 70–99)
Glucose-Capillary: 107 mg/dL — ABNORMAL HIGH (ref 70–99)
Glucose-Capillary: 173 mg/dL — ABNORMAL HIGH (ref 70–99)
Glucose-Capillary: 226 mg/dL — ABNORMAL HIGH (ref 70–99)
Glucose-Capillary: 68 mg/dL — ABNORMAL LOW (ref 70–99)
Glucose-Capillary: 92 mg/dL (ref 70–99)

## 2013-11-25 LAB — BLOOD GAS, ARTERIAL
Acid-Base Excess: 21.5 mmol/L — ABNORMAL HIGH (ref 0.0–2.0)
Bicarbonate: 47.7 mEq/L — ABNORMAL HIGH (ref 20.0–24.0)
DRAWN BY: 249101
O2 CONTENT: 4 L/min
O2 Saturation: 90.7 %
PATIENT TEMPERATURE: 98.6
PCO2 ART: 75.9 mmHg — AB (ref 35.0–45.0)
TCO2: 50 mmol/L (ref 0–100)
pH, Arterial: 7.414 (ref 7.350–7.450)
pO2, Arterial: 65.5 mmHg — ABNORMAL LOW (ref 80.0–100.0)

## 2013-11-25 MED ORDER — INSULIN ASPART 100 UNIT/ML ~~LOC~~ SOLN
0.0000 [IU] | Freq: Three times a day (TID) | SUBCUTANEOUS | Status: DC
  Administered 2013-11-25 (×2): 3 [IU] via SUBCUTANEOUS
  Administered 2013-11-26 (×2): 2 [IU] via SUBCUTANEOUS
  Administered 2013-11-27: 5 [IU] via SUBCUTANEOUS
  Administered 2013-11-27 (×2): 2 [IU] via SUBCUTANEOUS
  Administered 2013-11-28: 3 [IU] via SUBCUTANEOUS
  Administered 2013-11-28: 2 [IU] via SUBCUTANEOUS
  Administered 2013-11-29: 1 [IU] via SUBCUTANEOUS
  Administered 2013-11-29: 3 [IU] via SUBCUTANEOUS
  Administered 2013-11-29: 2 [IU] via SUBCUTANEOUS
  Administered 2013-11-30: 3 [IU] via SUBCUTANEOUS
  Administered 2013-11-30: 2 [IU] via SUBCUTANEOUS
  Administered 2013-12-01: 3 [IU] via SUBCUTANEOUS
  Administered 2013-12-01 – 2013-12-02 (×2): 2 [IU] via SUBCUTANEOUS
  Administered 2013-12-02 – 2013-12-03 (×2): 5 [IU] via SUBCUTANEOUS

## 2013-11-25 MED ORDER — FUROSEMIDE 40 MG PO TABS
40.0000 mg | ORAL_TABLET | Freq: Every day | ORAL | Status: DC
  Administered 2013-11-25 – 2013-12-03 (×9): 40 mg via ORAL
  Filled 2013-11-25 (×9): qty 1

## 2013-11-25 MED ORDER — FENTANYL CITRATE 0.05 MG/ML IJ SOLN
50.0000 ug | INTRAMUSCULAR | Status: DC | PRN
  Administered 2013-11-30: 50 ug via INTRAVENOUS
  Filled 2013-11-25: qty 2

## 2013-11-25 MED ORDER — ALPRAZOLAM 0.25 MG PO TABS
0.2500 mg | ORAL_TABLET | Freq: Two times a day (BID) | ORAL | Status: DC | PRN
  Administered 2013-11-25 – 2013-12-02 (×5): 0.25 mg via ORAL
  Filled 2013-11-25 (×8): qty 1

## 2013-11-25 MED ORDER — IPRATROPIUM-ALBUTEROL 0.5-2.5 (3) MG/3ML IN SOLN
3.0000 mL | RESPIRATORY_TRACT | Status: DC
  Administered 2013-11-25 – 2013-12-01 (×34): 3 mL via RESPIRATORY_TRACT
  Filled 2013-11-25 (×16): qty 3
  Filled 2013-11-25: qty 6
  Filled 2013-11-25 (×16): qty 3

## 2013-11-25 MED ORDER — GI COCKTAIL ~~LOC~~
30.0000 mL | Freq: Once | ORAL | Status: AC
  Administered 2013-11-25: 30 mL via ORAL
  Filled 2013-11-25: qty 30

## 2013-11-25 NOTE — Progress Notes (Signed)
Hypoglycemic Event  CBG: 68  Treatment: 15 GM carbohydrate snack  Symptoms: None  Follow-up CBG: Time: 0355 CBG Result: 92  Possible Reasons for Event: Inadequate meal intake  Comments/MD notified:     [name], [name]  Remember to initiate Hypoglycemia Order Set & complete

## 2013-11-25 NOTE — Progress Notes (Signed)
PULMONARY / CRITICAL CARE MEDICINE   Name: [name] MRN: [id number] DOB: [birth year]    ADMISSION DATE:  11/13/2013 CONSULTATION DATE:  11/13/2013  REFERRING MD :  [name], EDP  CHIEF COMPLAINT:  Dyspnea, trauma  INITIAL PRESENTATION:  63 y/o female folllowed by Dr Shelle Iron admitted from the [facility] ED on 8/11 with acute hypercapnic respiratory failure and bilateral lower extremity fractures.  Being managed for resp failure, poor weaning, agitation.   EVENTS/STUDIES 8/11 CT C/A/P > nonspecific infiltrate LUL, Emphysema, hepatic steatosis, porcelain gallbladder, diverticulosis 8/11 CT head/c-spine> NAICP, but extensive opacification of paranasal sinuses, C spint multifocal osteoarthritic change, no fracture 8/11 bilateral ankle films > R tib-fib fracture, left distal fibula fracture and medial malleolus 11/14/13: Normal WUA. Denies tenderness of c spine. OFf levophed. Going to OR possibly for fracture surgery 11/15/13: Failed SBT but was on sedation at this time. Ortho prefer conservative approach due to high risk local infection status.  8/13 Echo 8/12 with mixture of chronic systolic and diastolic dysfn (ef 45% with grade 1 diast dysfn) 8/18- family meeting, full code, trach wishes if needed 8/20 extubated 8/21 fib rvr, cardizem  SUBJECTIVE/OVERNIGHT/INTERVAL HX: Rate controlled off cardizem gtt.  SOB improved.   VITAL SIGNS: Temp:  [97.7 F (36.5 C)-98.8 F (37.1 C)] 98.2 F (36.8 C) (08/23 0726) Pulse Rate:  [79-110] 80 (08/23 0800) Resp:  [17-30] 22 (08/23 0800) BP: (99-140)/(61-102) 132/66 mmHg (08/23 0800) SpO2:  [88 %-100 %] 100 % (08/23 0800) Weight:  [258 lb 6.1 oz (117.2 kg)] 258 lb 6.1 oz (117.2 kg) (08/23 0500) HEMODYNAMICS:   VENTILATOR SETTINGS:   INTAKE / OUTPUT:  Intake/Output Summary (Last 24 hours) at 11/25/13 0858 Last data filed at 11/25/13 0800  Gross per 24 hour  Intake    300 ml  Output   1925 ml  Net  -1625 ml    PHYSICAL EXAMINATION: Gen:  No  distress HEENT: mm dry, no jvd  PULM: resps even non labored on [place], diminished bases, few scattered rhonchi  CV:  s1 s 2 irr Ab: BS+, soft, nontender, morbdily obese  Ext: warm, BLE splints in place Derm: diffuse dryness, with patches of dry skin, on back also lichenification Neuro : follows commands, alert  LABS: PULMONARY  Recent Labs Lab 11/21/13 1050 11/23/13 1859  PHART 7.303* 7.469*  PCO2ART 107.0* 79.8*  PO2ART 69.4* 67.0*  HCO3 51.6* 57.8*  TCO2 54.9 >50  O2SAT 90.6 92.0    CBC  Recent Labs Lab 11/22/13 0430 11/23/13 0500 11/25/13 0400  HGB 9.1* 10.3* 10.4*  HCT 29.8* 35.5* 33.3*  WBC 28.1* 22.7* 21.8*  PLT 336 379 429*    COAGULATION No results found for this basename: INR,  in the last 168 hours  CARDIAC   No results found for this basename: TROPONINI,  in the last 168 hours  Recent Labs Lab 11/19/13 0346  PROBNP 227.7*     CHEMISTRY  Recent Labs Lab 11/20/13 0350 11/21/13 0500 11/22/13 0430 11/23/13 0500 11/24/13 0319 11/25/13 0400  NA 152* 145 141 144 144 141  K 3.7 4.0 3.6* 3.5* 4.2 3.7  CL 100 92* 85* 87* 86* 86*  CO2 43* >45* >45* >45* >45* >45*  GLUCOSE 198* 143* 147* 147* 108* 118*  BUN 41* 39* 36* 29* 26* 25*  CREATININE 0.55 0.58 0.61 0.57 0.60 0.66  CALCIUM 9.3 9.3 9.0 9.5 9.5 9.7  MG 2.1  --  1.7 2.3 2.3  --   PHOS 4.3  --  2.8  --  4.1  --    Estimated Creatinine Clearance: 95.6 ml/min (by C-G formula based on Cr of 0.66).   LIVER No results found for this basename: AST, ALT, ALKPHOS, BILITOT, PROT, ALBUMIN, INR,  in the last 168 hours   INFECTIOUS No results found for this basename: LATICACIDVEN, PROCALCITON,  in the last 168 hours  ENDOCRINE CBG (last 3)   Recent Labs  11/25/13 0333 11/25/13 0356 11/25/13 0700  GLUCAP 68* 92 107*    IMAGING x48h Dg Chest Portable 1 View  11/24/2013   CLINICAL DATA:  63 year old female with shortness of Breath. Initial encounter.  EXAM: PORTABLE CHEST - 1 VIEW   COMPARISON:  11/21/2013 and earlier.  FINDINGS: Portable AP semi upright view at 1837 hrs. Extubated and enteric tube removed. Stable left PICC line. Stable lung volumes. Improved bibasilar ventilation, with mild residual opacity on the left. No areas of worsening ventilation Stable cardiac size and mediastinal contours.  IMPRESSION: 1. Extubated and enteric tube removed. 2. Interval improved basilar ventilation, mild residual passed be on the left.   Electronically Signed   By: Augusto Gamble M.D.   On: 11/24/2013 19:05    ASSESSMENT / PLAN:  PULMONARY OETT 8/11 >>8/20 A: Acute hypercapnic respiratory failure most likely due to acute exacerbation of COPD from RSV B infection (pnuemonitis?) OSA P:   Aggressive pulm hygiene  OOB  Diuresis as able BDs -- duoneb, pulmicort  Continue prednisone with slow taper  F/u CXR  qhs CPAP  CARDIOVASCULAR volume overload -  Diastolic CHF component? - improved.  Nearing net neg.   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 R/o cdiff 8/20 >>>neg Sputum culture 8/11 >>normal flora  resp virus panel 8/12>> RSV B PLAN Observe off abx - s/p 5 day course abx  ENDOCRINE A:  new dx DM Hypoglycemia  P:   SSI  Protocol - change to sensitive scale 8/23 no hs coverage hgb a1c confirms dm   NEUROLOGIC A:  Acute encephalopathy> improved - suspect r/t hypercapnea P:   Cont low dose Fentanyl patch PT/OT  Ortho A: Bilateral LE fractures and Compound R tib-fib fracture P:  Conservative Rx only per Dr Mayra Reel of [facility] due to very high risk for local  infection and  No benefit in ultimate ability to weight bear [facility] in 2 weeks from 11/14/13 [facility] or PCCM will need to make this appt) PT/OT   TODAY'S SUMMARY:  Rate controlled on po cardizem, SOB much improved.  Aggressive pulm hygiene, mobilize.  Tx tele/Tri[facility] 8/24.  Will need SNF at d/c.   Dirk Dress, NP 11/25/2013  8:58 AM Pager: [phone] or [phone]  *Care during the described time interval was provided by me and/or other providers on the critical care team. I have reviewed this patient's available data, including medical history, events of note, physical examination and test results as part of my evaluation.  Levy Pupa, MD, PhD 11/25/2013, 3:52 PM [place]  [facility] [phone] or if no answer [phone]

## 2013-11-25 NOTE — Significant Event (Signed)
Rapid Response Event Note  Overview: Time Called: 1535 Arrival Time: 1535 Event Type: Respiratory  Initial Focused Assessment:  Called by primary RN to evaluate new transfer from ICU with SOB.  Upon my arrival to patients room, RN at bedside.  Patient sitting high in bed with nasal cannula 4lpm, Sats 93%, RR 26, HR 100.  Patient with SOB and increased WOB using accessory muscles.     Interventions:  RT called by primary RN for breathing treatment.  Breath Sounds diminished with inspiratory and expiratory wheezes.  Sat 99% now.  Dr. Vassie Loll paged and updated, ABG ordered, awaiting results.  Patient seems a little bit better post breathing treatment, not as SOB.  Orpha Bur, NP at bedside to evaluate patient.     Event Summary:  Rn to call if assistance needed   at      at          [facility], Maryagnes Amos

## 2013-11-25 NOTE — Significant Event (Signed)
Dirk Dress, NP paged and confirmed she had received  blood gas results .

## 2013-11-25 NOTE — Progress Notes (Signed)
Pt had taken herself off CPAP due to coughing. RT placed back on post tx.

## 2013-11-25 NOTE — Significant Event (Addendum)
Received from [facility] ICU . Shortly after arrival to this unit patient c/o not being able to breath and  became increasing SOB , lungs diminished in all fields bilat  with inspiratory, expiratory wheezing coughing up small amt thick green sputum, resp 26 to 28 . o2 sats 92% on 4 lit n/c .Resp therapy called for neb tx. .Rapid response called to evaluate patient . CCU MD called , ABG'S order ,Will continue to monitor patient.

## 2013-11-25 NOTE — Progress Notes (Signed)
Switched to blue flutter valve to assist pt in ease of use

## 2013-11-26 ENCOUNTER — Inpatient Hospital Stay (HOSPITAL_COMMUNITY): Payer: Medicaid Other

## 2013-11-26 DIAGNOSIS — B338 Other specified viral diseases: Secondary | ICD-10-CM

## 2013-11-26 DIAGNOSIS — B974 Respiratory syncytial virus as the cause of diseases classified elsewhere: Secondary | ICD-10-CM

## 2013-11-26 LAB — BASIC METABOLIC PANEL
BUN: 21 mg/dL (ref 6–23)
CALCIUM: 9.4 mg/dL (ref 8.4–10.5)
Chloride: 87 mEq/L — ABNORMAL LOW (ref 96–112)
Creatinine, Ser: 0.65 mg/dL (ref 0.50–1.10)
GFR calc Af Amer: >90 mL/min (ref >90)
GFR calc non Af Amer: >90 mL/min (ref >90)
Glucose, Bld: 90 mg/dL (ref 70–99)
Potassium: 3.6 mEq/L — ABNORMAL LOW (ref 3.7–5.3)
SODIUM: 142 meq/L (ref 137–147)

## 2013-11-26 LAB — GLUCOSE, CAPILLARY
GLUCOSE-CAPILLARY: 81 mg/dL (ref 70–99)
GLUCOSE-CAPILLARY: 164 mg/dL — AB (ref 70–99)
GLUCOSE-CAPILLARY: 206 mg/dL — AB (ref 70–99)
Glucose-Capillary: 168 mg/dL — ABNORMAL HIGH (ref 70–99)

## 2013-11-26 LAB — CBC
HCT: 35.2 % — ABNORMAL LOW (ref 36.0–46.0)
Hemoglobin: 10.8 g/dL — ABNORMAL LOW (ref 12.0–15.0)
MCH: 31.1 pg (ref 26.0–34.0)
MCHC: 30.7 g/dL (ref 30.0–36.0)
MCV: 101.4 fL — AB (ref 78.0–100.0)
PLATELETS: 406 10*3/uL — AB (ref 150–400)
RBC: 3.47 MIL/uL — ABNORMAL LOW (ref 3.87–5.11)
RDW: 14.9 % (ref 11.5–15.5)
WBC: 21.6 10*3/uL — AB (ref 4.0–10.5)

## 2013-11-26 MED ORDER — ARFORMOTEROL TARTRATE 15 MCG/2ML IN NEBU
15.0000 ug | INHALATION_SOLUTION | Freq: Two times a day (BID) | RESPIRATORY_TRACT | Status: DC
  Administered 2013-11-26 – 2013-12-03 (×13): 15 ug via RESPIRATORY_TRACT
  Filled 2013-11-26 (×19): qty 2

## 2013-11-26 MED ORDER — METHYLPREDNISOLONE SODIUM SUCC 40 MG IJ SOLR
40.0000 mg | Freq: Two times a day (BID) | INTRAMUSCULAR | Status: DC
  Administered 2013-11-26 – 2013-11-27 (×4): 40 mg via INTRAVENOUS
  Filled 2013-11-26 (×6): qty 1

## 2013-11-26 NOTE — Progress Notes (Signed)
PROGRESS NOTE    [name] ZOX:[id number] DOB: Nov 04, 1950 DOA: 11/13/2013 PCP: No primary provider on file.  HPI/Brief narrative 63 y/o female folllowed by Dr Shelle Iron admitted from the [facility] ED on 8/11 with acute hypercapnic respiratory failure and bilateral lower extremity fractures. Being managed for resp failure, poor weaning, agitation.   Assessment/Plan:  1. Acute on chronic hypercapnic respiratory failure: Secondary to COPD exacerbation from RSV B infection/? Pneumonitis: Extubated on 8/20. Completed 5 days course of antibiotics. Continues to have significant dyspnea and rapid response was called last night. Pulmonary following. Changed steroids from by mouth prednisone to Solu-Medrol 40 mg every 12 and Brovana added. Continue Lasix, DuoNeb, Pulmicort, Mucinex and nightly CPAP. Monitor closely. 2. Acute diastolic CHF: Continue monitoring on telemetry. Continue Lasix 3. New onset A. fib with RVR: Currently in sinus rhythm. Continue Cardizem. High fall risk-no anticoagulation. Consider starting aspirin. 4. Hypernatremia: Resolved 5. Hypokalemia: Replace as needed. 6. Diarrhea: C. difficile negative. Still has rectal tube. Check GI pathogen panel PCR. 7. Macrocytic anemia: Stable. 8. Newly diagnosed type II DM/hypoglycemia: Continue sensitive SSI without at bedtime coverage. Monitor closely. 9. Acute encephalopathy: Likely secondary to acute hypercapnic respiratory failure. Resolved. 10. Bilateral lower extremity fractures and compound right tibiofibular fracture: Conservative treatment only per Dr. Glee Arvin of [place] [facility]-due to very high risk for local infection and no benefit and ultimately ability to weight bear. Followup with orthopedics in 2 weeks from 11/14/13. X-rays 8/24 look worse. Discuss with orthopedics. 11. Leukocytosis:? Secondary to steroids. Monitor closely. 12. Respiratory alkalosis: Secondary to problem #1  Code Status: Full Family Communication: None at  bedside Disposition Plan: SNF when medically stable   EVENTS/STUDIES  8/11 CT C/A/P > nonspecific infiltrate LUL, Emphysema, hepatic steatosis, porcelain gallbladder, diverticulosis  8/11 CT head/c-spine> NAICP, but extensive opacification of paranasal sinuses, C spint multifocal osteoarthritic change, no fracture  8/11 bilateral ankle films > R tib-fib fracture, left distal fibula fracture and medial malleolus  11/14/13: Normal WUA. Denies tenderness of c spine. OFf levophed. Going to OR possibly for fracture surgery  11/15/13: Failed SBT but was on sedation at this time. Ortho prefer conservative approach due to high risk local infection status.  8/13 Echo 8/12 with mixture of chronic systolic and diastolic dysfn (ef 45% with grade 1 diast dysfn)  8/18- family meeting, full code, trach wishes if needed  8/20 extubated  8/21 fib rvr, cardizem  8/23 transfer from ICU to tele  Consultants:  CCM  Orthopedics  Procedures:  Intubation-extubated 8/20  Rectal tube  Foley catheter  Antibiotics:  Completed levofloxacin 11/17/13   Subjective: Patient continues to complain of dyspnea and states that current breathing treatment regimen is not helping. Chest congested.  Objective: Filed Vitals:   11/26/13 0938 11/26/13 1147 11/26/13 1525 11/26/13 1540  BP:  143/73 125/59   Pulse:  88 91   Temp:   98.5 F (36.9 C)   TempSrc:   Oral   Resp:  20 18   Height:      Weight:      SpO2: 95% 95% 97% 97%    Intake/Output Summary (Last 24 hours) at 11/26/13 1551 Last data filed at 11/26/13 1040  Gross per 24 hour  Intake    360 ml  Output    950 ml  Net   -590 ml   Filed Weights   11/25/13 0500 11/25/13 1505 11/26/13 0537  Weight: 117.2 kg (258 lb 6.1 oz) 116.5 kg (256 lb 13.4 oz) 115.883 kg (  255 lb 7.6 oz)     Exam:  General exam: Moderately built and poorly nourished middle-aged female propped up in bed with mild respiratory distress. Respiratory system: Decreased breath  sounds bilaterally with scattered bilateral medium pitched expiratory rhonchi and occasional basal crackles. Mild increased work of breathing. Cardiovascular system: S1 & S2 heard, RRR. No JVD, murmurs, gallops, clicks. 1+ pitting bilateral leg edema. Telemetry: Sinus rhythm. Gastrointestinal system: Abdomen is nondistended, soft and nontender. Normal bowel sounds heard. Central nervous system: Alert and oriented. No focal neurological deficits. Extremities: Symmetric 5 x 5 power in upper extremities. Limited movement in lower extremity secondary to fractures. Splints on bilateral lower extremities up to knee.   Data Reviewed: Basic Metabolic Panel:  Recent Labs Lab 11/20/13 0350  11/22/13 0430 11/23/13 0500 11/24/13 0319 11/25/13 0400 11/26/13 0427  NA 152*  < > 141 144 144 141 142  K 3.7  < > 3.6* 3.5* 4.2 3.7 3.6*  CL 100  < > 85* 87* 86* 86* 87*  CO2 43*  < > >45* >45* >45* >45* >45*  GLUCOSE 198*  < > 147* 147* 108* 118* 90  BUN 41*  < > 36* 29* 26* 25* 21  CREATININE 0.55  < > 0.61 0.57 0.60 0.66 0.65  CALCIUM 9.3  < > 9.0 9.5 9.5 9.7 9.4  MG 2.1  --  1.7 2.3 2.3  --   --   PHOS 4.3  --  2.8  --  4.1  --   --   < > = values in this interval not displayed. Liver Function Tests: No results found for this basename: AST, ALT, ALKPHOS, BILITOT, PROT, ALBUMIN,  in the last 168 hours No results found for this basename: LIPASE, AMYLASE,  in the last 168 hours No results found for this basename: AMMONIA,  in the last 168 hours CBC:  Recent Labs Lab 11/20/13 0350 11/22/13 0430 11/23/13 0500 11/25/13 0400 11/26/13 0427  WBC 29.7* 28.1* 22.7* 21.8* 21.6*  NEUTROABS 24.3* 20.5* 16.3*  --   --   HGB 10.2* 9.1* 10.3* 10.4* 10.8*  HCT 34.1* 29.8* 35.5* 33.3* 35.2*  MCV 102.1* 101.0* 104.7* 99.7 101.4*  PLT 308 336 379 429* 406*   Cardiac Enzymes: No results found for this basename: CKTOTAL, CKMB, CKMBINDEX, TROPONINI,  in the last 168 hours BNP (last 3 results)  Recent  Labs  11/16/13 0210 11/18/13 0241 11/19/13 0346  PROBNP 34.5 388.9* 227.7*   CBG:  Recent Labs Lab 11/25/13 1100 11/25/13 1603 11/25/13 2216 11/26/13 0646 11/26/13 1156  GLUCAP 226* 245* 98 81 164*    Recent Results (from the past 240 hour(s))  CLOSTRIDIUM DIFFICILE BY PCR     Status: None   Collection Time    11/22/13  9:18 AM      Result Value Ref Range Status   C difficile by pcr NEGATIVE  NEGATIVE Final         Studies: Dg Chest Portable 1 View  11/24/2013   CLINICAL DATA:  63 year old female with shortness of Breath. Initial encounter.  EXAM: PORTABLE CHEST - 1 VIEW  COMPARISON:  11/21/2013 and earlier.  FINDINGS: Portable AP semi upright view at 1837 hrs. Extubated and enteric tube removed. Stable left PICC line. Stable lung volumes. Improved bibasilar ventilation, with mild residual opacity on the left. No areas of worsening ventilation Stable cardiac size and mediastinal contours.  IMPRESSION: 1. Extubated and enteric tube removed. 2. Interval improved basilar ventilation, mild residual passed be on the  left.   Electronically Signed   By: Augusto Gamble M.D.   On: 11/24/2013 19:05   Dg Ankle Left Port  11/26/2013   CLINICAL DATA:  Left ankle fracture.  EXAM: PORTABLE LEFT ANKLE - 2 VIEW  COMPARISON:  November 13, 2013.  FINDINGS: The joint has been casted and immobilized. Moderate lateral dislocation of the talus relative to the distal tibia is noted which is increased compared to prior exam. Moderately displaced oblique fractures of the distal fibula and medial malleolus are also noted which are more displaced compared to prior exam.  IMPRESSION: Increased lateral talar dislocation is noted. Moderately displaced fractures of the distal fibula and medial malleolus are also noted which are more displaced compared to prior exam.   Electronically Signed   By: Roque Lias M.D.   On: 11/26/2013 12:21   Dg Ankle Right Port  11/26/2013   CLINICAL DATA:  Right ankle fracture.  EXAM:  PORTABLE RIGHT ANKLE - 2 VIEW  COMPARISON:  November 13, 2013.  FINDINGS: The joint has been casted and immobilized. There remains moderate lateral dislocation of the talus relative to the tibia. Mildly displaced comminuted fracture involving distal fibula is noted and slightly improved compared to prior exam.  IMPRESSION: Continued moderate lateral dislocation of the talus relative to the tibia. Mildly displaced comminuted fracture of distal fibula is noted.   Electronically Signed   By: Roque Lias M.D.   On: 11/26/2013 12:24        Scheduled Meds: . antiseptic oral rinse  7 mL Mouth Rinse QID  . arformoterol  15 mcg Nebulization BID  . budesonide (PULMICORT) nebulizer solution  0.5 mg Nebulization BID  . chlorhexidine  15 mL Mouth Rinse BID  . diltiazem  60 mg Oral 3 times per day  . enoxaparin (LOVENOX) injection  60 mg Subcutaneous Q24H  . famotidine  20 mg Per Tube Q12H  . fentaNYL  50 mcg Transdermal Q72H  . furosemide  40 mg Oral Daily  . guaiFENesin  600 mg Oral BID  . insulin aspart  0-9 Units Subcutaneous TID WC  . ipratropium-albuterol  3 mL Nebulization Q4H  . methylPREDNISolone (SOLU-MEDROL) injection  40 mg Intravenous Q12H  . sodium chloride  10-40 mL Intracatheter Q12H   Continuous Infusions:   Principal Problem:   Acute respiratory failure Active Problems:   Chronic diastolic heart failure   COPD exacerbation   Acute encephalopathy   Tibia/fibula fracture   Respiratory syncytial virus (RSV) infection    Time spent: 45 minutes.    Marcellus Scott, MD, FACP, FHM. [facility] Pager [phone]  If 7PM-7AM, please contact night-coverage [email or web address] Password [facility] 11/26/2013, 3:51 PM    LOS: 13 days

## 2013-11-26 NOTE — Progress Notes (Signed)
PULMONARY / CRITICAL CARE MEDICINE   Name: AMAREE LOISEL MRN: [id number] DOB: 12-May-1950    ADMISSION DATE:  11/13/2013 CONSULTATION DATE:  11/13/2013  REFERRING MD :  [name], EDP  CHIEF COMPLAINT:  Dyspnea, trauma  INITIAL PRESENTATION:  63 y/o female folllowed by Dr Shelle Iron admitted from the [facility] ED on 8/11 with acute hypercapnic respiratory failure and bilateral lower extremity fractures.  Being managed for resp failure, poor weaning, agitation.   EVENTS/STUDIES 8/11 CT C/A/P > nonspecific infiltrate LUL, Emphysema, hepatic steatosis, porcelain gallbladder, diverticulosis 8/11 CT head/c-spine> NAICP, but extensive opacification of paranasal sinuses, C spint multifocal osteoarthritic change, no fracture 8/11 bilateral ankle films > R tib-fib fracture, left distal fibula fracture and medial malleolus 11/14/13: Normal WUA. Denies tenderness of c spine. OFf levophed. Going to OR possibly for fracture surgery 11/15/13: Failed SBT but was on sedation at this time. Ortho prefer conservative approach due to high risk local infection status.  8/13 Echo 8/12 with mixture of chronic systolic and diastolic dysfn (ef 45% with grade 1 diast dysfn) 8/18- family meeting, full code, trach wishes if needed 8/20 extubated 8/21 fib rvr, cardizem 8/23 transfer from ICU to tele  SUBJECTIVE/OVERNIGHT/INTERVAL HX: Remains SOB-States that she is congested more than usual.  RRT called yesterday PM for SOB, ABG obtained which demonstrated chronic hypercarbic resp failure.  VITAL SIGNS: Temp:  [98 F (36.7 C)-98.6 F (37 C)] 98.6 F (37 C) (08/24 0537) Pulse Rate:  [84-101] 88 (08/24 0537) Resp:  [20-26] 20 (08/24 0537) BP: (126-153)/(68-125) 137/68 mmHg (08/24 0537) SpO2:  [89 %-100 %] 95 % (08/24 0938) Weight:  [115.883 kg (255 lb 7.6 oz)-116.5 kg (256 lb 13.4 oz)] 115.883 kg (255 lb 7.6 oz) (08/24 0537) HEMODYNAMICS:   VENTILATOR SETTINGS:   INTAKE / OUTPUT:  Intake/Output Summary (Last 24  hours) at 11/26/13 1044 Last data filed at 11/26/13 1040  Gross per 24 hour  Intake    400 ml  Output   1325 ml  Net   -925 ml    PHYSICAL EXAMINATION: Gen:  No distress HEENT: mm dry, no jvd  PULM: resps even non labored on [place], diminished bases, expiratory wheezes bilaterally. CV:  IRIR Ab: BS+, soft, nontender, morbdily obese  Ext: warm, BLE splints in place Derm: diffuse dryness, with patches of dry skin, on back also lichenification Neuro : follows commands, alert  LABS: PULMONARY  Recent Labs Lab 11/21/13 1050 11/23/13 1859 11/25/13 1545  PHART 7.303* 7.469* 7.414  PCO2ART 107.0* 79.8* 75.9*  PO2ART 69.4* 67.0* 65.5*  HCO3 51.6* 57.8* 47.7*  TCO2 54.9 >50 50.0  O2SAT 90.6 92.0 90.7    CBC  Recent Labs Lab 11/23/13 0500 11/25/13 0400 11/26/13 0427  HGB 10.3* 10.4* 10.8*  HCT 35.5* 33.3* 35.2*  WBC 22.7* 21.8* 21.6*  PLT 379 429* 406*    COAGULATION No results found for this basename: INR,  in the last 168 hours  CARDIAC   No results found for this basename: TROPONINI,  in the last 168 hours No results found for this basename: PROBNP,  in the last 168 hours   CHEMISTRY  Recent Labs Lab 11/20/13 0350  11/22/13 0430 11/23/13 0500 11/24/13 0319 11/25/13 0400 11/26/13 0427  NA 152*  < > 141 144 144 141 142  K 3.7  < > 3.6* 3.5* 4.2 3.7 3.6*  CL 100  < > 85* 87* 86* 86* 87*  CO2 43*  < > >45* >45* >45* >45* >45*  GLUCOSE 198*  < >  147* 147* 108* 118* 90  BUN 41*  < > 36* 29* 26* 25* 21  CREATININE 0.55  < > 0.61 0.57 0.60 0.66 0.65  CALCIUM 9.3  < > 9.0 9.5 9.5 9.7 9.4  MG 2.1  --  1.7 2.3 2.3  --   --   PHOS 4.3  --  2.8  --  4.1  --   --   < > = values in this interval not displayed. Estimated Creatinine Clearance: 88.8 ml/min (by C-G formula based on Cr of 0.65).   LIVER No results found for this basename: AST, ALT, ALKPHOS, BILITOT, PROT, ALBUMIN, INR,  in the last 168 hours   INFECTIOUS No results found for this basename:  LATICACIDVEN, PROCALCITON,  in the last 168 hours  ENDOCRINE CBG (last 3)   Recent Labs  11/25/13 1603 11/25/13 2216 11/26/13 0646  GLUCAP 245* 98 81    IMAGING x48h Dg Chest Portable 1 View  11/24/2013   CLINICAL DATA:  63 year old female with shortness of Breath. Initial encounter.  EXAM: PORTABLE CHEST - 1 VIEW  COMPARISON:  11/21/2013 and earlier.  FINDINGS: Portable AP semi upright view at 1837 hrs. Extubated and enteric tube removed. Stable left PICC line. Stable lung volumes. Improved bibasilar ventilation, with mild residual opacity on the left. No areas of worsening ventilation Stable cardiac size and mediastinal contours.  IMPRESSION: 1. Extubated and enteric tube removed. 2. Interval improved basilar ventilation, mild residual passed be on the left.   Electronically Signed   By: Augusto Gamble M.D.   On: 11/24/2013 19:05    ASSESSMENT / PLAN:  PULMONARY OETT 8/11 >>8/20 A:  Acute hypercapnic respiratory failure most likely due to acute exacerbation of COPD from RSV B infection (pnuemonitis?) OSA P:   Aggressive pulm hygiene  OOB as able Diuresis as able Continue duoneb, pulmicort Add Brovana Change prednisone to solumedrol  q12 Continue Mucinex qhs CPAP  CARDIOVASCULAR A: volume overload -  Diastolic CHF component? - improved.  Nearing net neg.  new fib rvr P:  Continue lasix as Scr and BP tol - change to PO 8/23 Tele Cont PO cardizem  Fall risk , no anticoagulation  RENAL A:   pulm edema Hypernatremia - resolved  Hypokalemia  P:   F/u chem  Cont daily lasix  Replete K PRN   GASTROINTESTINAL A:  Nutrition  Morbid obesity  Diarrhea - CDiff neg  P:   tol PO diet  Pepcid   HEMATOLOGIC A:   Anemia P:  Lovenox  Cbc in am   INFECTIOUS A:   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 R/o cdiff 8/20 >>>neg Sputum culture 8/11 >>normal flora  resp virus panel 8/12>> RSV B P: Observe off abx - s/p 5 day course abx  ENDOCRINE A:   new dx DM Hypoglycemia  P:    SSI  Protocol - change to sensitive scale 8/23 no hs coverage hgb a1c confirms dm  NEUROLOGIC A:   Acute encephalopathy> resolved - suspect r/t hypercapnea P:   Cont low dose Fentanyl patch PT/OT  Ortho A: Bilateral LE fractures and Compound R tib-fib fracture P:  Conservative Rx only per Dr Mayra Reel of [place] due to very high risk for local infection and  No benefit in ultimate ability to weight bear [facility] in 2 weeks from 11/14/13 [facility] or [facility] will need to make this appt) PT/OT   TODAY'S SUMMARY:  Rate controlled on po cardizem, SOB persists, BD and steroid  regimen adjusted.  Aggressive pulm hygiene, mobilize.  Tx tele/[facility] 8/24.  Will need SNF at d/c.    Rutherford Guys, PA - [facility] Pgr: [phone]  or [phone]  Independently examined pt, evaluated data & formulated above care plan with NP who scribed this note & edited by me.  Oretha Milch  MD

## 2013-11-26 NOTE — Progress Notes (Signed)
Pt placed on CPAP for night.  5-20 cm H2O Auto, 4 lpm bleed-in O2 via personal FFM.  Pt currently tolerating well (HR 84, RR 20 Sats 95 BBS Dimin).  RT to monitor as needed.

## 2013-11-26 NOTE — Progress Notes (Signed)
1700 pT complained of sob O2 sat <90 appearing anxious  Skin warm and dry to touch . Anxiety me givend HOB > 45  24/m [place] maintained  Emotional support provided . Speaks clearly with incomplte sentences.  Respiratory tt provided  comfortably eating dinner. 02 sat > 94%

## 2013-11-27 ENCOUNTER — Inpatient Hospital Stay (HOSPITAL_COMMUNITY): Payer: Medicaid Other

## 2013-11-27 LAB — BASIC METABOLIC PANEL
BUN: 22 mg/dL (ref 6–23)
CO2: >45 mEq/L (ref 19–32)
CREATININE: 0.57 mg/dL (ref 0.50–1.10)
Calcium: 9.3 mg/dL (ref 8.4–10.5)
Chloride: 85 mEq/L — ABNORMAL LOW (ref 96–112)
GFR calc Af Amer: >90 mL/min (ref >90)
Glucose, Bld: 214 mg/dL — ABNORMAL HIGH (ref 70–99)
POTASSIUM: 4.7 meq/L (ref 3.7–5.3)
Sodium: 139 mEq/L (ref 137–147)

## 2013-11-27 LAB — CBC
HEMATOCRIT: 34.3 % — AB (ref 36.0–46.0)
Hemoglobin: 10.4 g/dL — ABNORMAL LOW (ref 12.0–15.0)
MCH: 30.8 pg (ref 26.0–34.0)
MCHC: 30.3 g/dL (ref 30.0–36.0)
MCV: 101.5 fL — ABNORMAL HIGH (ref 78.0–100.0)
Platelets: 402 10*3/uL — ABNORMAL HIGH (ref 150–400)
RBC: 3.38 MIL/uL — ABNORMAL LOW (ref 3.87–5.11)
RDW: 14.6 % (ref 11.5–15.5)
WBC: 20.5 10*3/uL — AB (ref 4.0–10.5)

## 2013-11-27 LAB — GLUCOSE, CAPILLARY
GLUCOSE-CAPILLARY: 164 mg/dL — AB (ref 70–99)
Glucose-Capillary: 199 mg/dL — ABNORMAL HIGH (ref 70–99)
Glucose-Capillary: 162 mg/dL — ABNORMAL HIGH (ref 70–99)
Glucose-Capillary: 263 mg/dL — ABNORMAL HIGH (ref 70–99)
Glucose-Capillary: 213 mg/dL — ABNORMAL HIGH (ref 70–99)

## 2013-11-27 LAB — GI PATHOGEN PANEL BY PCR, STOOL
C difficile toxin A/B: NEGATIVE
CAMPYLOBACTER BY PCR: NEGATIVE
CRYPTOSPORIDIUM BY PCR: NEGATIVE
E coli (ETEC) LT/ST: NEGATIVE
E coli (STEC): NEGATIVE
E coli 0157 by PCR: NEGATIVE
G lamblia by PCR: NEGATIVE
NOROVIRUS G1/G2: NEGATIVE
ROTAVIRUS A BY PCR: NEGATIVE
Salmonella by PCR: NEGATIVE
Shigella by PCR: NEGATIVE

## 2013-11-27 MED ORDER — GUAIFENESIN ER 600 MG PO TB12
1200.0000 mg | ORAL_TABLET | Freq: Two times a day (BID) | ORAL | Status: DC
  Administered 2013-11-27 – 2013-12-03 (×12): 1200 mg via ORAL
  Filled 2013-11-27 (×14): qty 2

## 2013-11-27 MED ORDER — FAMOTIDINE 20 MG PO TABS
20.0000 mg | ORAL_TABLET | Freq: Two times a day (BID) | ORAL | Status: DC
  Administered 2013-11-27 – 2013-12-03 (×12): 20 mg via ORAL
  Filled 2013-11-27 (×13): qty 1

## 2013-11-27 MED ORDER — GLUCERNA SHAKE PO LIQD
237.0000 mL | ORAL | Status: DC
Start: 2013-11-28 — End: 2013-11-28
  Administered 2013-11-28: 237 mL via ORAL

## 2013-11-27 MED ORDER — ASPIRIN EC 81 MG PO TBEC
81.0000 mg | DELAYED_RELEASE_TABLET | Freq: Every day | ORAL | Status: DC
  Administered 2013-11-28 – 2013-12-03 (×6): 81 mg via ORAL
  Filled 2013-11-27 (×7): qty 1

## 2013-11-27 MED ORDER — ASPIRIN 81 MG PO CHEW
CHEWABLE_TABLET | ORAL | Status: AC
  Administered 2013-11-27: 81 mg
  Filled 2013-11-27: qty 1

## 2013-11-27 NOTE — Progress Notes (Signed)
UR completed [name] [name], RN, BSN, MSHL, CCM  11/27/2013 5:08 PM

## 2013-11-27 NOTE — Progress Notes (Signed)
PT Cancellation Note  Patient Details Name: [name] MRN: [id number] DOB: October 02, 1950   Cancelled Treatment:    Reason Eval/Treat Not Completed: Patient unavailable--just now eating lunch and politely asked if I would return later. Will return as schedule permits   Kafi Dotter 11/27/2013, 1:27 PM Pager [phone]

## 2013-11-27 NOTE — Progress Notes (Signed)
Inpatient Diabetes Program Recommendations  AACE/ADA: New Consensus Statement on Inpatient Glycemic Control (2013)  Target Ranges:  Prepandial:   less than 140 mg/dL      Peak postprandial:   less than 180 mg/dL (1-2 hours)      Critically ill patients:  140 - 180 mg/dL  Results for [name], [name] (MRN [id number]) as of 11/27/2013 15:04  Ref. Range 11/26/2013 17:29 11/26/2013 22:06 11/27/2013 05:53 11/27/2013 08:19 11/27/2013 11:10  Glucose-Capillary Latest Range: 70-99 mg/dL 782 (H) 956 (H) 213 (H) 162 (H) 263 (H)   Consider adding Novolog meal coverage 4 units TID per Glycemic Control Order-set.  May also benefit from low dose Lantus during steroid therapy.  Thank you  Piedad Climes BSN, RN,CDE Inpatient Diabetes Coordinator [phone] (team pager)

## 2013-11-27 NOTE — Progress Notes (Addendum)
NUTRITION FOLLOW UP  Intervention:   Provide Glucerna Shake once daily RD provided and reviewed "Carbohydrate Counting for People with Diabetes" handout from the Academy of Nutrition and Dietetics.   Nutrition Dx:   Inadequate oral intake related to inability to eat as evidenced by NPO status; discontinued  Goal:   Enteral nutrition to provide 60-70% of estimated calorie needs (22-25 kcals/kg ideal body weight) and 100% of estimated protein needs, based on ASPEN guidelines for permissive underfeeding in critically ill obese individuals; discontinued  New Goal:  Pt to meet >/= 90% of their estimated nutrition needs    Monitor:   TF tolerance/adequacy, weight trend, labs, vent status, PO intake  Assessment:   63 y/o female with severe COPD and obesity hypoventilation syndrome was admitted on 8/11 from the [facility] ED. She had severe hypercapnic respiratory failure requiring mechanical ventilation. Just prior to admission she fell breaking both ankles.  Pt was extubated on 8/20 and diet was advanced to Carb Modified on 8/20. Pt now has a new diagnosis of diabetes. Pt reports usual body weight of 255 lbs. Lab Results  Component Value Date   HGBA1C 7.0* 11/23/2013   Per nursing notes, pt is eating 50-100% of meals. Receiving insulin. Gluocse ranging 81 to 263 in the past 24 hours. Pt reports poor appetite and eating very little. She reports eating very little PTA and drinking Carnation Instant Breakfast BID.  Pt curious about diet what she can eat with diabetes.  RD provided "Carbohydrate Counting for People with Diabetes" handout from the Academy of Nutrition and Dietetics. Discussed different food groups and their effects on blood sugar, emphasizing carbohydrate-containing foods. Provided list of carbohydrates and recommended serving sizes of common foods.  Discussed importance of controlled and consistent carbohydrate intake throughout the day. Provided examples of ways to balance meals/snacks  and encouraged intake of high-fiber, whole grain complex carbohydrates. Teach back method used. Reviewed patient's dietary recall and provided ways to decrease sodium and carbohydrate intake.  Expect good compliance.  Height: Ht Readings from Last 1 Encounters:  11/25/13  (1.626 m)    Weight Status:   Wt Readings from Last 1 Encounters:  11/27/13 254 lb 0.9 oz (115.24 kg)    Re-estimated needs:  Kcal: 2000-2200 Protein: 90-100 grams Fluid: 2.9 L/day  Skin: +2 RLE and LLE edema; dry, scaly skin on back  Diet Order: Carb Control   Intake/Output Summary (Last 24 hours) at 11/27/13 1514 Last data filed at 11/27/13 1404  Gross per 24 hour  Intake    745 ml  Output   1700 ml  Net   -955 ml    Last BM: 8/25   Labs:   Recent Labs Lab 11/22/13 0430 11/23/13 0500 11/24/13 0319 11/25/13 0400 11/26/13 0427 11/27/13 0525  NA 141 144 144 141 142 139  K 3.6* 3.5* 4.2 3.7 3.6* 4.7  CL 85* 87* 86* 86* 87* 85*  CO2 >45* >45* >45* >45* >45* >45*  BUN 36* 29* 26* 25* 21 22  CREATININE 0.61 0.57 0.60 0.66 0.65 0.57  CALCIUM 9.0 9.5 9.5 9.7 9.4 9.3  MG 1.7 2.3 2.3  --   --   --   PHOS 2.8  --  4.1  --   --   --   GLUCOSE 147* 147* 108* 118* 90 214*    CBG (last 3)   Recent Labs  11/27/13 0553 11/27/13 0819 11/27/13 1110  GLUCAP 199* 162* 263*    Scheduled Meds: . antiseptic oral rinse  7 mL Mouth Rinse QID  . arformoterol  15 mcg Nebulization BID  . budesonide (PULMICORT) nebulizer solution  0.5 mg Nebulization BID  . chlorhexidine  15 mL Mouth Rinse BID  . diltiazem  60 mg Oral 3 times per day  . enoxaparin (LOVENOX) injection  60 mg Subcutaneous Q24H  . famotidine  20 mg Oral BID  . fentaNYL  50 mcg Transdermal Q72H  . furosemide  40 mg Oral Daily  . guaiFENesin  600 mg Oral BID  . insulin aspart  0-9 Units Subcutaneous TID WC  . ipratropium-albuterol  3 mL Nebulization Q4H  . methylPREDNISolone (SOLU-MEDROL) injection  40 mg Intravenous Q12H  .  sodium chloride  10-40 mL Intracatheter Q12H    Continuous Infusions:   Ian Malkin RD, LDN Inpatient Clinical Dietitian Pager: [phone] After Hours Pager: [phone]

## 2013-11-27 NOTE — Clinical Social Work Placement (Addendum)
    Clinical Social Work Department CLINICAL SOCIAL WORK PLACEMENT NOTE       12/03/2013  Patient:  [name], [name]  Account Number:  [id number] Admit date:  11/13/2013  Clinical Social Worker:  Lupita Leash Keyanni Whittinghill, LCSWA  Date/time:  11/27/2013 10:50 PM  Clinical Social Work is seeking post-discharge placement for this patient at the following level of care:   SKILLED NURSING   (*CSW will update this form in Epic as items are completed)     Patient/family provided with [facility] Department of Clinical Social Work's list of facilities offering this level of care within the geographic area requested by the patient (or if unable, by the patient's family).  11/27/2013  Patient/family informed of their freedom to choose among providers that offer the needed level of care, that participate in Medicare, Medicaid or managed care program needed by the patient, have an available bed and are willing to accept the patient.    Patient/family informed of [facility]' ownership interest in [facility], as well as of the fact that they are under no obligation to receive care at this facility.  PASARR submitted to EDS on 11/27/2013 PASARR number received on 11/27/2013  FL2 transmitted to all facilities in geographic area requested by pt/family on  11/27/2013 FL2 transmitted to all facilities within larger geographic area on   Patient informed that his/her managed care company has contracts with or will negotiate with  certain facilities, including the following:   Medicaid Only     Patient/family informed of bed offers received:  12/03/2013 Patient chooses bed at [facility] [place] Physician recommends and patient chooses bed at    Patient to be transferred to [facility] [place] on   Patient to be transferred to facility by Ambulance  Sharin Mons) Patient and family notified of transfer on 12/03/2013 Name of family member notified:  Teressa Lower-- sister  The  following physician request were entered in Epic: Physician Request  Please sign FL2.  Please prepare priority discharge summary and prescriptions.    Additional Comments: 12/03/13  Ok per MD for d/c today to SNF. Nursing notified to call report.  Patient and sister were agreeable to d/c plan.  Patietn is familar with the facility so stated that he had no concerns or questions.  CSW signing off.

## 2013-11-27 NOTE — Progress Notes (Signed)
Physical Therapy Treatment Patient Details Name: [name] MRN: [id number] DOB: 1950-08-06 Today's Date: 11/27/2013    History of Present Illness Pt adm with respiratory failure and bilateral ankle fx's (went unconscious and fell in bathroom). Intubated on admission and extubated 8/20. Ankle fx's reduced and splinted as pt not good surgery candidate due to respiratory status. PMH - asthma, obesity, CHF      PT Comments    Pt limited by her shortness of breath and anxious she will become more short of breath with too much activity. Requests frequently to rest and "go slow." Pt demonstrated poor understanding of her NWB status and has been pushing through her feet to reposition herself in bed (on footboard and mattress). Pt able to verbalize and perform with assist the proper way to reposition without pushing through her legs.    Follow Up Recommendations  SNF (until able to weight bear on legs)     Equipment Recommendations  None recommended by PT    Recommendations for Other Services       Precautions / Restrictions Precautions Precautions: Fall Required Braces or Orthoses: Other Brace/Splint Other Brace/Splint: Bilateral ankle splints Restrictions RLE Weight Bearing: Non weight bearing LLE Weight Bearing: Non weight bearing    Mobility  Bed Mobility Overal bed mobility: Needs Assistance;+2 for physical assistance Bed Mobility: Rolling Rolling: Mod assist;+2 for physical assistance         General bed mobility comments: rolling multiple times for cleaning and changing linens (due to rectal tube leaking); pt initiates rolling via extension movements (pushing with UE against rail to roll away from rail and  flexes leg to put foot on bed and pushes through her foot to initiate rolling). Educated on NWB status and how to initiate rolling with flexion movements (reaching across to rail, flexing knee and lifting it "up and over")  Transfers                 General  transfer comment: not tested; pt too fatigued after multiple times of rolling Rt and lt and scooting up to [facility] (to keep her feet off foot board and maintain NWB). Discussed option of posterior transfer and pt does want to try this (especially for using [facility])  Ambulation/Gait                 Stairs            Wheelchair Mobility    Modified Rankin (Stroke Patients Only)       Balance                                    Cognition Arousal/Alertness: Awake/alert Behavior During Therapy: Anxious (especially with dyspnea) Overall Cognitive Status: No family/caregiver present to determine baseline cognitive functioning (seems a bit OCD) Area of Impairment: Safety/judgement;Problem solving         Safety/Judgement: Decreased awareness of deficits;Decreased awareness of safety   Problem Solving: Requires verbal cues;Requires tactile cues (requires visual cues)      Exercises      General Comments        Pertinent Vitals/Pain Pain Assessment: No/denies pain    Home Living                      Prior Function            PT Goals (current goals can now be found in the care  plan section) Acute Rehab PT Goals Patient Stated Goal: Return home Progress towards PT goals: Not progressing toward goals - comment (rectal tube leaking; exhausted after pericare and rolling)    Frequency  Min 3X/week    PT Plan Current plan remains appropriate    Co-evaluation             End of Session Equipment Utilized During Treatment: Oxygen Activity Tolerance: Patient limited by fatigue Patient left: in bed;with call bell/phone within reach     Time: 1424-1456 PT Time Calculation (min): 32 min  Charges:  $Therapeutic Activity: 23-37 mins                    G Codes:      Koni Kannan 12-27-13, 3:20 PM Pager [phone]

## 2013-11-27 NOTE — Progress Notes (Signed)
Patient assessed by Marcelline Deist, LCSW on 11/23/13 with referral for SNF placement for short term care.  Patient will be non-weight bearing on bilateral legs due to fractures.  She agrees to SNF placement. Barriers to placement anticipated based on Medicaid only status and limited ability to receive PT.  Active bed search will be initiated for short term SNF. Patient plans to return home with son when able to manage care again.  Fl2 completed and will be placed on chart for MD's signature.  Lorri Frederick. Jaci Lazier, [place] [phone]

## 2013-11-27 NOTE — Progress Notes (Signed)
PULMONARY / CRITICAL CARE MEDICINE   Name: [name] MRN: [id number] DOB: Oct 14, 1950    ADMISSION DATE:  11/13/2013 CONSULTATION DATE:  11/13/2013  REFERRING MD :  [name], EDP  CHIEF COMPLAINT:  Dyspnea, trauma  INITIAL PRESENTATION:  63 y/o female folllowed by Dr Shelle Iron admitted from the [facility] ED on 8/11 with acute hypercapnic respiratory failure and bilateral lower extremity fractures.  Being managed for resp failure, poor weaning, agitation.   EVENTS/STUDIES 8/11 CT C/A/P > nonspecific infiltrate LUL, Emphysema, hepatic steatosis, porcelain gallbladder, diverticulosis 8/11 CT head/c-spine> NAICP, but extensive opacification of paranasal sinuses, C spint multifocal osteoarthritic change, no fracture 8/11 bilateral ankle films > R tib-fib fracture, left distal fibula fracture and medial malleolus 11/14/13: Normal WUA. Denies tenderness of c spine. OFf levophed. Going to OR possibly for fracture surgery 11/15/13: Failed SBT but was on sedation at this time. Ortho prefer conservative approach due to high risk local infection status.  8/13 Echo 8/12 with mixture of chronic systolic and diastolic dysfn (ef 45% with grade 1 diast dysfn) 8/18- family meeting, full code, trach wishes if needed 8/20 extubated 8/21 fib rvr, cardizem 8/23 transfer from ICU to tele 8/25 did not tolerate NIMVS during the night of 8/24. Increase wob.   SUBJECTIVE/OVERNIGHT/INTERVAL HX: Remains SOB-States that she is congested more than usual.  Unable to tolerate NIMVS nocturnally. Desaturating during the night but no documentation to support this. C/O of being sob but continues to eat.  VITAL SIGNS: Temp:  [98.2 F (36.8 C)-98.6 F (37 C)] 98.6 F (37 C) (08/25 0509) Pulse Rate:  [88-120] 88 (08/25 0509) Resp:  [18-22] 22 (08/25 0509) BP: (125-143)/(59-73) 126/62 mmHg (08/25 0509) SpO2:  [94 %-99 %] 98 % (08/25 0757) Weight:  [254 lb 0.9 oz (115.24 kg)] 254 lb 0.9 oz (115.24 kg) (08/25  0509) HEMODYNAMICS:   VENTILATOR SETTINGS:   INTAKE / OUTPUT:  Intake/Output Summary (Last 24 hours) at 11/27/13 0915 Last data filed at 11/27/13 0556  Gross per 24 hour  Intake    505 ml  Output   1750 ml  Net  -1245 ml    PHYSICAL EXAMINATION: Gen:  Increase wob, congested, coughing HEENT: mm dry, no jvd  PULM: resps labored on Kappa, diminished bases, expiratory wheezes bilaterally.Increase wob CV:  IRIR Ab: BS+, soft, nontender, morbdily obese  Ext: warm, BLE splints in place Derm: diffuse dryness, with patches of dry skin,  Neuro : follows commands, alert  LABS: PULMONARY  Recent Labs Lab 11/21/13 1050 11/23/13 1859 11/25/13 1545  PHART 7.303* 7.469* 7.414  PCO2ART 107.0* 79.8* 75.9*  PO2ART 69.4* 67.0* 65.5*  HCO3 51.6* 57.8* 47.7*  TCO2 54.9 >50 50.0  O2SAT 90.6 92.0 90.7    CBC  Recent Labs Lab 11/25/13 0400 11/26/13 0427 11/27/13 0525  HGB 10.4* 10.8* 10.4*  HCT 33.3* 35.2* 34.3*  WBC 21.8* 21.6* 20.5*  PLT 429* 406* 402*    COAGULATION No results found for this basename: INR,  in the last 168 hours  CARDIAC   No results found for this basename: TROPONINI,  in the last 168 hours No results found for this basename: PROBNP,  in the last 168 hours   CHEMISTRY  Recent Labs Lab 11/22/13 0430 11/23/13 0500 11/24/13 0319 11/25/13 0400 11/26/13 0427 11/27/13 0525  NA 141 144 144 141 142 139  K 3.6* 3.5* 4.2 3.7 3.6* 4.7  CL 85* 87* 86* 86* 87* 85*  CO2 >45* >45* >45* >45* >45* >45*  GLUCOSE 147* 147* 108*  118* 90 214*  BUN 36* 29* 26* 25* 21 22  CREATININE 0.61 0.57 0.60 0.66 0.65 0.57  CALCIUM 9.0 9.5 9.5 9.7 9.4 9.3  MG 1.7 2.3 2.3  --   --   --   PHOS 2.8  --  4.1  --   --   --    Estimated Creatinine Clearance: 88.5 ml/min (by C-G formula based on Cr of 0.57).   LIVER No results found for this basename: AST, ALT, ALKPHOS, BILITOT, PROT, ALBUMIN, INR,  in the last 168 hours   INFECTIOUS No results found for this basename:  LATICACIDVEN, PROCALCITON,  in the last 168 hours  ENDOCRINE CBG (last 3)   Recent Labs  11/26/13 2206 11/27/13 0553 11/27/13 0819  GLUCAP 206* 199* 162*    IMAGING x48h Dg Ankle Left Port  11/26/2013   CLINICAL DATA:  Left ankle fracture.  EXAM: PORTABLE LEFT ANKLE - 2 VIEW  COMPARISON:  November 13, 2013.  FINDINGS: The joint has been casted and immobilized. Moderate lateral dislocation of the talus relative to the distal tibia is noted which is increased compared to prior exam. Moderately displaced oblique fractures of the distal fibula and medial malleolus are also noted which are more displaced compared to prior exam.  IMPRESSION: Increased lateral talar dislocation is noted. Moderately displaced fractures of the distal fibula and medial malleolus are also noted which are more displaced compared to prior exam.   Electronically Signed   By: Roque Lias M.D.   On: 11/26/2013 12:21   Dg Ankle Right Port  11/26/2013   CLINICAL DATA:  Right ankle fracture.  EXAM: PORTABLE RIGHT ANKLE - 2 VIEW  COMPARISON:  November 13, 2013.  FINDINGS: The joint has been casted and immobilized. There remains moderate lateral dislocation of the talus relative to the tibia. Mildly displaced comminuted fracture involving distal fibula is noted and slightly improved compared to prior exam.  IMPRESSION: Continued moderate lateral dislocation of the talus relative to the tibia. Mildly displaced comminuted fracture of distal fibula is noted.   Electronically Signed   By: Roque Lias M.D.   On: 11/26/2013 12:24    ASSESSMENT / PLAN:  PULMONARY OETT 8/11 >>8/20 A:  Acute hypercapnic respiratory failure most likely due to acute exacerbation of COPD from RSV B infection (pnuemonitis?) OSA P:   Aggressive pulm hygiene  OOB as able Diuresis as able Continue duoneb, pulmicort Add Brovana Changed prednisone to solumedrol  q12-drop back down to pred 8/26 Continue Mucinex qhs CPAP not tolerating Dc cont pulse  ox  CARDIOVASCULAR A: volume overload -  Diastolic CHF component? - improved.  Nearing net neg.  new fib rvr P:  Continue lasix as Scr and BP tol - change to PO 8/23 Tele Cont PO cardizem  Fall risk , no anticoagulation  RENAL A:   pulm edema Hypernatremia - resolved  Hypokalemia  P:   F/u chem  Cont daily lasix  Replete K PRN   GASTROINTESTINAL A:  Nutrition  Morbid obesity  Diarrhea - CDiff neg  P:   tol PO diet  Pepcid   HEMATOLOGIC A:   Anemia P:  Lovenox  Cbc in am   INFECTIOUS A:   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 R/o cdiff 8/20 >>>neg Sputum culture 8/11 >>normal flora  resp virus panel 8/12>> RSV B P: Observe off abx - s/p 5 day course abx  ENDOCRINE A:   new dx DM Hypoglycemia  P:   SSI  Protocol -  change to sensitive scale 8/23 no hs coverage hgb a1c confirms dm  NEUROLOGIC A:   Acute encephalopathy> resolved - suspect r/t hypercapnea P:   Cont low dose Fentanyl patch PT/OT  Ortho A: Bilateral LE fractures and Compound R tib-fib fracture P:  Conservative Rx only per Dr Mayra Reel of [facility] due to very high risk for local infection and  No benefit in ultimate ability to weight bear [facility] in 2 weeks from 11/14/13 [facility] or PCCM will need to make this appt) PT/OT   TODAY'S SUMMARY: COPD flare -slow improvement, poor baseline  Brett Canales Minor ACNP [facility] Pager [phone] till 3 pm If no answer page [phone]  Oretha Milch MD 11/27/2013, 9:21 AM

## 2013-11-27 NOTE — Progress Notes (Addendum)
PROGRESS NOTE    [name] AVW:[id number] DOB: 09/14/1950 DOA: 11/13/2013 PCP: No primary provider on file.  HPI/Brief narrative 63 y/o female folllowed by Dr Shelle Iron admitted from the [facility] ED on 8/11 with acute hypercapnic respiratory failure and bilateral lower extremity fractures. Admitted by CCM and transferred to floor/TRH on 11/26/13.   Assessment/Plan:  1. Acute on chronic hypercapnic respiratory failure: Secondary to COPD exacerbation from RSV B infection/? Pneumonitis: Extubated on 8/20. Completed 5 days course of antibiotics. Continues to have significant dyspnea. Pulmonary following. Changed steroids from by mouth prednisone to Solu-Medrol 40 mg every 12 and Brovana added. Continue Lasix, DuoNeb, Pulmicort, Mucinex and nightly CPAP. Monitor closely. Not tolerating CPAP qHS. High risk for decompensation. Poor baseline RS status.  CXR 8/25: no acute findings. 2. Acute diastolic CHF: Continue monitoring on telemetry. Continue Lasix 3. New onset A. fib with RVR: Currently in sinus rhythm. Continue Cardizem. High fall risk-no anticoagulation. Start ASA. CHADS2: 1 4. Hypernatremia: Resolved 5. Hypokalemia: Replace as needed. 6. Diarrhea: C. difficile negative. Still has rectal tube. GI pathogen panel PCR - pending. Loose stools. 7. Macrocytic anemia: Stable. 8. Newly diagnosed type II DM/hypoglycemia: Continue sensitive SSI without at bedtime coverage. Monitor closely. Better. 9. Acute encephalopathy: Likely secondary to acute hypercapnic respiratory failure. Resolved. 10. Bilateral lower extremity fractures and compound right tibiofibular fracture: Conservative treatment only per Dr. Glee Arvin of [place] [facility]-due to very high risk for local infection and no benefit and ultimately ability to weight bear. Followup with orthopedics in 2 weeks from 11/14/13. X-rays 8/24 look worse. Discussed with orthopedics on 8/24- no change in Rx/non weight bearing. 11. Leukocytosis:? Secondary  to steroids. Monitor closely. 12. Respiratory alkalosis: Secondary to problem #1  Code Status: Full Family Communication: None at bedside Disposition Plan: SNF when medically stable   EVENTS/STUDIES  8/11 CT C/A/P > nonspecific infiltrate LUL, Emphysema, hepatic steatosis, porcelain gallbladder, diverticulosis  8/11 CT head/c-spine> NAICP, but extensive opacification of paranasal sinuses, C spint multifocal osteoarthritic change, no fracture  8/11 bilateral ankle films > R tib-fib fracture, left distal fibula fracture and medial malleolus  11/14/13: Normal WUA. Denies tenderness of c spine. OFf levophed. Going to OR possibly for fracture surgery  11/15/13: Failed SBT but was on sedation at this time. Ortho prefer conservative approach due to high risk local infection status.  8/13 Echo 8/12 with mixture of chronic systolic and diastolic dysfn (ef 45% with grade 1 diast dysfn)  8/18- family meeting, full code, trach wishes if needed  8/20 extubated  8/21 fib rvr, cardizem  8/23 transfer from ICU to tele  Consultants:  CCM  Orthopedics  Procedures:  Intubation-extubated 8/20  Rectal tube  Foley catheter  Antibiotics:  Completed levofloxacin 11/17/13   Subjective: Patient continues to complain of dyspnea and chest congestion with difficult to bring up thick secretions.   Objective: Filed Vitals:   11/27/13 0509 11/27/13 0757 11/27/13 0932 11/27/13 1158  BP: 126/62  143/59   Pulse: 88  84   Temp: 98.6 F (37 C)     TempSrc: Oral     Resp: 22     Height:      Weight: 115.24 kg (254 lb 0.9 oz)     SpO2: 96% 98%  99%    Intake/Output Summary (Last 24 hours) at 11/27/13 1644 Last data filed at 11/27/13 1608  Gross per 24 hour  Intake    745 ml  Output   2200 ml  Net  -1455 ml  Filed Weights   11/25/13 1505 11/26/13 0537 11/27/13 0509  Weight: 116.5 kg (256 lb 13.4 oz) 115.883 kg (255 lb 7.6 oz) 115.24 kg (254 lb 0.9 oz)     Exam:  General exam: Moderately  built and poorly nourished middle-aged female propped up in bed with no respiratory distress. Looks better than 8/24 Respiratory system: Decreased breath sounds bilaterally with scattered rhonchi but less than 8/24 and occasional basal crackles. No increased work of breathing. Able to speak in full sentences. Cardiovascular system: S1 & S2 heard, RRR. No JVD, murmurs, gallops, clicks. 1+ pitting bilateral leg edema. Telemetry: Sinus rhythm. Gastrointestinal system: Abdomen is nondistended, soft and nontender. Normal bowel sounds heard. Central nervous system: Alert and oriented. No focal neurological deficits. Extremities: Symmetric 5 x 5 power in upper extremities. Limited movement in lower extremity secondary to fractures. Splints on bilateral lower extremities up to knee.   Data Reviewed: Basic Metabolic Panel:  Recent Labs Lab 11/22/13 0430 11/23/13 0500 11/24/13 0319 11/25/13 0400 11/26/13 0427 11/27/13 0525  NA 141 144 144 141 142 139  K 3.6* 3.5* 4.2 3.7 3.6* 4.7  CL 85* 87* 86* 86* 87* 85*  CO2 >45* >45* >45* >45* >45* >45*  GLUCOSE 147* 147* 108* 118* 90 214*  BUN 36* 29* 26* 25* 21 22  CREATININE 0.61 0.57 0.60 0.66 0.65 0.57  CALCIUM 9.0 9.5 9.5 9.7 9.4 9.3  MG 1.7 2.3 2.3  --   --   --   PHOS 2.8  --  4.1  --   --   --    Liver Function Tests: No results found for this basename: AST, ALT, ALKPHOS, BILITOT, PROT, ALBUMIN,  in the last 168 hours No results found for this basename: LIPASE, AMYLASE,  in the last 168 hours No results found for this basename: AMMONIA,  in the last 168 hours CBC:  Recent Labs Lab 11/22/13 0430 11/23/13 0500 11/25/13 0400 11/26/13 0427 11/27/13 0525  WBC 28.1* 22.7* 21.8* 21.6* 20.5*  NEUTROABS 20.5* 16.3*  --   --   --   HGB 9.1* 10.3* 10.4* 10.8* 10.4*  HCT 29.8* 35.5* 33.3* 35.2* 34.3*  MCV 101.0* 104.7* 99.7 101.4* 101.5*  PLT 336 379 429* 406* 402*   Cardiac Enzymes: No results found for this basename: CKTOTAL, CKMB,  CKMBINDEX, TROPONINI,  in the last 168 hours BNP (last 3 results)  Recent Labs  11/16/13 0210 11/18/13 0241 11/19/13 0346  PROBNP 34.5 388.9* 227.7*   CBG:  Recent Labs Lab 11/26/13 2206 11/27/13 0553 11/27/13 0819 11/27/13 1110 11/27/13 1606  GLUCAP 206* 199* 162* 263* 164*    Recent Results (from the past 240 hour(s))  CLOSTRIDIUM DIFFICILE BY PCR     Status: None   Collection Time    11/22/13  9:18 AM      Result Value Ref Range Status   C difficile by pcr NEGATIVE  NEGATIVE Final         Studies: Dg Chest Port 1 View  11/27/2013   CLINICAL DATA:  Increasing shortness of breath.  EXAM: PORTABLE CHEST - 1 VIEW  COMPARISON:  11/24/2013.  FINDINGS: Cardiac silhouette is normal in size. No mediastinal or hilar masses. Mild, stable left base atelectasis. Lungs are otherwise clear allowing for the semi-erect rotated positioning. No convincing pleural effusion or pneumothorax.  Left PICC tip lies near the caval atrial junction, stable and well positioned.  IMPRESSION: No acute findings.  No change from the prior study.   Electronically Signed  By: Amie Portland M.D.   On: 11/27/2013 10:33   Dg Ankle Left Port  11/26/2013   CLINICAL DATA:  Left ankle fracture.  EXAM: PORTABLE LEFT ANKLE - 2 VIEW  COMPARISON:  November 13, 2013.  FINDINGS: The joint has been casted and immobilized. Moderate lateral dislocation of the talus relative to the distal tibia is noted which is increased compared to prior exam. Moderately displaced oblique fractures of the distal fibula and medial malleolus are also noted which are more displaced compared to prior exam.  IMPRESSION: Increased lateral talar dislocation is noted. Moderately displaced fractures of the distal fibula and medial malleolus are also noted which are more displaced compared to prior exam.   Electronically Signed   By: Roque Lias M.D.   On: 11/26/2013 12:21   Dg Ankle Right Port  11/26/2013   CLINICAL DATA:  Right ankle fracture.   EXAM: PORTABLE RIGHT ANKLE - 2 VIEW  COMPARISON:  November 13, 2013.  FINDINGS: The joint has been casted and immobilized. There remains moderate lateral dislocation of the talus relative to the tibia. Mildly displaced comminuted fracture involving distal fibula is noted and slightly improved compared to prior exam.  IMPRESSION: Continued moderate lateral dislocation of the talus relative to the tibia. Mildly displaced comminuted fracture of distal fibula is noted.   Electronically Signed   By: Roque Lias M.D.   On: 11/26/2013 12:24        Scheduled Meds: . antiseptic oral rinse  7 mL Mouth Rinse QID  . arformoterol  15 mcg Nebulization BID  . budesonide (PULMICORT) nebulizer solution  0.5 mg Nebulization BID  . chlorhexidine  15 mL Mouth Rinse BID  . diltiazem  60 mg Oral 3 times per day  . enoxaparin (LOVENOX) injection  60 mg Subcutaneous Q24H  . famotidine  20 mg Oral BID  . [START ON 11/28/2013] feeding supplement (GLUCERNA SHAKE)  237 mL Oral Q24H  . fentaNYL  50 mcg Transdermal Q72H  . furosemide  40 mg Oral Daily  . guaiFENesin  600 mg Oral BID  . insulin aspart  0-9 Units Subcutaneous TID WC  . ipratropium-albuterol  3 mL Nebulization Q4H  . methylPREDNISolone (SOLU-MEDROL) injection  40 mg Intravenous Q12H  . sodium chloride  10-40 mL Intracatheter Q12H   Continuous Infusions:   Principal Problem:   Acute respiratory failure Active Problems:   Chronic diastolic heart failure   COPD exacerbation   Acute encephalopathy   Tibia/fibula fracture   Respiratory syncytial virus (RSV) infection    Time spent: 45 minutes.    Marcellus Scott, MD, FACP, FHM. [facility] Pager [phone]  If 7PM-7AM, please contact night-coverage [email or web address] Password [email or web address] 11/27/2013, 4:44 PM    LOS: 14 days

## 2013-11-28 LAB — BASIC METABOLIC PANEL
BUN: 21 mg/dL (ref 6–23)
BUN: 24 mg/dL — ABNORMAL HIGH (ref 6–23)
CALCIUM: 9.9 mg/dL (ref 8.4–10.5)
CHLORIDE: 89 meq/L — AB (ref 96–112)
CO2: >45 mEq/L (ref 19–32)
Calcium: 9.6 mg/dL (ref 8.4–10.5)
Chloride: 86 mEq/L — ABNORMAL LOW (ref 96–112)
Creatinine, Ser: 0.57 mg/dL (ref 0.50–1.10)
Creatinine, Ser: 0.59 mg/dL (ref 0.50–1.10)
GFR calc Af Amer: >90 mL/min (ref >90)
GFR calc Af Amer: >90 mL/min (ref >90)
GFR calc non Af Amer: >90 mL/min (ref >90)
GLUCOSE: 203 mg/dL — AB (ref 70–99)
GLUCOSE: 148 mg/dL — AB (ref 70–99)
POTASSIUM: 5.3 meq/L (ref 3.7–5.3)
Potassium: 4.6 mEq/L (ref 3.7–5.3)
Sodium: 135 mEq/L — ABNORMAL LOW (ref 137–147)
Sodium: 141 mEq/L (ref 137–147)

## 2013-11-28 LAB — CBC
HEMATOCRIT: 36.4 % (ref 36.0–46.0)
HEMOGLOBIN: 11.1 g/dL — AB (ref 12.0–15.0)
MCH: 31 pg (ref 26.0–34.0)
MCHC: 30.5 g/dL (ref 30.0–36.0)
MCV: 101.7 fL — ABNORMAL HIGH (ref 78.0–100.0)
Platelets: 431 10*3/uL — ABNORMAL HIGH (ref 150–400)
RBC: 3.58 MIL/uL — AB (ref 3.87–5.11)
RDW: 14.4 % (ref 11.5–15.5)
WBC: 24.3 10*3/uL — AB (ref 4.0–10.5)

## 2013-11-28 LAB — GLUCOSE, CAPILLARY
GLUCOSE-CAPILLARY: 164 mg/dL — AB (ref 70–99)
Glucose-Capillary: 201 mg/dL — ABNORMAL HIGH (ref 70–99)
Glucose-Capillary: 116 mg/dL — ABNORMAL HIGH (ref 70–99)

## 2013-11-28 MED ORDER — FENTANYL 25 MCG/HR TD PT72
50.0000 ug | MEDICATED_PATCH | TRANSDERMAL | Status: DC
  Administered 2013-11-28 – 2013-12-01 (×2): 50 ug via TRANSDERMAL
  Filled 2013-11-28 (×2): qty 2

## 2013-11-28 MED ORDER — PREDNISONE 20 MG PO TABS
40.0000 mg | ORAL_TABLET | Freq: Two times a day (BID) | ORAL | Status: DC
  Administered 2013-11-28 – 2013-11-29 (×2): 40 mg via ORAL
  Filled 2013-11-28 (×4): qty 2

## 2013-11-28 NOTE — Progress Notes (Signed)
Pt educated on importance of removing foley to decrease risk of infection. Pt refused for RN to remove foley at this time.

## 2013-11-28 NOTE — Progress Notes (Signed)
PROGRESS NOTE    [name]:[id number] DOB: [birth year] DOA: 11/13/2013 PCP: No primary provider on file.  HPI/Brief narrative 63 y/o female folllowed by Dr Shelle Iron admitted from the [facility] ED on 8/11 with acute hypercapnic respiratory failure and bilateral lower extremity fractures. Admitted by CCM and transferred to floor/TRH on 11/26/13.   Assessment/Plan:  1. Acute on chronic hypercapnic respiratory failure: Secondary to COPD exacerbation from RSV B infection/? Pneumonitis: s/p VDRF, prolonged mechanical ventilation 8/11-8/20. Completed 5 days course of antibiotics. Continues to have significant dyspnea. Pulmonary following. Changed steroids from IV solumedrol to PO and Brovana added. Continue Lasix, DuoNeb, Pulmicort, Mucinex and nightly CPAP. Monitor closely. Not tolerating CPAP qHS. High risk for decompensation. Has chronic resp failure on 3L Home O2 and baseline DYspnea with minimal activity.Poor baseline RS status.  CXR 8/25: no acute findings.  2. Acute diastolic CHF: Continue monitoring on telemetry. Continue Lasix PO now, negative 3.4L, Bmet in am  3. New onset A. fib with RVR: Currently in sinus rhythm. Continue Cardizem. High fall risk-no anticoagulation. Started ASA. CHADS2: 1  4. Hypernatremia: Resolved  5. Hypokalemia: Replace as needed.  6. Diarrhea: C. difficile negative. Still has rectal tube. GI pathogen panel PCR - pending, likely malabsorption, DC glucerna  7. Macrocytic anemia: Stable.  8. Newly diagnosed type II DM/hypoglycemia: Continue sensitive SSI without HS coverage  9. Acute encephalopathy: Likely secondary to acute hypercapnic respiratory failure. Resolved.  10. Bilateral lower extremity fractures and compound right tibiofibular fracture: Conservative treatment only per Dr. Glee Arvin of [place] [facility]-due to very high risk for local infection and no benefit and ultimately ability to weight bear. Followup with orthopedics in 2 weeks from  11/14/13. X-rays 8/24 look worse. Discussed with orthopedics on 8/24- no change in Rx/non weight bearing.  11. Leukocytosis:? Secondary to steroids. Monitor closely.  12. Respiratory alkalosis: Secondary to problem #1  DVT proph: lovenox  Code Status: Full Family Communication: None at bedside Disposition Plan: SNF, early next week if stable   EVENTS/STUDIES  8/11 CT C/A/P > nonspecific infiltrate LUL, Emphysema, hepatic steatosis, porcelain gallbladder, diverticulosis  8/11 CT head/c-spine> NAICP, but extensive opacification of paranasal sinuses, C spint multifocal osteoarthritic change, no fracture  8/11 bilateral ankle films > R tib-fib fracture, left distal fibula fracture and medial malleolus  11/14/13: Normal WUA. Denies tenderness of c spine. OFf levophed. Going to OR possibly for fracture surgery  11/15/13: Failed SBT but was on sedation at this time. Ortho prefer conservative approach due to high risk local infection status.  8/13 Echo 8/12 with mixture of chronic systolic and diastolic dysfn (ef 45% with grade 1 diast dysfn)  8/18- family meeting, full code, trach wishes if needed  8/20 extubated  8/21 fib rvr, cardizem  8/23 transfered from ICU to tele  Consultants:  CCM  Orthopedics  Procedures:  Intubation-extubated 8/20  Rectal tube  Foley catheter  Antibiotics:  Completed levofloxacin 11/17/13   Subjective: Patient continues to complain of dyspnea and chest congestion, but better from yesterday  Objective: Filed Vitals:   11/28/13 0030 11/28/13 0313 11/28/13 0522 11/28/13 0742  BP:   120/70   Pulse: 92  89   Temp:   98 F (36.7 C)   TempSrc:   Oral   Resp: 18  18   Height:      Weight:   115.1 kg (253 lb 12 oz)   SpO2: 99% 99% 95% 94%    Intake/Output Summary (Last 24 hours) at 11/28/13 1111 Last  data filed at 11/28/13 1005  Gross per 24 hour  Intake    870 ml  Output   2475 ml  Net  -1605 ml   Filed Weights   11/26/13 0537 11/27/13 0509  11/28/13 0522  Weight: 115.883 kg (255 lb 7.6 oz) 115.24 kg (254 lb 0.9 oz) 115.1 kg (253 lb 12 oz)     Exam:  General exam: Moderately built and poorly nourished middle-aged female propped up in bed with no respiratory distress. Respiratory system: Decreased breath sounds bilaterally with scattered wheezes, Able to speak in full sentences. Cardiovascular system: S1 & S2 heard, RRR. No JVD, murmurs, gallops, clicks. 1+ pitting bilateral leg edema.  Gastrointestinal system: Abdomen is nondistended, soft and nontender. Normal bowel sounds heard. Central nervous system: Alert and oriented. No focal neurological deficits. Extremities: Limited movement in lower extremity secondary to fractures. Splints on bilateral lower extremities up to knee.   Data Reviewed: Basic Metabolic Panel:  Recent Labs Lab 11/22/13 0430 11/23/13 0500 11/24/13 0319 11/25/13 0400 11/26/13 0427 11/27/13 0525 11/28/13 0525  NA 141 144 144 141 142 139 135*  K 3.6* 3.5* 4.2 3.7 3.6* 4.7 5.3  CL 85* 87* 86* 86* 87* 85* 89*  CO2 >45* >45* >45* >45* >45* >45* >45*  GLUCOSE 147* 147* 108* 118* 90 214* 203*  BUN 36* 29* 26* 25* CREATININE 0.61 0.57 0.60 0.66 0.65 0.57 0.57  CALCIUM 9.0 9.5 9.5 9.7 9.4 9.3 9.6  MG 1.7 2.3 2.3  --   --   --   --   PHOS 2.8  --  4.1  --   --   --   --    Liver Function Tests: No results found for this basename: AST, ALT, ALKPHOS, BILITOT, PROT, ALBUMIN,  in the last 168 hours No results found for this basename: LIPASE, AMYLASE,  in the last 168 hours No results found for this basename: AMMONIA,  in the last 168 hours CBC:  Recent Labs Lab 11/22/13 0430 11/23/13 0500 11/25/13 0400 11/26/13 0427 11/27/13 0525 11/28/13 0525  WBC 28.1* 22.7* 21.8* 21.6* 20.5* 24.3*  NEUTROABS 20.5* 16.3*  --   --   --   --   HGB 9.1* 10.3* 10.4* 10.8* 10.4* 11.1*  HCT 29.8* 35.5* 33.3* 35.2* 34.3* 36.4  MCV 101.0* 104.7* 99.7 101.4* 101.5* 101.7*  PLT 336 379 429* 406* 402* 431*    Cardiac Enzymes: No results found for this basename: CKTOTAL, CKMB, CKMBINDEX, TROPONINI,  in the last 168 hours BNP (last 3 results)  Recent Labs  11/16/13 0210 11/18/13 0241 11/19/13 0346  PROBNP 34.5 388.9* 227.7*   CBG:  Recent Labs Lab 11/27/13 1110 11/27/13 1606 11/27/13 2031 11/28/13 0538 11/28/13 1108  GLUCAP 263* 164* 213* 201* 164*    Recent Results (from the past 240 hour(s))  CLOSTRIDIUM DIFFICILE BY PCR     Status: None   Collection Time    11/22/13  9:18 AM      Result Value Ref Range Status   C difficile by pcr NEGATIVE  NEGATIVE Final         Studies: Dg Chest Port 1 View  11/27/2013   CLINICAL DATA:  Increasing shortness of breath.  EXAM: PORTABLE CHEST - 1 VIEW  COMPARISON:  11/24/2013.  FINDINGS: Cardiac silhouette is normal in size. No mediastinal or hilar masses. Mild, stable left base atelectasis. Lungs are otherwise clear allowing for the semi-erect rotated positioning. No convincing pleural effusion or pneumothorax.  Left  PICC tip lies near the caval atrial junction, stable and well positioned.  IMPRESSION: No acute findings.  No change from the prior study.   Electronically Signed   By: Amie Portland M.D.   On: 11/27/2013 10:33   Dg Ankle Left Port  11/26/2013   CLINICAL DATA:  Left ankle fracture.  EXAM: PORTABLE LEFT ANKLE - 2 VIEW  COMPARISON:  November 13, 2013.  FINDINGS: The joint has been casted and immobilized. Moderate lateral dislocation of the talus relative to the distal tibia is noted which is increased compared to prior exam. Moderately displaced oblique fractures of the distal fibula and medial malleolus are also noted which are more displaced compared to prior exam.  IMPRESSION: Increased lateral talar dislocation is noted. Moderately displaced fractures of the distal fibula and medial malleolus are also noted which are more displaced compared to prior exam.   Electronically Signed   By: Roque Lias M.D.   On: 11/26/2013 12:21   Dg  Ankle Right Port  11/26/2013   CLINICAL DATA:  Right ankle fracture.  EXAM: PORTABLE RIGHT ANKLE - 2 VIEW  COMPARISON:  November 13, 2013.  FINDINGS: The joint has been casted and immobilized. There remains moderate lateral dislocation of the talus relative to the tibia. Mildly displaced comminuted fracture involving distal fibula is noted and slightly improved compared to prior exam.  IMPRESSION: Continued moderate lateral dislocation of the talus relative to the tibia. Mildly displaced comminuted fracture of distal fibula is noted.   Electronically Signed   By: Roque Lias M.D.   On: 11/26/2013 12:24        Scheduled Meds: . antiseptic oral rinse  7 mL Mouth Rinse QID  . arformoterol  15 mcg Nebulization BID  . aspirin EC  81 mg Oral Daily  . budesonide (PULMICORT) nebulizer solution  0.5 mg Nebulization BID  . chlorhexidine  15 mL Mouth Rinse BID  . diltiazem  60 mg Oral 3 times per day  . enoxaparin (LOVENOX) injection  60 mg Subcutaneous Q24H  . famotidine  20 mg Oral BID  . feeding supplement (GLUCERNA SHAKE)  237 mL Oral Q24H  . fentaNYL  50 mcg Transdermal Q72H  . furosemide  40 mg Oral Daily  . guaiFENesin  1,200 mg Oral BID  . insulin aspart  0-9 Units Subcutaneous TID WC  . ipratropium-albuterol  3 mL Nebulization Q4H  . predniSONE  40 mg Oral BID WC  . sodium chloride  10-40 mL Intracatheter Q12H   Continuous Infusions:   Principal Problem:   Acute respiratory failure Active Problems:   Chronic diastolic heart failure   COPD exacerbation   Acute encephalopathy   Tibia/fibula fracture   Respiratory syncytial virus (RSV) infection    Time spent: 35 minutes.    Zannie Cove, MD [facility] Pager [phone]  If 7PM-7AM, please contact night-coverage [email or web address] Password [email or web address] 11/28/2013, 11:11 AM    LOS: 15 days

## 2013-11-28 NOTE — Progress Notes (Signed)
Inpatient Diabetes Program Recommendations  AACE/ADA: New Consensus Statement on Inpatient Glycemic Control (2013)  Target Ranges:  Prepandial:   less than 140 mg/dL      Peak postprandial:   less than 180 mg/dL (1-2 hours)      Critically ill patients:  140 - 180 mg/dL    Diabetes history: NO  Outpatient Diabetes medications: NA  Current orders for Inpatient glycemic control: Novolog 0-9 units [facility]  Inpatient Diabetes Program Recommendations Insulin - Basal: May want to consider ordering low dose basal insulin while on steroids. Insulin - Meal Coverage: While inpatient and ordered steroids, please consider ordering Novolog 3 units TID with meals since post prandial glucose is consistently elevated.  Thanks, Orlando Penner, RN, MSN, CCRN Diabetes Coordinator Inpatient Diabetes Program [phone] (Team Pager) [phone] (AP office) [phone] [facility] office)

## 2013-11-28 NOTE — Progress Notes (Signed)
PULMONARY / CRITICAL CARE MEDICINE   Name: [name] MRN: [id number] DOB: 09-05-1950    ADMISSION DATE:  11/13/2013 CONSULTATION DATE:  11/13/2013  REFERRING MD :  [name], EDP  CHIEF COMPLAINT:  Dyspnea, trauma  INITIAL PRESENTATION:  63 y/o female folllowed by Dr Shelle Iron admitted from the [facility] ED on 8/11 with acute hypercapnic respiratory failure and bilateral lower extremity fractures.  Being managed for resp failure, poor weaning, agitation.   EVENTS/STUDIES 8/11 CT C/A/P > nonspecific infiltrate LUL, Emphysema, hepatic steatosis, porcelain gallbladder, diverticulosis 8/11 CT head/c-spine> NAICP, but extensive opacification of paranasal sinuses, C spint multifocal osteoarthritic change, no fracture 8/11 bilateral ankle films > R tib-fib fracture, left distal fibula fracture and medial malleolus 11/14/13: Normal WUA. Denies tenderness of c spine. OFf levophed. Going to OR possibly for fracture surgery 11/15/13: Failed SBT but was on sedation at this time. Ortho prefer conservative approach due to high risk local infection status.  8/13 Echo 8/12 with mixture of chronic systolic and diastolic dysfn (ef 45% with grade 1 diast dysfn) 8/18- family meeting, full code, trach wishes if needed 8/20 extubated 8/21 fib rvr, cardizem 8/23 transfer from ICU to tele 8/25 did not tolerate NIMVS during the night of 8/24. Increase wob.   SUBJECTIVE/OVERNIGHT/INTERVAL HX: Remains SOB-reports this is her baseline  VITAL SIGNS: Temp:  [98 F (36.7 C)-98.4 F (36.9 C)] 98 F (36.7 C) (08/26 0522) Pulse Rate:  [74-92] 89 (08/26 0522) Resp:  [18] 18 (08/26 0522) BP: (116-150)/(59-70) 120/70 mmHg (08/26 0522) SpO2:  [94 %-100 %] 94 % (08/26 0742) Weight:  [253 lb 12 oz (115.1 kg)] 253 lb 12 oz (115.1 kg) (08/26 0522) HEMODYNAMICS:   VENTILATOR SETTINGS:   INTAKE / OUTPUT:  Intake/Output Summary (Last 24 hours) at 11/28/13 0916 Last data filed at 11/28/13 4098  Gross per 24 hour  Intake    1110 ml  Output   2175 ml  Net  -1065 ml    PHYSICAL EXAMINATION: Gen:  Increase wob, congested, coughing, reports normal for her HEENT: mm dry, no jvd  PULM: resps labored on Power, diminished bases, expiratory wheezes bilaterally.No change in wob  CV:  IRIR Ab: BS+, soft, nontender, morbdily obese  Ext: warm, BLE splints in place Derm: diffuse dryness, with patches of dry skin,  Neuro : follows commands, alert  LABS: PULMONARY  Recent Labs Lab 11/21/13 1050 11/23/13 1859 11/25/13 1545  PHART 7.303* 7.469* 7.414  PCO2ART 107.0* 79.8* 75.9*  PO2ART 69.4* 67.0* 65.5*  HCO3 51.6* 57.8* 47.7*  TCO2 54.9 >50 50.0  O2SAT 90.6 92.0 90.7    CBC  Recent Labs Lab 11/26/13 0427 11/27/13 0525 11/28/13 0525  HGB 10.8* 10.4* 11.1*  HCT 35.2* 34.3* 36.4  WBC 21.6* 20.5* 24.3*  PLT 406* 402* 431*    COAGULATION No results found for this basename: INR,  in the last 168 hours  CARDIAC   No results found for this basename: TROPONINI,  in the last 168 hours No results found for this basename: PROBNP,  in the last 168 hours   CHEMISTRY  Recent Labs Lab 11/22/13 0430 11/23/13 0500 11/24/13 0319 11/25/13 0400 11/26/13 0427 11/27/13 0525 11/28/13 0525  NA 141 144 144 141 142 139 135*  K 3.6* 3.5* 4.2 3.7 3.6* 4.7 5.3  CL 85* 87* 86* 86* 87* 85* 89*  CO2 >45* >45* >45* >45* >45* >45* >45*  GLUCOSE 147* 147* 108* 118* 90 214* 203*  BUN 36* 29* 26* 25* CREATININE  0.61 0.57 0.60 0.66 0.65 0.57 0.57  CALCIUM 9.0 9.5 9.5 9.7 9.4 9.3 9.6  MG 1.7 2.3 2.3  --   --   --   --   PHOS 2.8  --  4.1  --   --   --   --    Estimated Creatinine Clearance: 88.5 ml/min (by C-G formula based on Cr of 0.57).   LIVER No results found for this basename: AST, ALT, ALKPHOS, BILITOT, PROT, ALBUMIN, INR,  in the last 168 hours   INFECTIOUS No results found for this basename: LATICACIDVEN, PROCALCITON,  in the last 168 hours  ENDOCRINE CBG (last 3)   Recent Labs   11/27/13 1606 11/27/13 2031 11/28/13 0538  GLUCAP 164* 213* 201*    IMAGING x48h Dg Chest Port 1 View  11/27/2013   CLINICAL DATA:  Increasing shortness of breath.  EXAM: PORTABLE CHEST - 1 VIEW  COMPARISON:  11/24/2013.  FINDINGS: Cardiac silhouette is normal in size. No mediastinal or hilar masses. Mild, stable left base atelectasis. Lungs are otherwise clear allowing for the semi-erect rotated positioning. No convincing pleural effusion or pneumothorax.  Left PICC tip lies near the caval atrial junction, stable and well positioned.  IMPRESSION: No acute findings.  No change from the prior study.   Electronically Signed   By: Amie Portland M.D.   On: 11/27/2013 10:33   Dg Ankle Left Port  11/26/2013   CLINICAL DATA:  Left ankle fracture.  EXAM: PORTABLE LEFT ANKLE - 2 VIEW  COMPARISON:  November 13, 2013.  FINDINGS: The joint has been casted and immobilized. Moderate lateral dislocation of the talus relative to the distal tibia is noted which is increased compared to prior exam. Moderately displaced oblique fractures of the distal fibula and medial malleolus are also noted which are more displaced compared to prior exam.  IMPRESSION: Increased lateral talar dislocation is noted. Moderately displaced fractures of the distal fibula and medial malleolus are also noted which are more displaced compared to prior exam.   Electronically Signed   By: Roque Lias M.D.   On: 11/26/2013 12:21   Dg Ankle Right Port  11/26/2013   CLINICAL DATA:  Right ankle fracture.  EXAM: PORTABLE RIGHT ANKLE - 2 VIEW  COMPARISON:  November 13, 2013.  FINDINGS: The joint has been casted and immobilized. There remains moderate lateral dislocation of the talus relative to the tibia. Mildly displaced comminuted fracture involving distal fibula is noted and slightly improved compared to prior exam.  IMPRESSION: Continued moderate lateral dislocation of the talus relative to the tibia. Mildly displaced comminuted fracture of distal  fibula is noted.   Electronically Signed   By: Roque Lias M.D.   On: 11/26/2013 12:24    Intake/Output Summary (Last 24 hours) at 11/28/13 0919 Last data filed at 11/28/13 1610  Gross per 24 hour  Intake   1110 ml  Output   2175 ml  Net  -1065 ml    ASSESSMENT / PLAN:  PULMONARY OETT 8/11 >>8/20 A:  Acute hypercapnic respiratory failure most likely due to acute exacerbation of COPD from RSV B infection (pnuemonitis?) OSA P:   Aggressive pulm hygiene  OOB as able Diuresis would continued this, was neg 1.4 liters and tolerated overall, received pos balance in icu stay Continue duoneb, pulmicort Add Brovana Changed prednisone to solumedrol  q12-drop back down to pred 8/26 Continue Mucinex qhs CPAP not tolerating  cont pulse ox  CARDIOVASCULAR A: volume overload -  Diastolic CHF  component? - improved.  Nearing net neg.  new fib rvr P:  Continue lasix as Scr and BP tol - change to PO 8/23--> keep same dose Tele Cont PO cardizem  Fall risk , no anticoagulation  RENAL A:   pulm edema Hypernatremia - resolved  Hypokalemia - resolved, mild K elevation P:   F/u chem  Cont daily lasix  Repeat bmet in pm for K   GASTROINTESTINAL A:  Nutrition  Morbid obesity  Diarrhea - CDiff neg  P:   tol PO diet  Pepcid   HEMATOLOGIC A:   Anemia P:  Lovenox  Cbc in am   INFECTIOUS A:   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 R/o cdiff 8/20 >>>neg Sputum culture 8/11 >>normal flora  resp virus panel 8/12>> RSV B P: Observe off abx - s/p 5 day course abx  ENDOCRINE A:   new dx DM Hypoglycemia  P:   SSI  Protocol - sens  NEUROLOGIC A:   Acute encephalopathy> resolved - suspect r/t hypercapnea P:   Cont low dose Fentanyl patch PT/OT   Ortho A: Bilateral LE fractures and Compound R tib-fib fracture P:  Conservative Rx only per Dr Mayra Reel of [place] [facility] due to very high risk for local infection and  No benefit in ultimate ability to weight bear [facility] in 2  weeks from 11/14/13 [facility] or PCCM will need to make this appt) PT/OT   TODAY'S SUMMARY: COPD flare with viral RSV positive -slow improvement, poor baseline, she may be at baseline. Solumedrol dc'd and prednisone 40 mg bid started., check k in pm  Brett Canales Minor ACNP [facility] Pager [phone] till 3 pm If no answer page [phone]  I have fully examined this patient and agree with above findings.      Mcarthur Rossetti. Tyson Alias, MD, FACP Pgr: [phone] [facility]

## 2013-11-29 DIAGNOSIS — S82209B Unspecified fracture of shaft of unspecified tibia, initial encounter for open fracture type I or II: Secondary | ICD-10-CM

## 2013-11-29 DIAGNOSIS — S82409B Unspecified fracture of shaft of unspecified fibula, initial encounter for open fracture type I or II: Secondary | ICD-10-CM

## 2013-11-29 LAB — BASIC METABOLIC PANEL
BUN: 20 mg/dL (ref 6–23)
CHLORIDE: 86 meq/L — AB (ref 96–112)
CO2: >45 mEq/L (ref 19–32)
Calcium: 9.8 mg/dL (ref 8.4–10.5)
Creatinine, Ser: 0.52 mg/dL (ref 0.50–1.10)
GFR calc Af Amer: >90 mL/min (ref >90)
GFR calc non Af Amer: >90 mL/min (ref >90)
GLUCOSE: 155 mg/dL — AB (ref 70–99)
Potassium: 4.7 mEq/L (ref 3.7–5.3)
SODIUM: 140 meq/L (ref 137–147)

## 2013-11-29 LAB — CBC
HCT: 37.1 % (ref 36.0–46.0)
HEMOGLOBIN: 10.8 g/dL — AB (ref 12.0–15.0)
MCH: 30.6 pg (ref 26.0–34.0)
MCHC: 29.1 g/dL — ABNORMAL LOW (ref 30.0–36.0)
MCV: 105.1 fL — ABNORMAL HIGH (ref 78.0–100.0)
Platelets: 389 10*3/uL (ref 150–400)
RBC: 3.53 MIL/uL — ABNORMAL LOW (ref 3.87–5.11)
RDW: 14.7 % (ref 11.5–15.5)
WBC: 17.2 10*3/uL — ABNORMAL HIGH (ref 4.0–10.5)

## 2013-11-29 LAB — GLUCOSE, CAPILLARY
GLUCOSE-CAPILLARY: 217 mg/dL — AB (ref 70–99)
GLUCOSE-CAPILLARY: 158 mg/dL — AB (ref 70–99)
GLUCOSE-CAPILLARY: 174 mg/dL — AB (ref 70–99)
Glucose-Capillary: 124 mg/dL — ABNORMAL HIGH (ref 70–99)
Glucose-Capillary: 242 mg/dL — ABNORMAL HIGH (ref 70–99)

## 2013-11-29 MED ORDER — PREDNISONE 20 MG PO TABS
40.0000 mg | ORAL_TABLET | Freq: Every day | ORAL | Status: DC
  Administered 2013-11-30 – 2013-12-02 (×3): 40 mg via ORAL
  Filled 2013-11-29 (×5): qty 2

## 2013-11-29 MED ORDER — CALCIUM CARBONATE ANTACID 500 MG PO CHEW
2.0000 | CHEWABLE_TABLET | Freq: Three times a day (TID) | ORAL | Status: DC
  Administered 2013-11-29 – 2013-12-01 (×6): 400 mg via ORAL
  Filled 2013-11-29 (×10): qty 2

## 2013-11-29 NOTE — Progress Notes (Signed)
Patient stated that she thought 5cmH2O was too much so RT adjusted the auto settings to have a minimum of 4cmH2O

## 2013-11-29 NOTE — Progress Notes (Signed)
Orthopedic Tech Progress Note Patient Details:  [name] [birth year] [id number]  Ortho Devices Type of Ortho Device: Ace wrap;Post (short leg) splint;Stirrup splint Ortho Device/Splint Location: RLE Ortho Device/Splint Interventions: Application   Cammer, Mickie Bail 11/29/2013, 11:59 AM

## 2013-11-29 NOTE — Progress Notes (Signed)
Physical Therapy Treatment Patient Details Name: [name] MRN: [id number] DOB: Oct 23, 1950 Today's Date: 11/29/2013    History of Present Illness Pt adm with respiratory failure and bilateral ankle fx's (went unconscious and fell in bathroom). Intubated on admission and extubated 8/20. Ankle fx's reduced and splinted as pt not good surgery candidate due to respiratory status. PMH - asthma, obesity, CHF      PT Comments    *Assisted pt with rolling L and R multiple times for placement of bedpan, linen/gown changes due to frequent urination and BM. Pt SOB with minimal activity, on O2, respiratory just arrived for breathing tx at end of PT tx. **  Follow Up Recommendations  SNF (until able to weight bear on legs)     Equipment Recommendations  None recommended by PT    Recommendations for Other Services       Precautions / Restrictions Precautions Precautions: Fall Required Braces or Orthoses: Other Brace/Splint Other Brace/Splint: Bilateral ankle splints Restrictions Weight Bearing Restrictions: Yes RLE Weight Bearing: Non weight bearing LLE Weight Bearing: Non weight bearing    Mobility  Bed Mobility Overal bed mobility: Needs Assistance;+2 for physical assistance Bed Mobility: Rolling Rolling: Mod assist;+2 for physical assistance         General bed mobility comments: rolling multiple times for cleaning and changing linens (due to frequent urination, pt also had large BM in bedpan);  Reviewed BLE NWB status and how to initiate rolling with flexion movements (reaching across to rail, flexing knee and lifting it "up and over")  Transfers                 General transfer comment: not tested; pt too fatigued/SOB after multiple times of rolling Rt and lt  Discussed option of posterior transfer.  Ambulation/Gait                 Stairs            Wheelchair Mobility    Modified Rankin (Stroke Patients Only)       Balance                                    Cognition Arousal/Alertness: Awake/alert Behavior During Therapy: Anxious (pt anxious about frequent urination, SOB, difficulty with mobility) Overall Cognitive Status: Within Functional Limits for tasks assessed                      Exercises      General Comments        Pertinent Vitals/Pain Pain Assessment: No/denies pain    Home Living                      Prior Function            PT Goals (current goals can now be found in the care plan section) Acute Rehab PT Goals Patient Stated Goal: Return home Time For Goal Achievement: 11/30/13 Potential to Achieve Goals: Fair Progress towards PT goals: Not progressing toward goals - comment (Pt fatiged from frequent urination and large BM requiring linen/gown changes,)    Frequency  Min 3X/week    PT Plan Current plan remains appropriate    Co-evaluation             End of Session Equipment Utilized During Treatment: Oxygen Activity Tolerance: Patient limited by fatigue Patient left: in bed;with call bell/phone within reach  Time: 4098-1191 PT Time Calculation (min): 23 min  Charges:  $Therapeutic Activity: 23-37 mins                    G Codes:      Tamala Ser 11/29/2013, 12:10 PM [phone]

## 2013-11-29 NOTE — Progress Notes (Signed)
PROGRESS NOTE    [name]:[id number] DOB: 09/15/1950 DOA: 11/13/2013 PCP: No primary provider on file.  HPI/Brief narrative 63 y/o female folllowed by Dr Shelle Iron admitted from the [facility] ED on 8/11 with acute hypercapnic respiratory failure and bilateral lower extremity fractures. Admitted by CCM and transferred to floor/TRH on 11/26/13.   Assessment/Plan:  1. Acute on chronic hypercapnic respiratory failure: Secondary to COPD exacerbation from RSV B infection/? Pneumonitis: s/p VDRF, prolonged mechanical ventilation 8/11-8/20. Completed 5 days course of antibiotics. Continues to have significant dyspnea. Pulmonary following. Changed steroids from IV solumedrol to PO and Brovana added. Continue Lasix, DuoNeb, Pulmicort, Mucinex and nightly CPAP. High risk for decompensation. Has chronic resp failure on 3L Home O2 and baseline Dyspnea with minimal activity.Poor baseline RS status.  CXR 8/25: no acute findings.       -taper prednisone slowly  2. Acute diastolic CHF: Continue monitoring on telemetry. Continue Lasix PO now, negative 5.8L, Bmet in am  3. New onset A. fib with RVR: Currently in sinus rhythm. Continue Cardizem. High fall risk-no anticoagulation. Started ASA. CHADS2: 1  4. Hypernatremia: Resolved  5. Hypokalemia: Replace as needed.  6. Diarrhea: C. difficile negative. DC rectal tube. GI pathogen panel PCR - negtaive, likely malabsorption, stopped glucerna and supplements due to this  7. Macrocytic anemia: Stable.  8. Newly diagnosed type II DM/hypoglycemia: Continue sensitive SSI without HS coverage  9. Acute encephalopathy: Likely secondary to acute hypercapnic respiratory failure. Resolved.  10. Bilateral lower extremity fractures and compound right tibiofibular fracture: Conservative treatment only per Dr. Glee Arvin of [place] [facility]-due to very high risk for local infection and no benefit and ultimately ability to weight bear. Followup with orthopedics in 2  weeks from 11/14/13. X-rays 8/24 look worse. Discussed with orthopedics on 8/24- no change in Rx/non weight bearing.  11. Leukocytosis:? Secondary to steroids. improving  12. Respiratory alkalosis: Secondary to problem #1  DVT proph: lovenox  Code Status: Full Family Communication: None at bedside Disposition Plan: SNF, early next week if stable   EVENTS/STUDIES  8/11 CT C/A/P > nonspecific infiltrate LUL, Emphysema, hepatic steatosis, porcelain gallbladder, diverticulosis  8/11 CT head/c-spine> NAICP, but extensive opacification of paranasal sinuses, C spint multifocal osteoarthritic change, no fracture  8/11 bilateral ankle films > R tib-fib fracture, left distal fibula fracture and medial malleolus  11/14/13: Normal WUA. Denies tenderness of c spine. OFf levophed. Going to OR possibly for fracture surgery  11/15/13: Failed SBT but was on sedation at this time. Ortho prefer conservative approach due to high risk local infection status.  8/13 Echo 8/12 with mixture of chronic systolic and diastolic dysfn (ef 45% with grade 1 diast dysfn)  8/18- family meeting, full code, trach wishes if needed  8/20 extubated  8/21 fib rvr, cardizem  8/23 transfered from ICU to tele  Consultants:  CCM  Orthopedics  Procedures:  Intubation-extubated 8/20  Rectal tube  Foley catheter  Antibiotics:  Completed levofloxacin 11/17/13   Subjective: Patient continues to complain of dyspnea and chest congestion, but better from yesterday Wants to go home at discharge and discussed risks about this  Objective: Filed Vitals:   11/29/13 0455 11/29/13 0615 11/29/13 1041 11/29/13 1154  BP:  139/70 137/84   Pulse:  87 98   Temp:  98.3 F (36.8 C)    TempSrc:  Oral    Resp:  18    Height:      Weight:  115.8 kg (255 lb 4.7 oz)  SpO2: 97% 99%  94%    Intake/Output Summary (Last 24 hours) at 11/29/13 1312 Last data filed at 11/29/13 1610  Gross per 24 hour  Intake    840 ml  Output    2700 ml  Net  -1860 ml   Filed Weights   11/27/13 0509 11/28/13 0522 11/29/13 0615  Weight: 115.24 kg (254 lb 0.9 oz) 115.1 kg (253 lb 12 oz) 115.8 kg (255 lb 4.7 oz)     Exam:  General exam: Moderately built and poorly nourished middle-aged female propped up in bed with no respiratory distress. Respiratory system: Decreased breath sounds bilaterally with scattered wheezes, Able to speak in full sentences. Cardiovascular system: S1 & S2 heard, RRR. No JVD, murmurs, gallops, clicks. 1+ pitting bilateral leg edema.  Gastrointestinal system: Abdomen is nondistended, soft and nontender. Normal bowel sounds heard. Central nervous system: Alert and oriented. No focal neurological deficits. Extremities: Limited movement in lower extremity secondary to fractures. Splints on bilateral lower extremities up to knee.   Data Reviewed: Basic Metabolic Panel:  Recent Labs Lab 11/23/13 0500 11/24/13 0319  11/26/13 0427 11/27/13 0525 11/28/13 0525 11/28/13 1500 11/29/13 0533  NA 144 144  < > 142 139 135* 141 140  K 3.5* 4.2  < > 3.6* 4.7 5.3 4.6 4.7  CL 87* 86*  < > 87* 85* 89* 86* 86*  CO2 >45* >45*  < > >45* >45* >45* >45* >45*  GLUCOSE 147* 108*  < > 90 214* 203* 148* 155*  BUN 29* 26*  < > 24* 20  CREATININE 0.57 0.60  < > 0.65 0.57 0.57 0.59 0.52  CALCIUM 9.5 9.5  < > 9.4 9.3 9.6 9.9 9.8  MG 2.3 2.3  --   --   --   --   --   --   PHOS  --  4.1  --   --   --   --   --   --   < > = values in this interval not displayed. Liver Function Tests: No results found for this basename: AST, ALT, ALKPHOS, BILITOT, PROT, ALBUMIN,  in the last 168 hours No results found for this basename: LIPASE, AMYLASE,  in the last 168 hours No results found for this basename: AMMONIA,  in the last 168 hours CBC:  Recent Labs Lab 11/23/13 0500 11/25/13 0400 11/26/13 0427 11/27/13 0525 11/28/13 0525 11/29/13 0533  WBC 22.7* 21.8* 21.6* 20.5* 24.3* 17.2*  NEUTROABS 16.3*  --   --   --   --    --   HGB 10.3* 10.4* 10.8* 10.4* 11.1* 10.8*  HCT 35.5* 33.3* 35.2* 34.3* 36.4 37.1  MCV 104.7* 99.7 101.4* 101.5* 101.7* 105.1*  PLT 379 429* 406* 402* 431* 389   Cardiac Enzymes: No results found for this basename: CKTOTAL, CKMB, CKMBINDEX, TROPONINI,  in the last 168 hours BNP (last 3 results)  Recent Labs  11/16/13 0210 11/18/13 0241 11/19/13 0346  PROBNP 34.5 388.9* 227.7*   CBG:  Recent Labs Lab 11/28/13 1108 11/28/13 1607 11/28/13 2208 11/29/13 0627 11/29/13 1213  GLUCAP 164* 116* 217* 158* 124*    Recent Results (from the past 240 hour(s))  CLOSTRIDIUM DIFFICILE BY PCR     Status: None   Collection Time    11/22/13  9:18 AM      Result Value Ref Range Status   C difficile by pcr NEGATIVE  NEGATIVE Final         Studies: No  results found.      Scheduled Meds: . antiseptic oral rinse  7 mL Mouth Rinse QID  . arformoterol  15 mcg Nebulization BID  . aspirin EC  81 mg Oral Daily  . budesonide (PULMICORT) nebulizer solution  0.5 mg Nebulization BID  . calcium carbonate  2 tablet Oral TID  . chlorhexidine  15 mL Mouth Rinse BID  . diltiazem  60 mg Oral 3 times per day  . enoxaparin (LOVENOX) injection  60 mg Subcutaneous Q24H  . famotidine  20 mg Oral BID  . fentaNYL  50 mcg Transdermal Q72H  . furosemide  40 mg Oral Daily  . guaiFENesin  1,200 mg Oral BID  . insulin aspart  0-9 Units Subcutaneous TID WC  . ipratropium-albuterol  3 mL Nebulization Q4H  . [START ON 11/30/2013] predniSONE  40 mg Oral Q breakfast  . sodium chloride  10-40 mL Intracatheter Q12H   Continuous Infusions:   Principal Problem:   Acute respiratory failure Active Problems:   Chronic diastolic heart failure   COPD exacerbation   Acute encephalopathy   Tibia/fibula fracture   Respiratory syncytial virus (RSV) infection    Time spent: 35 minutes.    Zannie Cove, MD [facility] Pager [phone]  If 7PM-7AM, please contact  night-coverage [email or web address] Password [email or web address] 11/29/2013, 1:12 PM    LOS: 16 days

## 2013-11-29 NOTE — Progress Notes (Signed)
RT placed patient on CPAP with auto titrate of minimum 5cmH2O and maximum of 20cmH2O with 4L of O2 bled in. Patient is tolerating at this time.  RT will continue to monitor.   11/29/13 0026  BiPAP/CPAP/SIPAP  BiPAP/CPAP/SIPAP Pt Type Adult  Mask Type Full face mask (pt home mask)  Mask Size Medium  Respiratory Rate 18 breaths/min  Flow Rate 4 lpm  BiPAP/CPAP/SIPAP CPAP  Patient Home Equipment No (pt home mask)  Auto Titrate Yes (minimum 5cmH2O and maximum of 20cmH2O)  BiPAP/CPAP /SiPAP Vitals  Resp 18  SpO2 98 %  Bilateral Breath Sounds Diminished

## 2013-11-29 NOTE — Progress Notes (Signed)
PULMONARY / CRITICAL CARE MEDICINE   Name: [name] MRN: [id number] DOB: April 20, 1950    ADMISSION DATE:  11/13/2013 CONSULTATION DATE:  11/13/2013  REFERRING MD :  [name], EDP  CHIEF COMPLAINT:  Dyspnea, trauma  INITIAL PRESENTATION:  63 y/o female folllowed by Dr Shelle Iron admitted from the [facility] ED on 8/11 with acute hypercapnic respiratory failure and bilateral lower extremity fractures.  Being managed for resp failure, poor weaning, agitation.   EVENTS/STUDIES 8/11 CT C/A/P > nonspecific infiltrate LUL, Emphysema, hepatic steatosis, porcelain gallbladder, diverticulosis 8/11 CT head/c-spine> NAICP, but extensive opacification of paranasal sinuses, C spint multifocal osteoarthritic change, no fracture 8/11 bilateral ankle films > R tib-fib fracture, left distal fibula fracture and medial malleolus 11/14/13: Normal WUA. Denies tenderness of c spine. OFf levophed. Going to OR possibly for fracture surgery 11/15/13: Failed SBT but was on sedation at this time. Ortho prefer conservative approach due to high risk local infection status.  8/13 Echo 8/12 with mixture of chronic systolic and diastolic dysfn (ef 45% with grade 1 diast dysfn) 8/18- family meeting, full code, trach wishes if needed 8/20 extubated 8/21 fib rvr, cardizem 8/23 transfer from ICU to tele 8/25 did not tolerate NIMVS during the night of 8/24. Increase wob. 8/26 tolerated NIMV through the night.  SUBJECTIVE/OVERNIGHT/INTERVAL HX:  No acute events.  Tolerated NIMV overnight.  Still having some congestion and SOB that is unchanged from baseline. Ws neg with lasix  VITAL SIGNS: Temp:  [98.3 F (36.8 C)-98.7 F (37.1 C)] 98.3 F (36.8 C) (08/27 0615) Pulse Rate:  [87-90] 87 (08/27 0615) Resp:  [18] 18 (08/27 0615) BP: (128-139)/(70-75) 139/70 mmHg (08/27 0615) SpO2:  [96 %-100 %] 99 % (08/27 0615) Weight:  [115.8 kg (255 lb 4.7 oz)] 115.8 kg (255 lb 4.7 oz) (08/27 0615) HEMODYNAMICS:   VENTILATOR SETTINGS:    INTAKE / OUTPUT:  Intake/Output Summary (Last 24 hours) at 11/29/13 1023 Last data filed at 11/29/13 0700  Gross per 24 hour  Intake    480 ml  Output   2700 ml  Net  -2220 ml    PHYSICAL EXAMINATION: Gen:  Watching TV, complaining of some congestion. HEENT: mm dry, no jvd  PULM: Diminished in bases.  Reps even and unlabored. CV:  IRIR Ab: BS+, soft, nontender, morbdily obese  Ext: warm, BLE splints in place Derm: diffuse dryness, with patches of dry skin,  Neuro : follows commands, alert, no focal deficits  LABS: PULMONARY  Recent Labs Lab 11/23/13 1859 11/25/13 1545  PHART 7.469* 7.414  PCO2ART 79.8* 75.9*  PO2ART 67.0* 65.5*  HCO3 57.8* 47.7*  TCO2 >50 50.0  O2SAT 92.0 90.7    CBC  Recent Labs Lab 11/27/13 0525 11/28/13 0525 11/29/13 0533  HGB 10.4* 11.1* 10.8*  HCT 34.3* 36.4 37.1  WBC 20.5* 24.3* 17.2*  PLT 402* 431* 389    COAGULATION No results found for this basename: INR,  in the last 168 hours  CARDIAC   No results found for this basename: TROPONINI,  in the last 168 hours No results found for this basename: PROBNP,  in the last 168 hours   CHEMISTRY  Recent Labs Lab 11/23/13 0500 11/24/13 0319  11/26/13 0427 11/27/13 0525 11/28/13 0525 11/28/13 1500 11/29/13 0533  NA 144 144  < > 142 139 135* 141 140  K 3.5* 4.2  < > 3.6* 4.7 5.3 4.6 4.7  CL 87* 86*  < > 87* 85* 89* 86* 86*  CO2 >45* >45*  < > >  45* >45* >45* >45* >45*  GLUCOSE 147* 108*  < > 90 214* 203* 148* 155*  BUN 29* 26*  < > 24* 20  CREATININE 0.57 0.60  < > 0.65 0.57 0.57 0.59 0.52  CALCIUM 9.5 9.5  < > 9.4 9.3 9.6 9.9 9.8  MG 2.3 2.3  --   --   --   --   --   --   PHOS  --  4.1  --   --   --   --   --   --   < > = values in this interval not displayed. Estimated Creatinine Clearance: 88.7 ml/min (by C-G formula based on Cr of 0.52).   LIVER No results found for this basename: AST, ALT, ALKPHOS, BILITOT, PROT, ALBUMIN, INR,  in the last 168  hours   INFECTIOUS No results found for this basename: LATICACIDVEN, PROCALCITON,  in the last 168 hours  ENDOCRINE CBG (last 3)   Recent Labs  11/28/13 1607 11/28/13 2208 11/29/13 0627  GLUCAP 116* 217* 158*    IMAGING x48h No results found.  Intake/Output Summary (Last 24 hours) at 11/29/13 1023 Last data filed at 11/29/13 0700  Gross per 24 hour  Intake    480 ml  Output   2700 ml  Net  -2220 ml    ASSESSMENT / PLAN:  PULMONARY OETT 8/11 >>8/20 A:  Acute hypercapnic respiratory failure most likely due to acute exacerbation of COPD from RSV B infection (pnuemonitis?) OSA P:   Aggressive pulm hygiene  OOB as able Continue diuresis, did well with last 24 hr dosing lasix Continue duoneb, albuterol, pulmicort, brovana, mucinex. Continue prednisone  q12, reduce this in am likley qhs CPAP cont pulse ox  CARDIOVASCULAR A: volume overload -  Diastolic CHF component? - improved.  Nearing net neg.  new afib rvr P:  Continue lasix as Scr and BP tol - keep same dose Continue tele Cont PO cardizem  Fall risk , no anticoagulation  RENAL A:   Hypernatremia - resolved  Hypokalemia - resolved P:   F/u chem with neg balance daily over 1 liter Cont daily lasix   GASTROINTESTINAL A:  Nutrition  Morbid obesity  Diarrhea - CDiff neg  P:   tol PO diet  Pepcid   HEMATOLOGIC A:   Anemia P:  Lovenox  Cbc in am   INFECTIOUS A:   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 R/o cdiff 8/20 >>>neg Sputum culture 8/11 >>normal flora  resp virus panel 8/12>> RSV B P: Observe off abx - s/p 5 day course abx  ENDOCRINE A:   new dx DM Hypoglycemia  P:   SSI  Protocol - sens  NEUROLOGIC A:   Acute encephalopathy> resolved - suspect r/t hypercapnea P:   Cont low dose Fentanyl patch PT/OT   Ortho A: Bilateral LE fractures and Compound R tib-fib fracture P:  Conservative Rx only per Dr Mayra Reel of [place] [facility] due to very high risk for local infection and  No  benefit in ultimate ability to weight bear [facility] in 2 weeks from 11/14/13 [facility] or PCCM will need to make this appt) PT/OT   TODAY'S SUMMARY: COPD flare with viral RSV positive -slow improvement, poor baseline, she may be at baseline. Cont pulm hygiene and BD / steroid therapy.  Some volume overload, continuing diuresis.   Rutherford Guys, PA - [facility] Pgr: [phone]  or [phone]   Mcarthur Rossetti. Tyson Alias, MD, FACP Pgr: [phone] [facility]

## 2013-11-29 NOTE — Progress Notes (Signed)
Foley removed this AM per protocol. Pt tolerated well. Pt able to turn in bed for bedpan to be placed under patient. Pt educated on removal of foley to prevent infection and told to notify RN/NT if needing to void.

## 2013-11-30 LAB — GLUCOSE, CAPILLARY
GLUCOSE-CAPILLARY: 233 mg/dL — AB (ref 70–99)
GLUCOSE-CAPILLARY: 218 mg/dL — AB (ref 70–99)
Glucose-Capillary: 94 mg/dL (ref 70–99)
Glucose-Capillary: 196 mg/dL — ABNORMAL HIGH (ref 70–99)

## 2013-11-30 MED ORDER — DILTIAZEM HCL ER COATED BEADS 180 MG PO CP24
180.0000 mg | ORAL_CAPSULE | Freq: Every day | ORAL | Status: DC
  Administered 2013-11-30 – 2013-12-03 (×4): 180 mg via ORAL
  Filled 2013-11-30 (×4): qty 1

## 2013-11-30 NOTE — Progress Notes (Signed)
PROGRESS NOTE    [name] WUJ:[id number] DOB: 04/02/1951 DOA: 11/13/2013 PCP: No primary provider on file.  HPI/Brief narrative 63 y/o female folllowed by Dr Shelle Iron admitted from the [facility] ED on 8/11 with acute hypercapnic respiratory failure and bilateral lower extremity fractures. Admitted by CCM and transferred to floor/TRH on 11/26/13.   Assessment/Plan:  1. Acute on chronic hypercapnic respiratory failure: Secondary to COPD exacerbation from RSV B infection/? Pneumonitis: s/p VDRF, prolonged mechanical ventilation 8/11-8/20. Completed 5 days course of antibiotics. Continues to have significant dyspnea. Pulmonary following. Changed steroids from IV solumedrol to PO and Brovana added. Continue Lasix, DuoNeb, Pulmicort, Mucinex and nightly CPAP. High risk for decompensation. Has chronic resp failure on 3L Home O2 and baseline Dyspnea with minimal activity.Poor baseline RS status.  CXR 8/25: no acute findings.       -taper prednisone slowly       -still reports subjective dyspnea but suspect close to baseline  2. Acute diastolic CHF: Continue monitoring on telemetry. Continue Lasix PO now, negative 5.8L, Bmet in am, urine output not recorded 8/27  3. New onset A. fib with RVR: Currently in sinus rhythm. Continue Cardizem. High fall risk-no anticoagulation. Started ASA. CHADS2: 1  4. Hypernatremia: Resolved  5. Hypokalemia: Replace as needed.  6. Diarrhea: C. difficile negative. DC rectal tube. GI pathogen panel PCR - negtaive, likely malabsorption, stopped glucerna and supplements due to this       -improving  7. Macrocytic anemia: Stable.  8. Newly diagnosed type II DM/hypoglycemia: Continue sensitive SSI without HS coverage  9. Acute encephalopathy: Likely secondary to acute hypercapnic respiratory failure. Resolved.  10. Bilateral lower extremity fractures and compound right tibiofibular fracture: Conservative treatment only per Dr. Glee Arvin of [place] [facility]-due to  very high risk for local infection and no benefit and ultimately ability to weight bear. Followup with orthopedics in 2 weeks from 11/14/13. X-rays 8/24 look worse. Discussed with orthopedics on 8/24- no change in Rx/non weight bearing.  11. Leukocytosis:? Secondary to steroids. improving  12. Respiratory alkalosis: Secondary to problem #1  DVT proph: lovenox  Code Status: Full Family Communication: None at bedside Disposition Plan: SNF, early next week if stable   EVENTS/STUDIES  8/11 CT C/A/P > nonspecific infiltrate LUL, Emphysema, hepatic steatosis, porcelain gallbladder, diverticulosis  8/11 CT head/c-spine> NAICP, but extensive opacification of paranasal sinuses, C spint multifocal osteoarthritic change, no fracture  8/11 bilateral ankle films > R tib-fib fracture, left distal fibula fracture and medial malleolus  11/14/13: Normal WUA. Denies tenderness of c spine. OFf levophed. Going to OR possibly for fracture surgery  11/15/13: Failed SBT but was on sedation at this time. Ortho prefer conservative approach due to high risk local infection status.  8/13 Echo 8/12 with mixture of chronic systolic and diastolic dysfn (ef 45% with grade 1 diast dysfn)  8/18- family meeting, full code, trach wishes if needed  8/20 extubated  8/21 fib rvr, cardizem  8/23 transfered from ICU to tele  Consultants:  CCM  Orthopedics  Procedures:  Intubation-extubated 8/20  Rectal tube  Foley catheter  Antibiotics:  Completed levofloxacin 11/17/13   Subjective: Patient continues to complain of dyspnea and chest congestion, but better from yesterday  Objective: Filed Vitals:   11/30/13 0021 11/30/13 0306 11/30/13 0609 11/30/13 0809  BP:   115/79   Pulse: 80  101   Temp:   98.2 F (36.8 C)   TempSrc:   Oral   Resp: 18  18   Height:  Weight:   113.3 kg (249 lb 12.5 oz)   SpO2: 99% 96% 100% 96%    Intake/Output Summary (Last 24 hours) at 11/30/13 1325 Last data filed at  11/30/13 0900  Gross per 24 hour  Intake    960 ml  Output      0 ml  Net    960 ml   Filed Weights   11/28/13 0522 11/29/13 0615 11/30/13 0609  Weight: 115.1 kg (253 lb 12 oz) 115.8 kg (255 lb 4.7 oz) 113.3 kg (249 lb 12.5 oz)     Exam:  General exam: Moderately built and poorly nourished middle-aged female propped up in bed with no respiratory distress. Respiratory system: Decreased breath sounds bilaterally with scattered wheezes, Able to speak in full sentences. Cardiovascular system: S1 & S2 heard, RRR. No JVD, murmurs, gallops, clicks. 1+ pitting bilateral leg edema.  Gastrointestinal system: Abdomen is nondistended, soft and nontender. Normal bowel sounds heard. Central nervous system: Alert and oriented. No focal neurological deficits. Extremities: Limited movement in lower extremity secondary to fractures. Splints on bilateral lower extremities up to knee.   Data Reviewed: Basic Metabolic Panel:  Recent Labs Lab 11/24/13 0319  11/26/13 0427 11/27/13 0525 11/28/13 0525 11/28/13 1500 11/29/13 0533  NA 144  < > 142 139 135* 141 140  K 4.2  < > 3.6* 4.7 5.3 4.6 4.7  CL 86*  < > 87* 85* 89* 86* 86*  CO2 >45*  < > >45* >45* >45* >45* >45*  GLUCOSE 108*  < > 90 214* 203* 148* 155*  BUN 26*  < > 24* 20  CREATININE 0.60  < > 0.65 0.57 0.57 0.59 0.52  CALCIUM 9.5  < > 9.4 9.3 9.6 9.9 9.8  MG 2.3  --   --   --   --   --   --   PHOS 4.1  --   --   --   --   --   --   < > = values in this interval not displayed. Liver Function Tests: No results found for this basename: AST, ALT, ALKPHOS, BILITOT, PROT, ALBUMIN,  in the last 168 hours No results found for this basename: LIPASE, AMYLASE,  in the last 168 hours No results found for this basename: AMMONIA,  in the last 168 hours CBC:  Recent Labs Lab 11/25/13 0400 11/26/13 0427 11/27/13 0525 11/28/13 0525 11/29/13 0533  WBC 21.8* 21.6* 20.5* 24.3* 17.2*  HGB 10.4* 10.8* 10.4* 11.1* 10.8*  HCT 33.3* 35.2*  34.3* 36.4 37.1  MCV 99.7 101.4* 101.5* 101.7* 105.1*  PLT 429* 406* 402* 431* 389   Cardiac Enzymes: No results found for this basename: CKTOTAL, CKMB, CKMBINDEX, TROPONINI,  in the last 168 hours BNP (last 3 results)  Recent Labs  11/16/13 0210 11/18/13 0241 11/19/13 0346  PROBNP 34.5 388.9* 227.7*   CBG:  Recent Labs Lab 11/29/13 1213 11/29/13 1635 11/29/13 2153 11/30/13 0653 11/30/13 1053  GLUCAP 124* 242* 174* 94 196*    Recent Results (from the past 240 hour(s))  CLOSTRIDIUM DIFFICILE BY PCR     Status: None   Collection Time    11/22/13  9:18 AM      Result Value Ref Range Status   C difficile by pcr NEGATIVE  NEGATIVE Final         Studies: No results found.      Scheduled Meds: . antiseptic oral rinse  7 mL Mouth Rinse QID  . arformoterol  15 mcg Nebulization BID  . aspirin EC  81 mg Oral Daily  . budesonide (PULMICORT) nebulizer solution  0.5 mg Nebulization BID  . calcium carbonate  2 tablet Oral TID  . chlorhexidine  15 mL Mouth Rinse BID  . diltiazem  60 mg Oral 3 times per day  . enoxaparin (LOVENOX) injection  60 mg Subcutaneous Q24H  . famotidine  20 mg Oral BID  . fentaNYL  50 mcg Transdermal Q72H  . furosemide  40 mg Oral Daily  . guaiFENesin  1,200 mg Oral BID  . insulin aspart  0-9 Units Subcutaneous TID WC  . ipratropium-albuterol  3 mL Nebulization Q4H  . predniSONE  40 mg Oral Q breakfast  . sodium chloride  10-40 mL Intracatheter Q12H   Continuous Infusions:   Principal Problem:   Acute respiratory failure Active Problems:   Chronic diastolic heart failure   COPD exacerbation   Acute encephalopathy   Tibia/fibula fracture   Respiratory syncytial virus (RSV) infection    Time spent: 25 minutes.    Zannie Cove, MD [facility] Pager [phone]  If 7PM-7AM, please contact night-coverage [email or web address] Password [email or web address] 11/30/2013, 1:25 PM    LOS: 17 days

## 2013-11-30 NOTE — Progress Notes (Addendum)
PULMONARY / CRITICAL CARE MEDICINE   Name: [name] MRN: [id number] DOB: 1950-05-28    ADMISSION DATE:  11/13/2013 CONSULTATION DATE:  11/13/2013  REFERRING MD :  [name], EDP  CHIEF COMPLAINT:  Dyspnea, trauma  INITIAL PRESENTATION:  63 y/o female folllowed by Dr Shelle Iron admitted from the [facility] ED on 8/11 with acute hypercapnic respiratory failure and bilateral lower extremity fractures.  Being managed for resp failure, poor weaning, agitation.   EVENTS/STUDIES 8/11 CT C/A/P > nonspecific infiltrate LUL, Emphysema, hepatic steatosis, porcelain gallbladder, diverticulosis 8/11 CT head/c-spine> NAICP, but extensive opacification of paranasal sinuses, C spint multifocal osteoarthritic change, no fracture 8/11 bilateral ankle films > R tib-fib fracture, left distal fibula fracture and medial malleolus 11/14/13: Normal WUA. Denies tenderness of c spine. OFf levophed. Going to OR possibly for fracture surgery 11/15/13: Failed SBT but was on sedation at this time. Ortho prefer conservative approach due to high risk local infection status.  8/13 Echo 8/12 with mixture of chronic systolic and diastolic dysfn (ef 45% with grade 1 diast dysfn) 8/18- family meeting, full code, trach wishes if needed 8/20 extubated 8/21 fib rvr, cardizem 8/23 transfer from ICU to tele 8/25 did not tolerate NIMVS during the night of 8/24. Increase wob. 8/26 tolerated NIMV through the night.  SUBJECTIVE/OVERNIGHT/INTERVAL HX:  No acute events.  More concerned over her legs than breathing  VITAL SIGNS: Temp:  [98 F (36.7 C)-98.2 F (36.8 C)] 98.2 F (36.8 C) (08/28 0609) Pulse Rate:  [80-101] 101 (08/28 0609) Resp:  [18-22] 18 (08/28 0609) BP: (115-140)/(72-84) 115/79 mmHg (08/28 0609) SpO2:  [94 %-100 %] 96 % (08/28 0809) FiO2 (%):  [36 %] 36 % (08/28 0809) Weight:  [249 lb 12.5 oz (113.3 kg)] 249 lb 12.5 oz (113.3 kg) (08/28 0609) HEMODYNAMICS:   VENTILATOR SETTINGS: Vent Mode:  [-]  FiO2 (%):  [36  %] 36 % INTAKE / OUTPUT:  Intake/Output Summary (Last 24 hours) at 11/30/13 1023 Last data filed at 11/30/13 0900  Gross per 24 hour  Intake   1200 ml  Output      0 ml  Net   1200 ml    PHYSICAL EXAMINATION: Gen:  Watching TV, complaining of some congestion. HEENT: mm dry, no jvd  PULM: Diminished in bases.  Reps even and unlabored. CV:  IRIR Ab: BS+, soft, nontender, morbdily obese , eating well Ext: warm, BLE splints in place Derm: diffuse dryness, with patches of dry skin,  Neuro : follows commands, alert, no focal deficits  LABS: PULMONARY  Recent Labs Lab 11/23/13 1859 11/25/13 1545  PHART 7.469* 7.414  PCO2ART 79.8* 75.9*  PO2ART 67.0* 65.5*  HCO3 57.8* 47.7*  TCO2 >50 50.0  O2SAT 92.0 90.7    CBC  Recent Labs Lab 11/27/13 0525 11/28/13 0525 11/29/13 0533  HGB 10.4* 11.1* 10.8*  HCT 34.3* 36.4 37.1  WBC 20.5* 24.3* 17.2*  PLT 402* 431* 389    COAGULATION No results found for this basename: INR,  in the last 168 hours  CARDIAC   No results found for this basename: TROPONINI,  in the last 168 hours No results found for this basename: PROBNP,  in the last 168 hours   CHEMISTRY  Recent Labs Lab 11/24/13 0319  11/26/13 0427 11/27/13 0525 11/28/13 0525 11/28/13 1500 11/29/13 0533  NA 144  < > 142 139 135* 141 140  K 4.2  < > 3.6* 4.7 5.3 4.6 4.7  CL 86*  < > 87* 85* 89* 86* 86*  CO2 >45*  < > >45* >45* >45* >45* >45*  GLUCOSE 108*  < > 90 214* 203* 148* 155*  BUN 26*  < > 24* 20  CREATININE 0.60  < > 0.65 0.57 0.57 0.59 0.52  CALCIUM 9.5  < > 9.4 9.3 9.6 9.9 9.8  MG 2.3  --   --   --   --   --   --   PHOS 4.1  --   --   --   --   --   --   < > = values in this interval not displayed. Estimated Creatinine Clearance: 87.6 ml/min (by C-G formula based on Cr of 0.52).   LIVER No results found for this basename: AST, ALT, ALKPHOS, BILITOT, PROT, ALBUMIN, INR,  in the last 168 hours   INFECTIOUS No results found for this  basename: LATICACIDVEN, PROCALCITON,  in the last 168 hours  ENDOCRINE CBG (last 3)   Recent Labs  11/29/13 1635 11/29/13 2153 11/30/13 0653  GLUCAP 242* 174* 94    IMAGING x48h No results found.  Intake/Output Summary (Last 24 hours) at 11/30/13 1023 Last data filed at 11/30/13 0900  Gross per 24 hour  Intake   1200 ml  Output      0 ml  Net   1200 ml    ASSESSMENT / PLAN:  PULMONARY OETT 8/11 >>8/20 A:  Acute hypercapnic respiratory failure most likely due to acute exacerbation of COPD from RSV B infection (pnuemonitis?) OSA Back to baseline resp status P:   OOB as able Continue diuresis,note +i/o Continue duoneb, albuterol, pulmicort, brovana, mucinex. Change  prednisone  qd, would reduce this to off over 10 days qhs CPAP cont pulse ox Some sob =with exertion, add is, likley atx related  CARDIOVASCULAR A: volume overload -  Diastolic CHF component? - improved.  Nearing net neg.  new afib rvr P:  Continue lasix as Scr and BP tol - keep same dose Continue tele Cont PO cardizem  Fall risk , no anticoagulation  HEMATOLOGIC A:   Anemia P:  Lovenox important with such high risk dvt/pe Cbc in am   INFECTIOUS A:   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 R/o cdiff 8/20 >>>neg Sputum culture 8/11 >>normal flora  resp virus panel 8/12>> RSV B P: Per IM  ENDOCRINE A:   new dx DM Hypoglycemia  P:   SSI  Protocol - sens  NEUROLOGIC A:   Acute encephalopathy> resolved - suspect r/t hypercapnea P:   Cont low dose Fentanyl patch PT/OT active  Ortho A: Bilateral LE fractures and Compound R tib-fib fracture P:  Conservative Rx only per Dr Mayra Reel of [facility] due to very high risk for local infection and  No benefit in ultimate ability to weight bear [facility] in 2 weeks from 11/14/13 [facility] or PCCM will need to make this appt) PT/OT   TODAY'S SUMMARY: COPD flare with viral RSV positive -slow improvement, poor baseline, she may be at baseline. Cont pulm  hygiene and BD / steroid therapy.  Some volume overload, continuing diuresis. PCCM will sign off, see recs pred taper, needs to see Dr Shelle Iron in 1 week post dc   Brett Canales Minor ACNP [facility] Pager [phone] till 3 pm If no answer page [phone] 11/30/2013, 10:28 AM  I have fully examined this patient and agree with above findings.      Mcarthur Rossetti. Tyson Alias, MD, FACP Pgr: [phone] [facility]

## 2013-11-30 NOTE — Progress Notes (Signed)
Pt placed on auto titrate CPAP with home mask and 4L bleed in.

## 2013-12-01 LAB — CBC
HCT: 35.9 % — ABNORMAL LOW (ref 36.0–46.0)
HEMOGLOBIN: 10.8 g/dL — AB (ref 12.0–15.0)
MCH: 29.8 pg (ref 26.0–34.0)
MCHC: 30.1 g/dL (ref 30.0–36.0)
MCV: 99.2 fL (ref 78.0–100.0)
Platelets: 337 10*3/uL (ref 150–400)
RBC: 3.62 MIL/uL — ABNORMAL LOW (ref 3.87–5.11)
RDW: 14.5 % (ref 11.5–15.5)
WBC: 20.7 10*3/uL — ABNORMAL HIGH (ref 4.0–10.5)

## 2013-12-01 LAB — GLUCOSE, CAPILLARY
GLUCOSE-CAPILLARY: 98 mg/dL (ref 70–99)
GLUCOSE-CAPILLARY: 211 mg/dL — AB (ref 70–99)
GLUCOSE-CAPILLARY: 270 mg/dL — AB (ref 70–99)
Glucose-Capillary: 153 mg/dL — ABNORMAL HIGH (ref 70–99)

## 2013-12-01 LAB — BASIC METABOLIC PANEL
BUN: 19 mg/dL (ref 6–23)
Calcium: 9.6 mg/dL (ref 8.4–10.5)
Chloride: 88 mEq/L — ABNORMAL LOW (ref 96–112)
Creatinine, Ser: 0.56 mg/dL (ref 0.50–1.10)
GFR calc Af Amer: >90 mL/min (ref >90)
GFR calc non Af Amer: >90 mL/min (ref >90)
Glucose, Bld: 119 mg/dL — ABNORMAL HIGH (ref 70–99)
Potassium: 3.6 mEq/L — ABNORMAL LOW (ref 3.7–5.3)
SODIUM: 141 meq/L (ref 137–147)

## 2013-12-01 MED ORDER — PHENOL 1.4 % MT LIQD
1.0000 | OROMUCOSAL | Status: DC | PRN
  Filled 2013-12-01: qty 177

## 2013-12-01 MED ORDER — PANTOPRAZOLE SODIUM 40 MG PO TBEC
40.0000 mg | DELAYED_RELEASE_TABLET | Freq: Every day | ORAL | Status: DC
  Administered 2013-12-01 – 2013-12-03 (×3): 40 mg via ORAL
  Filled 2013-12-01 (×3): qty 1

## 2013-12-01 MED ORDER — IPRATROPIUM-ALBUTEROL 0.5-2.5 (3) MG/3ML IN SOLN
3.0000 mL | Freq: Four times a day (QID) | RESPIRATORY_TRACT | Status: DC
  Administered 2013-12-01 – 2013-12-03 (×7): 3 mL via RESPIRATORY_TRACT
  Filled 2013-12-01 (×8): qty 3

## 2013-12-01 NOTE — Clinical Social Work Note (Signed)
CSW continues to follow this patient for d/c planning. CSW contacted patient's sister Teressa Lower) and discussed current bed offers: [name] [facility] and [facility] Per sister, she and family are aware of bed offers, however, she is more concerned with actual d/c date so she can pick up patient's belongings from hospital. CSW informed sister she will have to speak with RN who communicates with MD daily. CSW to follow tomorrow for d/c plan.   Taniela Feltus Patrick-Jefferson, LCSWA Weekend Clinical Social Worker [phone]

## 2013-12-01 NOTE — Progress Notes (Signed)
PROGRESS NOTE    [name] SAY:[id number] DOB: 22-Aug-1950 DOA: 11/13/2013 PCP: No primary provider on file.  HPI/Brief narrative 63 y/o female folllowed by Dr Shelle Iron admitted from the [facility] ED on 8/11 with acute hypercapnic respiratory failure and bilateral lower extremity fractures. Admitted by CCM and transferred to floor/TRH on 11/26/13.   Assessment/Plan:  1. Acute on chronic hypercapnic respiratory failure: Secondary to COPD exacerbation from RSV B infection/? Pneumonitis: s/p VDRF, prolonged mechanical ventilation 8/11-8/20. Completed 5 days course of antibiotics.       -followed by Pulm now signed off      - Changed steroids from IV solumedrol to Prednisone and Brovana.      - Continue Lasix, DuoNeb, Pulmicort, Mucinex and nightly CPAP.       - Has chronic resp failure on 3L Home O2 and baseline Dyspnea with minimal activity.Poor baseline RS status.  CXR 8/25: no acute findings.       -taper prednisone slowly       -still reports subjective dyspnea but suspect close to baseline  2. Acute diastolic CHF: Continue monitoring on telemetry. Continue Lasix PO now, was negative 5.8L, but urine output not recorded 8/27 and 8/28  3. New onset A. fib with RVR: Currently in sinus rhythm. Continue Cardizem. High fall risk-no anticoagulation. Started ASA. CHADS2: 1  4. Hypernatremia: Resolved  5. Hypokalemia: Replace as needed.  6. Diarrhea: C. difficile negative. DC rectal tube. GI pathogen panel PCR - negtaive, likely malabsorption, stopped glucerna and supplements due to this       -improving  7. Macrocytic anemia: Stable.  8. Newly diagnosed type II DM/hypoglycemia: Continue sensitive SSI without HS coverage  9. Acute encephalopathy: Likely secondary to acute hypercapnic respiratory failure. Resolved.  10. Bilateral lower extremity fractures and compound right tibiofibular fracture: Conservative treatment only per Dr. Glee Arvin of [place] [facility]-due to very high risk for  local infection and no benefit and ultimately ability to weight bear. Followup with orthopedics in 2 weeks from 11/14/13. X-rays 8/24 look worse. Discussed with orthopedics on 8/24- no change in Rx/non weight bearing.  11. Leukocytosis:? Secondary to steroids. improving  12. Respiratory alkalosis: Secondary to problem #1  DVT proph: lovenox  Code Status: Full Family Communication: None at bedside Disposition Plan: SNF, Monday or tuesday   EVENTS/STUDIES  8/11 CT C/A/P > nonspecific infiltrate LUL, Emphysema, hepatic steatosis, porcelain gallbladder, diverticulosis  8/11 CT head/c-spine> NAICP, but extensive opacification of paranasal sinuses, C spint multifocal osteoarthritic change, no fracture  8/11 bilateral ankle films > R tib-fib fracture, left distal fibula fracture and medial malleolus  11/14/13: Normal WUA. Denies tenderness of c spine. OFf levophed. Going to OR possibly for fracture surgery  11/15/13: Failed SBT but was on sedation at this time. Ortho prefer conservative approach due to high risk local infection status.  8/13 Echo 8/12 with mixture of chronic systolic and diastolic dysfn (ef 45% with grade 1 diast dysfn)  8/18- family meeting, full code, trach wishes if needed  8/20 extubated  8/21 fib rvr, cardizem  8/23 transfered from ICU to tele  Consultants:  CCM  Orthopedics  Procedures:  Intubation-extubated 8/20  Rectal tube  Foley catheter  Antibiotics:  Completed levofloxacin 11/17/13   Subjective: Patient continues to complain of dyspnea and chest congestion, but better from yesterday  Objective: Filed Vitals:   12/01/13 0418 12/01/13 0514 12/01/13 0900 12/01/13 1038  BP:  115/57  118/74  Pulse:  109    Temp:  98.1 F (  36.7 C)    TempSrc:  Oral    Resp:  19    Height:      Weight:  113 kg (249 lb 1.9 oz)    SpO2: 96% 100% 90%     Intake/Output Summary (Last 24 hours) at 12/01/13 1143 Last data filed at 12/01/13 0900  Gross per 24 hour    Intake    630 ml  Output      0 ml  Net    630 ml   Filed Weights   11/29/13 0615 11/30/13 0609 12/01/13 0514  Weight: 115.8 kg (255 lb 4.7 oz) 113.3 kg (249 lb 12.5 oz) 113 kg (249 lb 1.9 oz)     Exam:  General exam: Moderately built and poorly nourished middle-aged female propped up in bed with no respiratory distress. Respiratory system: Decreased breath sounds bilaterally with scattered wheezes, Able to speak in full sentences. Cardiovascular system: S1 & S2 heard, RRR. No JVD, murmurs, gallops, clicks. 1+ pitting bilateral leg edema.  Gastrointestinal system: Abdomen is nondistended, soft and nontender. Normal bowel sounds heard. Central nervous system: Alert and oriented. No focal neurological deficits. Extremities: Limited movement in lower extremity secondary to fractures. Splints on bilateral lower extremities up to knee.   Data Reviewed: Basic Metabolic Panel:  Recent Labs Lab 11/27/13 0525 11/28/13 0525 11/28/13 1500 11/29/13 0533 12/01/13 0526  NA 139 135* 141 140 141  K 4.7 5.3 4.6 4.7 3.6*  CL 85* 89* 86* 86* 88*  CO2 >45* >45* >45* >45* >45*  GLUCOSE 214* 203* 148* 155* 119*  BUN 22 21 24* 20 19  CREATININE 0.57 0.57 0.59 0.52 0.56  CALCIUM 9.3 9.6 9.9 9.8 9.6   Liver Function Tests: No results found for this basename: AST, ALT, ALKPHOS, BILITOT, PROT, ALBUMIN,  in the last 168 hours No results found for this basename: LIPASE, AMYLASE,  in the last 168 hours No results found for this basename: AMMONIA,  in the last 168 hours CBC:  Recent Labs Lab 11/26/13 0427 11/27/13 0525 11/28/13 0525 11/29/13 0533 12/01/13 0526  WBC 21.6* 20.5* 24.3* 17.2* 20.7*  HGB 10.8* 10.4* 11.1* 10.8* 10.8*  HCT 35.2* 34.3* 36.4 37.1 35.9*  MCV 101.4* 101.5* 101.7* 105.1* 99.2  PLT 406* 402* 431* 389 337   Cardiac Enzymes: No results found for this basename: CKTOTAL, CKMB, CKMBINDEX, TROPONINI,  in the last 168 hours BNP (last 3 results)  Recent Labs   11/16/13 0210 11/18/13 0241 11/19/13 0346  PROBNP 34.5 388.9* 227.7*   CBG:  Recent Labs Lab 11/30/13 0653 11/30/13 1053 11/30/13 1609 11/30/13 2051 12/01/13 0655  GLUCAP 94 196* 233* 218* 98    Recent Results (from the past 240 hour(s))  CLOSTRIDIUM DIFFICILE BY PCR     Status: None   Collection Time    11/22/13  9:18 AM      Result Value Ref Range Status   C difficile by pcr NEGATIVE  NEGATIVE Final         Studies: No results found.      Scheduled Meds: . antiseptic oral rinse  7 mL Mouth Rinse QID  . arformoterol  15 mcg Nebulization BID  . aspirin EC  81 mg Oral Daily  . budesonide (PULMICORT) nebulizer solution  0.5 mg Nebulization BID  . chlorhexidine  15 mL Mouth Rinse BID  . diltiazem  180 mg Oral Daily  . enoxaparin (LOVENOX) injection  60 mg Subcutaneous Q24H  . famotidine  20 mg Oral BID  .  fentaNYL  50 mcg Transdermal Q72H  . furosemide  40 mg Oral Daily  . guaiFENesin  1,200 mg Oral BID  . insulin aspart  0-9 Units Subcutaneous TID WC  . ipratropium-albuterol  3 mL Nebulization Q6H  . pantoprazole  40 mg Oral Q1200  . predniSONE  40 mg Oral Q breakfast  . sodium chloride  10-40 mL Intracatheter Q12H   Continuous Infusions:   Principal Problem:   Acute respiratory failure Active Problems:   Chronic diastolic heart failure   COPD exacerbation   Acute encephalopathy   Tibia/fibula fracture   Respiratory syncytial virus (RSV) infection    Time spent: 25 minutes.    Zannie Cove, MD [facility] Pager [phone]  If 7PM-7AM, please contact night-coverage [email or web address] Password [email or web address] 12/01/2013, 11:43 AM    LOS: 18 days

## 2013-12-01 NOTE — Progress Notes (Signed)
Pt. With critical CO2 this am >45. On call MD, Mitchel Honour made aware via text page. RN will inform oncoming RN of results.

## 2013-12-01 NOTE — Progress Notes (Signed)
Pt on auto titrate CPAP with home mask and 4L O2 bleed in.

## 2013-12-01 NOTE — Plan of Care (Signed)
Problem: Phase II Progression Outcomes Goal: Dyspnea controlled w/progressive activity Outcome: Progressing Client is short of breath, however, this is baseline for her at this point of disease process.  Able to verbalize needs without difficulty.

## 2013-12-02 LAB — CBC
HCT: 36.2 % (ref 36.0–46.0)
Hemoglobin: 10.9 g/dL — ABNORMAL LOW (ref 12.0–15.0)
MCH: 30.6 pg (ref 26.0–34.0)
MCHC: 30.1 g/dL (ref 30.0–36.0)
MCV: 101.7 fL — ABNORMAL HIGH (ref 78.0–100.0)
PLATELETS: 279 10*3/uL (ref 150–400)
RBC: 3.56 MIL/uL — ABNORMAL LOW (ref 3.87–5.11)
RDW: 14.4 % (ref 11.5–15.5)
WBC: 17.9 10*3/uL — ABNORMAL HIGH (ref 4.0–10.5)

## 2013-12-02 LAB — GLUCOSE, CAPILLARY
GLUCOSE-CAPILLARY: 289 mg/dL — AB (ref 70–99)
Glucose-Capillary: 108 mg/dL — ABNORMAL HIGH (ref 70–99)
Glucose-Capillary: 187 mg/dL — ABNORMAL HIGH (ref 70–99)
Glucose-Capillary: 159 mg/dL — ABNORMAL HIGH (ref 70–99)

## 2013-12-02 MED ORDER — PREDNISONE 20 MG PO TABS
30.0000 mg | ORAL_TABLET | Freq: Every day | ORAL | Status: DC
  Administered 2013-12-03: 30 mg via ORAL
  Filled 2013-12-02 (×2): qty 1

## 2013-12-02 NOTE — Clinical Social Work Note (Signed)
CSW continues to follow patient for d/c planning needs. CSW spoke with patient's RN Raynelle Fanning, who stated patient to be d/c on 8/31. CSW made Tammy of [facility] aware. CSW to follow tomorrow.   Loren Vicens Patrick-Jefferson, LCSWA Weekend Clinical Social Worker [phone]

## 2013-12-02 NOTE — Progress Notes (Addendum)
PROGRESS NOTE    [name] ZOX:[id number] DOB: 1950-12-03 DOA: 11/13/2013 PCP: No primary provider on file.  HPI/Brief narrative 63 y/o female folllowed by Dr Shelle Iron admitted from the [facility] ED on 8/11 with acute hypercapnic respiratory failure and bilateral lower extremity fractures. Admitted by CCM and transferred to floor/TRH on 11/26/13.  Assessment/Plan:  1. Acute on chronic hypercapnic respiratory failure: Secondary to COPD exacerbation from RSV B infection/? Pneumonitis: s/p VDRF, prolonged mechanical ventilation 8/11-8/20. Completed 5 days course of antibiotics.       - followed by Pulm now signed off      - Changed steroids from IV solumedrol to Prednisone and Brovana.      - Continue Lasix, DuoNeb, Pulmicort, Mucinex and nightly CPAP.       - Has chronic resp failure on 3L Home O2 and baseline Dyspnea with minimal activity.Poor baseline RS status.  CXR 8/25: no acute findings.       -taper prednisone slowly, to  tomorrow       -still reports subjective dyspnea but suspect close to baseline  2. Acute diastolic CHF: Continue monitoring on telemetry. Continue Lasix PO now, was negative 5.8L, but urine output not recorded 8/27 and 8/28  3. New onset A. fib with RVR: Currently in sinus rhythm. Continue Cardizem. High fall risk-no anticoagulation. Started ASA. CHADS2: 1  4. Hypernatremia: Resolved  5. Hypokalemia: Replace as needed.  6. Diarrhea: C. difficile negative. DC rectal tube. GI pathogen panel PCR - negtaive, likely malabsorption, stopped glucerna and supplements due to this       -improving, has soft stools at baseline  7. Macrocytic anemia: Stable.  8. Newly diagnosed type II DM/hypoglycemia: Continue sensitive SSI without HS coverage  9. Acute encephalopathy: Likely secondary to acute hypercapnic respiratory failure. Resolved.  10. Bilateral lower extremity fractures and compound right tibiofibular fracture: Conservative treatment only per Dr. Glee Arvin of  [place] [facility]-due to very high risk for local infection and no benefit and ultimately ability to weight bear. Followup with orthopedics in 2 weeks from 11/14/13. X-rays 8/24 look worse. Discussed with orthopedics on 8/24- no change in Rx/non weight bearing.  11. Leukocytosis:? Secondary to steroids. improving  12. Respiratory alkalosis: Secondary to problem #1  DVT proph: lovenox  Code Status: Full Family Communication: None at bedside Disposition Plan: SNF, Monday   EVENTS/STUDIES  8/11 CT C/A/P > nonspecific infiltrate LUL, Emphysema, hepatic steatosis, porcelain gallbladder, diverticulosis  8/11 CT head/c-spine> NAICP, but extensive opacification of paranasal sinuses, C spint multifocal osteoarthritic change, no fracture  8/11 bilateral ankle films > R tib-fib fracture, left distal fibula fracture and medial malleolus  11/14/13: Normal WUA. Denies tenderness of c spine. OFf levophed. Going to OR possibly for fracture surgery  11/15/13: Failed SBT but was on sedation at this time. Ortho prefer conservative approach due to high risk local infection status.  8/13 Echo 8/12 with mixture of chronic systolic and diastolic dysfn (ef 45% with grade 1 diast dysfn)  8/18- family meeting, full code, trach wishes if needed  8/20 extubated  8/21 fib rvr, cardizem  8/23 transfered from ICU to tele  Consultants:  CCM  Orthopedics  Procedures:  Intubation-extubated 8/20  Rectal tube  Foley catheter  Antibiotics:  Completed levofloxacin 11/17/13   Subjective: Patient continues to complain of dyspnea and chest congestion, but better from yesterday, anxious abt some change in neb Tx  Objective: Filed Vitals:   12/02/13 0100 12/02/13 0507 12/02/13 0747 12/02/13 0924  BP:  118/71  Pulse:  86    Temp:  97.9 F (36.6 C)    TempSrc:  Oral    Resp:  20    Height:      Weight:  114 kg (251 lb 5.2 oz)    SpO2: 98% 99% 98% 99%    Intake/Output Summary (Last 24 hours) at  12/02/13 1201 Last data filed at 12/02/13 1053  Gross per 24 hour  Intake    750 ml  Output    202 ml  Net    548 ml   Filed Weights   11/30/13 0609 12/01/13 0514 12/02/13 0507  Weight: 113.3 kg (249 lb 12.5 oz) 113 kg (249 lb 1.9 oz) 114 kg (251 lb 5.2 oz)     Exam:  General exam: Moderately built and poorly nourished middle-aged female propped up in bed with no respiratory distress, anxious appearing as always Respiratory system: Decreased breath sounds bilaterally with scattered wheezes, Able to speak in full sentences. Cardiovascular system: S1 & S2 heard, RRR. No JVD, murmurs, gallops, clicks. 1+ pitting bilateral leg edema.  Gastrointestinal system: Abdomen is nondistended, soft and nontender. Normal bowel sounds heard. Central nervous system: Alert and oriented. No focal neurological deficits. Extremities: Limited movement in lower extremity secondary to fractures. Splints on bilateral lower extremities up to knee.   Data Reviewed: Basic Metabolic Panel:  Recent Labs Lab 11/27/13 0525 11/28/13 0525 11/28/13 1500 11/29/13 0533 12/01/13 0526  NA 139 135* 141 140 141  K 4.7 5.3 4.6 4.7 3.6*  CL 85* 89* 86* 86* 88*  CO2 >45* >45* >45* >45* >45*  GLUCOSE 214* 203* 148* 155* 119*  BUN 22 21 24* 20 19  CREATININE 0.57 0.57 0.59 0.52 0.56  CALCIUM 9.3 9.6 9.9 9.8 9.6   Liver Function Tests: No results found for this basename: AST, ALT, ALKPHOS, BILITOT, PROT, ALBUMIN,  in the last 168 hours No results found for this basename: LIPASE, AMYLASE,  in the last 168 hours No results found for this basename: AMMONIA,  in the last 168 hours CBC:  Recent Labs Lab 11/27/13 0525 11/28/13 0525 11/29/13 0533 12/01/13 0526 12/02/13 0500  WBC 20.5* 24.3* 17.2* 20.7* 17.9*  HGB 10.4* 11.1* 10.8* 10.8* 10.9*  HCT 34.3* 36.4 37.1 35.9* 36.2  MCV 101.5* 101.7* 105.1* 99.2 101.7*  PLT 402* 431* 389 337 279   Cardiac Enzymes: No results found for this basename: CKTOTAL, CKMB,  CKMBINDEX, TROPONINI,  in the last 168 hours BNP (last 3 results)  Recent Labs  11/16/13 0210 11/18/13 0241 11/19/13 0346  PROBNP 34.5 388.9* 227.7*   CBG:  Recent Labs Lab 12/01/13 1111 12/01/13 1606 12/01/13 2058 12/02/13 0602 12/02/13 1106  GLUCAP 153* 211* 270* 108* 289*    No results found for this or any previous visit (from the past 240 hour(s)).       Studies: No results found.      Scheduled Meds: . antiseptic oral rinse  7 mL Mouth Rinse QID  . arformoterol  15 mcg Nebulization BID  . aspirin EC  81 mg Oral Daily  . budesonide (PULMICORT) nebulizer solution  0.5 mg Nebulization BID  . chlorhexidine  15 mL Mouth Rinse BID  . diltiazem  180 mg Oral Daily  . enoxaparin (LOVENOX) injection  60 mg Subcutaneous Q24H  . famotidine  20 mg Oral BID  . fentaNYL  50 mcg Transdermal Q72H  . furosemide  40 mg Oral Daily  . guaiFENesin  1,200 mg Oral BID  . insulin aspart  0-9 Units Subcutaneous TID WC  . ipratropium-albuterol  3 mL Nebulization Q6H  . pantoprazole  40 mg Oral Q1200  . predniSONE  40 mg Oral Q breakfast  . sodium chloride  10-40 mL Intracatheter Q12H   Continuous Infusions:   Principal Problem:   Acute respiratory failure Active Problems:   Chronic diastolic heart failure   COPD exacerbation   Acute encephalopathy   Tibia/fibula fracture   Respiratory syncytial virus (RSV) infection    Time spent: 25 minutes.    Zannie Cove, MD [facility] Pager [phone]  If 7PM-7AM, please contact night-coverage [email or web address] Password [email or web address] 12/02/2013, 12:01 PM    LOS: 19 days

## 2013-12-02 NOTE — Plan of Care (Signed)
Problem: Phase I Progression Outcomes Goal: Hemodynamically stable Outcome: Progressing Vital signs stable.      

## 2013-12-03 DIAGNOSIS — E119 Type 2 diabetes mellitus without complications: Secondary | ICD-10-CM | POA: Insufficient documentation

## 2013-12-03 LAB — GLUCOSE, CAPILLARY
Glucose-Capillary: 113 mg/dL — ABNORMAL HIGH (ref 70–99)
Glucose-Capillary: 270 mg/dL — ABNORMAL HIGH (ref 70–99)

## 2013-12-03 MED ORDER — IPRATROPIUM-ALBUTEROL 0.5-2.5 (3) MG/3ML IN SOLN: 3.0000 mL | Freq: Four times a day (QID) | RESPIRATORY_TRACT | Status: DC

## 2013-12-03 MED ORDER — FUROSEMIDE 40 MG PO TABS: 40.0000 mg | ORAL_TABLET | Freq: Every day | ORAL | Status: DC

## 2013-12-03 MED ORDER — GUAIFENESIN ER 600 MG PO TB12: 1200.0000 mg | ORAL_TABLET | Freq: Two times a day (BID) | ORAL | Status: DC

## 2013-12-03 MED ORDER — PANTOPRAZOLE SODIUM 40 MG PO TBEC: 40.0000 mg | DELAYED_RELEASE_TABLET | Freq: Every day | ORAL | Status: AC

## 2013-12-03 MED ORDER — DILTIAZEM HCL ER COATED BEADS 180 MG PO CP24: 180.0000 mg | ORAL_CAPSULE | Freq: Every day | ORAL | Status: DC

## 2013-12-03 MED ORDER — ENOXAPARIN SODIUM 60 MG/0.6ML ~~LOC~~ SOLN: 60.0000 mg | SUBCUTANEOUS | Status: DC

## 2013-12-03 MED ORDER — BUDESONIDE 0.5 MG/2ML IN SUSP: 0.5000 mg | Freq: Two times a day (BID) | RESPIRATORY_TRACT | Status: DC

## 2013-12-03 MED ORDER — ARFORMOTEROL TARTRATE 15 MCG/2ML IN NEBU: 15.0000 ug | INHALATION_SOLUTION | Freq: Two times a day (BID) | RESPIRATORY_TRACT | Status: DC

## 2013-12-03 MED ORDER — CALCIUM CARBONATE ANTACID 500 MG PO CHEW: 1.0000 | CHEWABLE_TABLET | Freq: Three times a day (TID) | ORAL | Status: DC | PRN

## 2013-12-03 MED ORDER — FENTANYL 50 MCG/HR TD PT72: 50.0000 ug | MEDICATED_PATCH | TRANSDERMAL | Status: DC

## 2013-12-03 MED ORDER — PREDNISONE 10 MG PO TABS: 30.0000 mg | ORAL_TABLET | Freq: Every day | ORAL | Status: DC

## 2013-12-03 NOTE — Discharge Summary (Signed)
Physician Discharge Summary  [name]:[id number] DOB: 05/30/1950 DOA: 11/13/2013  PCP: No primary provider on file.  Admit date: 11/13/2013 Discharge date: 12/03/2013  Time spent: 45 minutes  Recommendations for Outpatient Follow-up:  1. Dr.CLance Pulm in 1 week 2. Dr.Michael Xu, Orthopedics in 7-10days 3.   CPAP QHS, was on Autotirate CPAP with minimum 5cmH2O and maximum of 20cmH2O with 4L of O2 bled in 4. Being discharged on Lovenox for DVT prophylaxis for 3-4weeks, please discontinue this after that term 5. 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 She had severe hypercapnic respiratory failure requiring mechanical ventilation. Just prior to admission she fell breaking both ankles.    Hospital Course:   1. Acute on chronic hypercapnic respiratory failure: Secondary to COPD exacerbation, and RSV B infection/? Pneumonitis: s/p VDRF, prolonged mechanical ventilation 8/11-8/20. Completed 5 days course of antibiotics. Continues to have moderate dyspnea. FOllwoed closely by Pulm and signed off 2days ago. Changed steroids from IV solumedrol to PO and Brovana added. Continue Lasix, DuoNeb, Pulmicort, Mucinex and nightly CPAP. Has chronic resp  failure on 3L Home O2 and baseline DYspnea with minimal activity.Poor baseline RS status. CXR 8/25: no acute findings.      -continue CPAP at SNF and Close Pulm FU  2. Acute diastolic CHF:       diuresed with IV lasix, then changed to PO, stable on Lasix PO now, negative 5.8L  3. New onset A. fib with RVR: initially in ICU, resolved. Currently in sinus rhythm. Continue Cardizem. High fall risk-no anticoagulation. Started ASA. CHADS2: 1  4. Hypernatremia: Resolved  6. Diarrhea: C. difficile negative. Required a rectal tube. GI pathogen panel PCR - negative, resolved now  7. Macrocytic anemia: Stable.  8. Newly diagnosed type II DM/hypoglycemia: Continue sensitive SSI, hbaic 7  9. Acute encephalopathy: Likely secondary to acute hypercapnic respiratory failure. Resolved.  10. Bilateral lower extremity fractures and compound right tibiofibular fracture: Conservative treatment only per Dr. Glee Arvin of [place] [facility]-due to very high risk for local infection and no benefit and ultimately ability to weight bear. Followup with orthopedics in 2 weeks from 11/14/13. X-rays 8/24 look worse. Discussed with orthopedics on 8/24- no change in Rx/non weight bearing.       -FU with Dr.XU  11. Leukocytosis:? Secondary to steroids, afebrile  12. Respiratory alkalosis: Secondary to problem #1     Discharge Exam: Filed Vitals:   12/03/13 0518  BP: 117/86  Pulse: 97  Temp: 98.3 F (36.8 C)  Resp: 18    General: AAOx3 Cardiovascular: S1S2/RRR Respiratory: clear but poor air movement  Discharge Instructions You were cared for by a hospitalist during your hospital stay. If you have any  questions about your discharge medications or the care you received while you were in the hospital after you are discharged, you can call the unit and asked to speak with the hospitalist on call if the hospitalist that took care of you is not available. Once you are discharged, your primary care physician will  handle any further medical issues. Please note that NO REFILLS for any discharge medications will be authorized once you are discharged, as it is imperative that you return to your primary care physician (or establish a relationship with a primary care physician if you do not have one) for your aftercare needs so that they can reassess your need for medications and monitor your lab values.  Discharge Instructions   Diet - low sodium heart healthy    Complete by:  As directed             Medication List    STOP taking these medications       beclomethasone 80 MCG/ACT inhaler  Commonly known as:  QVAR     fluticasone-salmeterol 115-21 MCG/ACT inhaler  Commonly known as:  ADVAIR HFA     ipratropium 17 MCG/ACT inhaler  Commonly known as:  ATROVENT HFA      TAKE these medications       acetaminophen 500 MG tablet  Commonly known as:  TYLENOL  Take 500-1,000 mg by mouth every 6 (six) hours as needed for mild pain.     albuterol 108 (90 BASE) MCG/ACT inhaler  Commonly known as:  PROVENTIL HFA;VENTOLIN HFA  Inhale 1-2 puffs into the lungs every 6 (six) hours as needed for wheezing or shortness of breath.     arformoterol 15 MCG/2ML Nebu  Commonly known as:  BROVANA  Take 2 mLs (15 mcg total) by nebulization 2 (two) times daily.     aspirin EC 81 MG tablet  Take 81 mg by mouth daily.     budesonide 0.5 MG/2ML nebulizer solution  Commonly known as:  PULMICORT  Take 2 mLs (0.5 mg total) by nebulization 2 (two) times daily.     calcium carbonate 500 MG chewable tablet  Commonly known as:  TUMS - dosed in mg elemental calcium  Chew 1 tablet (200 mg of elemental calcium total) by mouth 3 (three) times daily as needed for indigestion or heartburn.     diltiazem 180 MG 24 hr capsule  Commonly known as:  CARDIZEM CD  Take 1 capsule (180 mg total) by mouth daily.     enoxaparin 60 MG/0.6ML injection  Commonly known as:  LOVENOX  Inject 0.6 mLs (60 mg total) into the skin daily.  For DVT prophylaxis for 3-4weeks     fentaNYL 50 MCG/HR  Commonly known as:  DURAGESIC - dosed mcg/hr  Place 1 patch (50 mcg total) onto the skin every 3 (three) days.     furosemide 40 MG tablet  Commonly known as:  LASIX  Take 1 tablet (40 mg total) by mouth daily.     guaiFENesin 600 MG 12 hr tablet  Commonly known as:  MUCINEX  Take 2 tablets (1,200 mg total) by mouth 2 (two) times daily.     ipratropium-albuterol 0.5-2.5 (3) MG/3ML Soln  Commonly known as:  DUONEB  Take 3 mLs by nebulization every 6 (six) hours.     pantoprazole 40 MG tablet  Commonly known as:  PROTONIX  Take 1 tablet (40 mg total) by mouth daily at 12 noon.     pravastatin 20 MG tablet  Commonly known as:  PRAVACHOL  Take 20 mg by mouth daily.     predniSONE 10 MG tablet  Commonly known as:  DELTASONE  Take 3 tablets (30 mg total) by mouth daily with breakfast. Take  for 3days then  for 3days then  for 3days then STOP       Allergies  Allergen Reactions  . Codeine Nausea And Vomiting       Follow-up Information   Follow up with Cheral Almas, MD. Schedule an appointment as soon as possible for a visit in 10 days.   Specialty:  Orthopedic Surgery   Contact information:   [address] [place] [address] [phone]       Follow up with Barbaraann Share, MD. Schedule an appointment as soon as possible for a visit in 1 week.   Specialty:  Pulmonary Disease   Contact information:   [address] [place] [address] [phone]        The results of significant diagnostics from this hospitalization (including imaging, microbiology, ancillary and laboratory) are listed below for reference.    Significant Diagnostic Studies: Ct Head Wo Contrast  11/13/2013   CLINICAL DATA:  Altered mental status post trauma  EXAM: CT HEAD WITHOUT CONTRAST  CT CERVICAL SPINE WITHOUT CONTRAST  TECHNIQUE: Multidetector CT imaging of the head and cervical spine was performed  following the standard protocol without intravenous contrast. Multiplanar CT image reconstructions of the cervical spine were also generated.  COMPARISON:  Head CT February 05, 2007  FINDINGS: CT HEAD FINDINGS  The ventricles are normal in size and configuration. There is no appreciable mass, hemorrhage, extra-axial fluid collection, or midline shift. The gray-white compartments are normal. Bony calvarium appears intact. Mastoid air cells are clear. There is diffuse opacification of both nares. There is bilateral maxillary and ethmoid sinus disease. Note that patient is intubated.  CT CERVICAL SPINE FINDINGS  There is no appreciable fracture or spondylolisthesis. Prevertebral soft tissues and predental space regions are normal. There is moderately severe disc space narrowing at C5-6 and C6-7. There is moderate narrowing at C3-4, C4-5, and C7-T1. There is facet osteoarthritic change to varying degrees at all levels bilaterally. There is no frank disc extrusion or stenosis. Note that there is a nasogastric tube and endotracheal tube present.  IMPRESSION: CT head: No intracranial mass, hemorrhage, or extra-axial fluid. No acute infarct. Extensive opacification of the nares bilaterally as well as multifocal paranasal sinus disease.  CT cervical spine: Multifocal osteoarthritic change. No fracture or spondylolisthesis.   Electronically Signed   By: Bretta Bang M.D.   On: 11/13/2013 17:53   Ct Chest W Contrast  11/13/2013   CLINICAL DATA:  Fall out of wheelchair. Chest and abdominal injury. Acute respiratory failure. Intubated.  EXAM: CT CHEST, ABDOMEN, AND PELVIS WITH CONTRAST  TECHNIQUE: Multidetector CT imaging of the chest, abdomen and pelvis was performed following the standard protocol during bolus administration of intravenous contrast.  CONTRAST:  OMNIPAQUE IOHEXOL 300 MG/ML  SOLN  COMPARISON:  Chest CT on 06/24/2011  FINDINGS: CT CHEST FINDINGS  Mediastinum/Hilar Regions: No masses or pathologically  enlarged lymph nodes identified.  Lungs: Mild emphysema noted. Bibasilar scarring again demonstrated. New mild heterogeneous airspace disease is seen in the medial left upper lobe, with which is nonspecific and could be due to contusion, aspiration, or pneumonia.  Pleura: No evidence of effusion or mass. No evidence of pneumothorax.  Vascular/Cardiac: No evidence of thoracic aortic injury or mediastinal hematoma.  Musculoskeletal: No suspicious bone lesions identified. No acute fractures identified.  Other: Endotracheal tube and nasogastric tube are seen in appropriate position.  CT ABDOMEN AND PELVIS FINDINGS  Liver: No parenchymal lacerations or masses identified. Diffuse hepatic steatosis again demonstrated.  Gallbladder/Biliary: Mural pattern of calcification is suspicious for porcelain gallbladder. Small gallstones cannot be excluded.  Pancreas: No mass, inflammatory changes, or other parenchymal abnormality identified.  Spleen:  Within normal limits in size and appearance.  Adrenal Glands:  No mass identified.  Kidneys/Urinary Tract: No masses identified. No evidence of hydronephrosis. Small left renal cysts again noted. Foley catheter seen within the bladder.  Lymph Nodes:  No pathologically enlarged lymph nodes identified.  Bowel: Mild sigmoid diverticulosis again demonstrated. No evidence of diverticulitis or other significant abnormality.  Pelvic/Reproductive Organs: No mass or other significant abnormality identified.  Vascular:  No evidence of abdominal aortic aneurysm.  Musculoskeletal:  No suspicious bone lesions identified.  Other:  None.  IMPRESSION: Mild nonspecific pulmonary infiltrate in medial left upper lobe. Differential diagnosis includes pulmonary contusion, aspiration, or pneumonia. No other traumatic injury identified.  Emphysema.  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 Small calcified gallstones cannot be excluded.  Diverticulosis. No radiographic evidence of  diverticulitis.   Electronically Signed   By: Myles Rosenthal M.D.   On: 11/13/2013 18:04   Ct Cervical Spine Wo Contrast  11/13/2013   CLINICAL DATA:  Altered mental status post trauma  EXAM: CT HEAD WITHOUT CONTRAST  CT CERVICAL SPINE WITHOUT CONTRAST  TECHNIQUE: Multidetector CT imaging of the head and cervical spine was performed following the standard protocol without intravenous contrast. Multiplanar CT image reconstructions of the cervical spine were also generated.  COMPARISON:  Head CT February 05, 2007  FINDINGS: CT HEAD FINDINGS  The ventricles are normal in size and configuration. There is no appreciable mass, hemorrhage, extra-axial fluid collection, or midline shift. The gray-white compartments are normal. Bony calvarium appears intact. Mastoid air cells are clear. There is diffuse opacification of both nares. There is bilateral maxillary and ethmoid sinus disease. Note that patient is intubated.  CT CERVICAL SPINE FINDINGS  There is no appreciable fracture or spondylolisthesis. Prevertebral soft tissues and predental space regions are normal. There is moderately severe disc space narrowing at C5-6 and C6-7. There is moderate narrowing at C3-4, C4-5, and C7-T1. There is facet osteoarthritic change to varying degrees at all levels bilaterally. There is no frank disc extrusion or stenosis. Note that there is a nasogastric tube and endotracheal tube present.  IMPRESSION: CT head: No intracranial mass, hemorrhage, or extra-axial fluid. No acute infarct. Extensive opacification of the nares bilaterally as well as multifocal paranasal sinus disease.  CT cervical spine: Multifocal osteoarthritic change. No fracture or spondylolisthesis.   Electronically Signed   By: Bretta Bang M.D.   On: 11/13/2013 17:53   Ct Abdomen Pelvis W Contrast  11/13/2013   CLINICAL DATA:  Fall out of wheelchair. Chest and abdominal injury. Acute respiratory failure. Intubated.  EXAM: CT CHEST, ABDOMEN, AND PELVIS WITH  CONTRAST  TECHNIQUE: Multidetector CT imaging of the chest, abdomen and pelvis was performed following the standard protocol during bolus administration of intravenous contrast.  CONTRAST:  OMNIPAQUE IOHEXOL 300 MG/ML  SOLN  COMPARISON:  Chest CT on 06/24/2011  FINDINGS: CT CHEST FINDINGS  Mediastinum/Hilar Regions: No masses or pathologically enlarged lymph nodes identified.  Lungs: Mild emphysema noted. Bibasilar scarring again demonstrated. New mild heterogeneous airspace disease is seen in the medial left upper lobe, with which  is nonspecific and could be due to contusion, aspiration, or pneumonia.  Pleura: No evidence of effusion or mass. No evidence of pneumothorax.  Vascular/Cardiac: No evidence of thoracic aortic injury or mediastinal hematoma.  Musculoskeletal: No suspicious bone lesions identified. No acute fractures identified.  Other: Endotracheal tube and nasogastric tube are seen in appropriate position.  CT ABDOMEN AND PELVIS FINDINGS  Liver: No parenchymal lacerations or masses identified. Diffuse hepatic steatosis again demonstrated.  Gallbladder/Biliary: Mural pattern of calcification is suspicious for porcelain gallbladder. Small gallstones cannot be excluded.  Pancreas: No mass, inflammatory changes, or other parenchymal abnormality identified.  Spleen:  Within normal limits in size and appearance.  Adrenal Glands:  No mass identified.  Kidneys/Urinary Tract: No masses identified. No evidence of hydronephrosis. Small left renal cysts again noted. Foley catheter seen within the bladder.  Lymph Nodes:  No pathologically enlarged lymph nodes identified.  Bowel: Mild sigmoid diverticulosis again demonstrated. No evidence of diverticulitis or other significant abnormality.  Pelvic/Reproductive Organs: No mass or other significant abnormality identified.  Vascular:  No evidence of abdominal aortic aneurysm.  Musculoskeletal:  No suspicious bone lesions identified.  Other:  None.  IMPRESSION: Mild  nonspecific pulmonary infiltrate in medial left upper lobe. Differential diagnosis includes pulmonary contusion, aspiration, or pneumonia. No other traumatic injury identified.  Emphysema.  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 Small calcified gallstones cannot be excluded.  Diverticulosis. No radiographic evidence of diverticulitis.   Electronically Signed   By: Myles Rosenthal M.D.   On: 11/13/2013 18:04   Dg Chest Port 1 View  11/27/2013   CLINICAL DATA:  Increasing shortness of breath.  EXAM: PORTABLE CHEST - 1 VIEW  COMPARISON:  11/24/2013.  FINDINGS: Cardiac silhouette is normal in size. No mediastinal or hilar masses. Mild, stable left base atelectasis. Lungs are otherwise clear allowing for the semi-erect rotated positioning. No convincing pleural effusion or pneumothorax.  Left PICC tip lies near the caval atrial junction, stable and well positioned.  IMPRESSION: No acute findings.  No change from the prior study.   Electronically Signed   By: Amie Portland M.D.   On: 11/27/2013 10:33   Dg Chest Portable 1 View  11/24/2013   CLINICAL DATA:  63 year old female with shortness of Breath. Initial encounter.  EXAM: PORTABLE CHEST - 1 VIEW  COMPARISON:  11/21/2013 and earlier.  FINDINGS: Portable AP semi upright view at 1837 hrs. Extubated and enteric tube removed. Stable left PICC line. Stable lung volumes. Improved bibasilar ventilation, with mild residual opacity on the left. No areas of worsening ventilation Stable cardiac size and mediastinal contours.  IMPRESSION: 1. Extubated and enteric tube removed. 2. Interval improved basilar ventilation, mild residual passed be on the left.   Electronically Signed   By: Augusto Gamble M.D.   On: 11/24/2013 19:05   Dg Chest Port 1 View  11/21/2013   CLINICAL DATA:  Assess endotracheal tube.  EXAM: PORTABLE CHEST - 1 VIEW  COMPARISON:  Chest x-ray from yesterday.  FINDINGS: Endotracheal tube ends between the clavicular heads and carina, stable  from yesterday. Left upper extremity PICC tip projects at the level of the upper right atrium. The catheter positioning is similar to yesterday when accounting for differences in patient positioning. Orogastric tube at least reaches the diaphragm, beyond which there is nonvisualization.  Streaky lower lung opacities, left more than right. The lateral left costophrenic sulcus is excluded. Given semi-erect state, a pneumothorax should not be missed based on this limitation. No evidence of pleural  effusion, excluding the left base.  IMPRESSION: 1. Tubes and central line remain in acceptable position. 2. Bibasilar opacities which could reflect atelectasis or bronchopneumonia.   Electronically Signed   By: Tiburcio Pea M.D.   On: 11/21/2013 06:46   Dg Chest Port 1 View  11/20/2013   CLINICAL DATA:  Reassess edema  EXAM: PORTABLE CHEST - 1 VIEW  COMPARISON:  Portable chest x-ray of November 19, 2013  FINDINGS: The lungs are well-expanded. There is no alveolar infiltrate. The interstitial markings are mildly increased especially in the left infrahilar region. This is stable. The heart and pulmonary vascularity are within the limits of normal. No significant pleural effusion is demonstrated.  The endotracheal tube tip lies 4.8 cm above the crotch of the carina. The esophagogastric tube tip projects below the inferior margin of the image. A left-sided PICC line tip lies in the distal portion of the SVC.  IMPRESSION: There is no evidence of pneumonia nor signal significant pulmonary edema. Subsegmental left lower lobe atelectasis is suspected. The support tubes and lines are in appropriate position.   Electronically Signed   By: David  [place]   On: 11/20/2013 07:39   Dg Chest Port 1 View  11/19/2013   CLINICAL DATA:  Endotracheal tube  EXAM: PORTABLE CHEST - 1 VIEW  COMPARISON:  Chest x-ray from yesterday  FINDINGS: Endotracheal tube ends between the clavicular heads and carina. There is a left upper extremity PICC,  tip at the upper right atrium. Orogastric tube at least reaches the diaphragm, below which there is non visualization.  Normal heart size and mediastinal contours. Mild interstitial coarsening at the bases, with unchanged streaky left basilar opacity. No visible pneumothorax. No pulmonary edema.  IMPRESSION: 1. Unremarkable positioning of tubes and central line. 2. Persistent opacity at the left base which could represent atelectasis or pneumonia.   Electronically Signed   By: Tiburcio Pea M.D.   On: 11/19/2013 06:47   Dg Chest Port 1 View  11/18/2013   CLINICAL DATA:  Intubated.  EXAM: PORTABLE CHEST - 1 VIEW  COMPARISON:  11/17/2013  FINDINGS: Endotracheal tube remains in place with tip well above the carina. Enteric tube courses towards the left upper abdomen with tip not imaged. Left PICC remains in place with tip projecting over the lower SVC. Cardiomediastinal silhouette is unchanged. There is mild, streaky opacity in the left lung base, stable to slightly increased from prior. Right lung remains grossly clear. No pleural effusion or pneumothorax is identified.  IMPRESSION: Streaky left basilar opacity, which may represent atelectasis versus developing infectious infiltrate.   Electronically Signed   By: Sebastian Ache   On: 11/18/2013 08:36   Dg Chest Port 1 View  11/17/2013   CLINICAL DATA:  PICC line placement  EXAM: PORTABLE CHEST - 1 VIEW  COMPARISON:  11/17/2013 at 5:32 a.m.  FINDINGS: New left PICC has its tip in the lower superior vena cava, well positioned.  Endotracheal tube and nasogastric tube are stable in well positioned.  No acute findings in the lungs. Cardiac silhouette is normal in size. Normal mediastinal and hilar contours.  IMPRESSION: New left PICC is well positioned with its tip in the lower superior vena cava. No other change from the prior study.   Electronically Signed   By: Amie Portland M.D.   On: 11/17/2013 13:20   Dg Chest Port 1 View  11/17/2013   CLINICAL DATA:   Evaluate ET tube placement.  EXAM: PORTABLE CHEST - 1 VIEW  COMPARISON:  11/16/2013.  FINDINGS: The patient is rotated. Cardiac size within normal limits. Endotracheal tube unchanged, 5.9 cm above carina. Unchanged enteric tube.  Mild basilar atelectasis is stable. No focal areas of consolidation or significant pleural effusions.  IMPRESSION: Stable chest.  ETT good position.   Electronically Signed   By: Davonna Belling M.D.   On: 11/17/2013 07:45   Dg Chest Port 1 View  11/16/2013   CLINICAL DATA:  Evaluate endotracheal tube.  EXAM: PORTABLE CHEST - 1 VIEW  COMPARISON:  11/15/2013.  FINDINGS: Support apparatus: Endotracheal tube unchanged. Tip appears about 5.8 cm from the carina. Enteric tube remains present.  Cardiomediastinal Silhouette: Unchanged. Rotation of the RIGHT eccentric weights the size of the cardiopericardial silhouette.  Lungs: Increasing bilateral basilar atelectasis. No airspace disease. No pneumothorax.  Effusions:  None.  Other:  Monitoring leads project over the chest.  IMPRESSION: 1. Stable support apparatus. 2. Increasing bilateral basilar atelectasis.   Electronically Signed   By: Andreas Newport M.D.   On: 11/16/2013 07:28   Dg Chest Port 1 View  11/15/2013   CLINICAL DATA:  Acute respiratory failure.  EXAM: PORTABLE CHEST - 1 VIEW  COMPARISON:  02/2014  FINDINGS: Endotracheal tube is in good position. NG tube tip is below the diaphragm. Heart size and vascularity are normal. No infiltrates. Lungs are hyperinflated consistent with COPD.  IMPRESSION: COPD.  No acute infiltrates.  Endotracheal tube in good position.   Electronically Signed   By: Geanie Cooley M.D.   On: 11/15/2013 07:56   Dg Chest Port 1 View  11/13/2013   CLINICAL DATA:  Hypoxia  EXAM: PORTABLE CHEST - 1 VIEW  COMPARISON:  Chest radiograph and chest CT June 24, 2011  FINDINGS: A portion of the left base is not imaged. Endotracheal tube tip is 4.5 cm above the carina. Nasogastric tube tip and side port are below the  diaphragm. There is no appreciable pneumothorax in the regions which are visualized.  There is elevation of the right hemidiaphragm. No edema or consolidation. Heart size and pulmonary vascularity are normal. No adenopathy. No bone lesions are appreciable in visualized regions.  IMPRESSION: Tube positions as described. No pneumothorax appreciable. Note that a portion of the left lateral base region is not visualized.  No edema or consolidation identified in regions which are visualized.   Electronically Signed   By: Bretta Bang M.D.   On: 11/13/2013 17:26   Dg Tibia/fibula Left Port  11/13/2013   CLINICAL DATA:  Postreduction film.  EXAM: PORTABLE LEFT TIBIA AND FIBULA - 2 VIEW  COMPARISON:  Left ankle radiograph 11/13/2013.  FINDINGS: Four views of the left tibia and fibula demonstrate interval close reduction and splint fixation of previously noted tibial and fibular fractures. Previously noted trimalleolar fracture of the ankle is again noted, however, alignment is significantly improved, with decreased displacement of each of the fractures. The medial malleolar fragment remains approximately 9 mm inferiorly displaced, and the lateral malleolar fragment remains approximately 2 mm posteriorly displace. The posterior malleolar fragment is also approximately 3 mm posteriorly displaced. There is a small amount of gas in the soft tissues anterior to the tibiotalar joint, which could suggest that this is an open fracture. There is also an oblique fracture through the proximal third of the fibular diaphysis with approximately 4 mm of anterior displacement.  IMPRESSION: 1. Interval closed reduction and splint fixation for trimalleolar fracture of the left ankle with improved alignment, as above. Small locule of gas in the soft tissues  anterior to the tibiotalar joint may suggest that this is an open fracture. 2. There is also a minimally displaced oblique fracture through the proximal third of the fibular  diaphysis, as above.   Electronically Signed   By: Trudie Reed M.D.   On: 11/13/2013 21:12   Dg Tibia/fibula Right Port  11/13/2013   CLINICAL DATA:  Postreduction ankle region fracture  EXAM: PORTABLE RIGHT TIBIA AND FIBULA - 2 VIEW  COMPARISON:  Right ankle November 13, 2013  FINDINGS: Frontal and lateral views were obtained with overlying immobilization device. The comminuted fracture of the distal fibula is again noted with major fracture fragments in overall near anatomic alignment. There is a fracture of the medial malleolus with alignment near anatomic in this area. Fracture of the posterior tibia is again noted with fracture fragments in near anatomic alignment. No new or apparent more proximal fractures. There appears to be reduction of the ankle mortise disruption.  IMPRESSION: Major fracture fragments in the ankle region or in near anatomic alignment. Previous ankle mortise disruption appears to have been reduced. No more proximal fractures.   Electronically Signed   By: Bretta Bang M.D.   On: 11/13/2013 21:12   Dg Ankle Left Port  11/26/2013   CLINICAL DATA:  Left ankle fracture.  EXAM: PORTABLE LEFT ANKLE - 2 VIEW  COMPARISON:  November 13, 2013.  FINDINGS: The joint has been casted and immobilized. Moderate lateral dislocation of the talus relative to the distal tibia is noted which is increased compared to prior exam. Moderately displaced oblique fractures of the distal fibula and medial malleolus are also noted which are more displaced compared to prior exam.  IMPRESSION: Increased lateral talar dislocation is noted. Moderately displaced fractures of the distal fibula and medial malleolus are also noted which are more displaced compared to prior exam.   Electronically Signed   By: Roque Lias M.D.   On: 11/26/2013 12:21   Dg Ankle Left Port  11/13/2013   CLINICAL DATA:  Fall.  Ankle injury and pain.  EXAM: PORTABLE LEFT ANKLE - 1 VIEW  COMPARISON:  None.  FINDINGS: Only a single  oblique view was obtained portably before the patient was taken to the catheterization lab. The shows fractures of the distal fibula and medial malleolus of the distal tibia. There is anterior and lateral subluxation of the talus with respect to the ankle mortise.  IMPRESSION: Limited one view exam shows fractures of the distal fibula and medial malleolus, with anterior and lateral subluxation of the talus.   Electronically Signed   By: Myles Rosenthal M.D.   On: 11/13/2013 18:17   Dg Ankle Right Port  11/26/2013   CLINICAL DATA:  Right ankle fracture.  EXAM: PORTABLE RIGHT ANKLE - 2 VIEW  COMPARISON:  November 13, 2013.  FINDINGS: The joint has been casted and immobilized. There remains moderate lateral dislocation of the talus relative to the tibia. Mildly displaced comminuted fracture involving distal fibula is noted and slightly improved compared to prior exam.  IMPRESSION: Continued moderate lateral dislocation of the talus relative to the tibia. Mildly displaced comminuted fracture of distal fibula is noted.   Electronically Signed   By: Roque Lias M.D.   On: 11/26/2013 12:24   Dg Ankle Right Port  11/13/2013   CLINICAL DATA:  Found unresponsive.  Obvious deformity.  EXAM: PORTABLE RIGHT ANKLE - 2 VIEW  COMPARISON:  04/23/2007  FINDINGS: Portable AP and lateral views. Comminuted distal fibular fracture. Comminuted medial and posterior  tibial fractures. Widening of the ankle mortise with relative posterior and lateral subluxation of the talar dome relative to the distal tibia. Base of fifth metatarsal intact. Overlying soft tissue swelling.  IMPRESSION: Comminuted fracture subluxation involving the distal tibia and fibula. Suboptimally evaluated on these portable radiographs. Consider further evaluation with CT.   Electronically Signed   By: Jeronimo Greaves M.D.   On: 11/13/2013 18:32    Microbiology: No results found for this or any previous visit (from the past 240 hour(s)).   Labs: Basic Metabolic  Panel:  Recent Labs Lab 11/27/13 0525 11/28/13 0525 11/28/13 1500 11/29/13 0533 12/01/13 0526  NA 139 135* 141 140 141  K 4.7 5.3 4.6 4.7 3.6*  CL 85* 89* 86* 86* 88*  CO2 >45* >45* >45* >45* >45*  GLUCOSE 214* 203* 148* 155* 119*  BUN 22 21 24* 20 19  CREATININE 0.57 0.57 0.59 0.52 0.56  CALCIUM 9.3 9.6 9.9 9.8 9.6   Liver Function Tests: No results found for this basename: AST, ALT, ALKPHOS, BILITOT, PROT, ALBUMIN,  in the last 168 hours No results found for this basename: LIPASE, AMYLASE,  in the last 168 hours No results found for this basename: AMMONIA,  in the last 168 hours CBC:  Recent Labs Lab 11/27/13 0525 11/28/13 0525 11/29/13 0533 12/01/13 0526 12/02/13 0500  WBC 20.5* 24.3* 17.2* 20.7* 17.9*  HGB 10.4* 11.1* 10.8* 10.8* 10.9*  HCT 34.3* 36.4 37.1 35.9* 36.2  MCV 101.5* 101.7* 105.1* 99.2 101.7*  PLT 402* 431* 389 337 279   Cardiac Enzymes: No results found for this basename: CKTOTAL, CKMB, CKMBINDEX, TROPONINI,  in the last 168 hours BNP: BNP (last 3 results)  Recent Labs  11/16/13 0210 11/18/13 0241 11/19/13 0346  PROBNP 34.5 388.9* 227.7*   CBG:  Recent Labs Lab 12/02/13 1106 12/02/13 1641 12/02/13 2057 12/03/13 0612 12/03/13 1045  GLUCAP 289* 187* 159* 113* 270*       Signed:  Yaziel Brandon  [facility] 12/03/2013, 11:17 AM

## 2013-12-04 ENCOUNTER — Encounter: Payer: Self-pay | Admitting: Internal Medicine

## 2013-12-04 ENCOUNTER — Other Ambulatory Visit: Payer: Self-pay | Admitting: *Deleted

## 2013-12-04 ENCOUNTER — Non-Acute Institutional Stay (SKILLED_NURSING_FACILITY): Payer: Medicaid Other | Admitting: Internal Medicine

## 2013-12-04 DIAGNOSIS — E662 Morbid (severe) obesity with alveolar hypoventilation: Secondary | ICD-10-CM

## 2013-12-04 DIAGNOSIS — J9611 Chronic respiratory failure with hypoxia: Secondary | ICD-10-CM

## 2013-12-04 DIAGNOSIS — J9610 Chronic respiratory failure, unspecified whether with hypoxia or hypercapnia: Secondary | ICD-10-CM

## 2013-12-04 DIAGNOSIS — I482 Chronic atrial fibrillation, unspecified: Secondary | ICD-10-CM

## 2013-12-04 DIAGNOSIS — Z299 Encounter for prophylactic measures, unspecified: Secondary | ICD-10-CM

## 2013-12-04 DIAGNOSIS — I5032 Chronic diastolic (congestive) heart failure: Secondary | ICD-10-CM

## 2013-12-04 DIAGNOSIS — G4733 Obstructive sleep apnea (adult) (pediatric): Secondary | ICD-10-CM

## 2013-12-04 DIAGNOSIS — Z7901 Long term (current) use of anticoagulants: Secondary | ICD-10-CM

## 2013-12-04 DIAGNOSIS — J438 Other emphysema: Secondary | ICD-10-CM

## 2013-12-04 DIAGNOSIS — I4891 Unspecified atrial fibrillation: Secondary | ICD-10-CM

## 2013-12-04 DIAGNOSIS — IMO0002 Reserved for concepts with insufficient information to code with codable children: Secondary | ICD-10-CM

## 2013-12-04 DIAGNOSIS — J441 Chronic obstructive pulmonary disease with (acute) exacerbation: Secondary | ICD-10-CM

## 2013-12-04 DIAGNOSIS — Z79899 Other long term (current) drug therapy: Secondary | ICD-10-CM

## 2013-12-04 DIAGNOSIS — S82209P Unspecified fracture of shaft of unspecified tibia, subsequent encounter for closed fracture with malunion: Secondary | ICD-10-CM

## 2013-12-04 DIAGNOSIS — S82409P Unspecified fracture of shaft of unspecified fibula, subsequent encounter for closed fracture with malunion: Secondary | ICD-10-CM

## 2013-12-04 DIAGNOSIS — E119 Type 2 diabetes mellitus without complications: Secondary | ICD-10-CM

## 2013-12-04 DIAGNOSIS — R0902 Hypoxemia: Secondary | ICD-10-CM

## 2013-12-04 MED ORDER — LINAGLIPTIN 5 MG PO TABS: 5.0000 mg | ORAL_TABLET | Freq: Every day | ORAL | Status: DC

## 2013-12-04 MED ORDER — FENTANYL 50 MCG/HR TD PT72: 50.0000 ug | MEDICATED_PATCH | TRANSDERMAL | Status: DC

## 2013-12-04 NOTE — Progress Notes (Signed)
Patient ID: [name], female   DOB: 02-12-1951, 63 y.o.   MRN: [id number]  Provider:  Gwenith Spitz. Renato Gails, D.O., C.M.D. Location:  [facility]  PCP: No primary provider on file.  Code Status: full code  Allergies  Allergen Reactions  . Codeine Nausea And Vomiting    Chief Complaint  Patient presents with  . New Admit To SNF    newly admitted to snf for short term rehab s/p hospitalization with acute respiratory failure and bilateral ankle fractures    HPI: 63 y.o. female with h/o severe COPD, obesity hypoventilation syndrome on 4L home O2, prior anoxic brain injury in 2012 was newly admitted here for rehab s/p hospitalization 8/11-31 with severe hypercapneic respiratory failure requiring ventilation 8/11-20 due to COPD exacerbation (?RSV B infection, pneumonitis) and bilateral ankle fractures from a fall.  She was apparently taking care of her son at home who has bipolar, but had difficulty getting around her home herself.  She was sitting on the toilet and fell off fracturing both ankles (likely due to her respiratory failure). She was quite confused.    Her hospital stay was quite complex:  She had acute on chronic respiratory failure.  For her  COPD exacerbation, she was on the vent from 8/11-20, received 5 days of abx and solumedrol and brovana was added to her neb regimen.    She is on CPAP at hs for her OHS.  She had new onset afib with RVR.  She was placed on aspirin only as her CHADS2 was only 1.  This also caused acute diastolic chf which required diuresis of 5.8 liters.    She had diarrhea requiring a rectal tube.  Her stool cultures and cdiff PCR were all negative.    She also had hypernatremia which resolved.  She was newly diagnosed with DMII, as well with an hba1c of 7 and treated there with SSI.    Her confusion resolved at the hospital.  When seen, she continued to c/o sob.  She says she can breath better leaning to her right.  She says whatever  changes were made to her nebs/inhalers were not helpful.  We discussed that it will take some time for her to feel these effects and that she is weak and deconditioned from being in bed for so long at the hospital.  She is to f/u with Dr. Shelle Iron in a week.  She does have pain in her ankles, as well, especially when she is moved.  No surgery was done due to her "infection risk".  She is nonweightbearing and is to f/u with Dr. Roda Shutters in 10 days.    She is on DVT prophylaxis for 3-4 wks (9/29)  ROS: Review of Systems  Constitutional: Positive for malaise/fatigue. Negative for fever and chills.  HENT: Negative for congestion.   Eyes: Negative for blurred vision.       Glasses  Respiratory: Positive for cough, shortness of breath and wheezing. Negative for sputum production.   Cardiovascular: Negative for chest pain and leg swelling.  Gastrointestinal: Negative for abdominal pain, diarrhea, constipation, blood in stool and melena.       No further diarrhea today  Genitourinary: Negative for dysuria, urgency and frequency.  Musculoskeletal: Positive for falls. Negative for myalgias.       Pain in legs  Skin: Negative for rash.  Neurological: Positive for weakness. Negative for dizziness and headaches.  Endo/Heme/Allergies:       New diabetes  Psychiatric/Behavioral: Negative for  memory loss. The patient is nervous/anxious.     Past Medical History  Diagnosis Date  . Asthma     with exaerbation and admission in 5/12. no h/ intubation. Is also suspected to have some degree of restrictive pattern and moderate emphysema  (per Dr. Teddy Spike note0 and h/o smoking in past.   . Obesity hypoventilation syndrome     followed with Dr. Stanton Kidney ([facility]) once in 2012.   Marland Kitchen OSA (obstructive sleep apnea)     on CPAP qhs. Sleep study in 2009 confirmed. Not very compliant with CPAP  . Diastolic CHF     Echo 2009 confirmed (poor acoustic window) , EF 55%,, mild RVH  . Physical deconditioning      wheelchair bound, unable to perforrm ADLs at home  . HLD (hyperlipidemia)    Past Surgical History  Procedure Laterality Date  . Cesarean section     Social History:   reports that she has quit smoking. She has never used smokeless tobacco. She reports that she does not drink alcohol or use illicit drugs.  Family History  Problem Relation Age of Onset  . COPD Father     Medications: Patient's Medications  New Prescriptions   LINAGLIPTIN (TRADJENTA) 5 MG TABS TABLET    Take 1 tablet (5 mg total) by mouth daily.  Previous Medications   ACETAMINOPHEN (TYLENOL) 500 MG TABLET    Take 500-1,000 mg by mouth every 6 (six) hours as needed for mild pain.   ALBUTEROL (PROVENTIL HFA;VENTOLIN HFA) 108 (90 BASE) MCG/ACT INHALER    Inhale 1-2 puffs into the lungs every 6 (six) hours as needed for wheezing or shortness of breath.   ARFORMOTEROL (BROVANA) 15 MCG/2ML NEBU    Take 2 mLs (15 mcg total) by nebulization 2 (two) times daily.   ASPIRIN EC 81 MG TABLET    Take 81 mg by mouth daily.   BUDESONIDE (PULMICORT) 0.5 MG/2ML NEBULIZER SOLUTION    Take 2 mLs (0.5 mg total) by nebulization 2 (two) times daily.   CALCIUM CARBONATE (TUMS - DOSED IN MG ELEMENTAL CALCIUM) 500 MG CHEWABLE TABLET    Chew 1 tablet (200 mg of elemental calcium total) by mouth 3 (three) times daily as needed for indigestion or heartburn.   DILTIAZEM (CARDIZEM CD) 180 MG 24 HR CAPSULE    Take 1 capsule (180 mg total) by mouth daily.   FENTANYL (DURAGESIC - DOSED MCG/HR) 50 MCG/HR    Place 1 patch (50 mcg total) onto the skin every 3 (three) days.   FUROSEMIDE (LASIX) 40 MG TABLET    Take 1 tablet (40 mg total) by mouth daily.   GUAIFENESIN (MUCINEX) 600 MG 12 HR TABLET    Take 2 tablets (1,200 mg total) by mouth 2 (two) times daily.   IPRATROPIUM-ALBUTEROL (DUONEB) 0.5-2.5 (3) MG/3ML SOLN    Take 3 mLs by nebulization every 6 (six) hours.   PANTOPRAZOLE (PROTONIX) 40 MG TABLET    Take 1 tablet (40 mg total) by mouth daily at 12  noon.   PRAVASTATIN (PRAVACHOL) 20 MG TABLET    Take 20 mg by mouth daily.   PREDNISONE (DELTASONE) 10 MG TABLET    Take 3 tablets (30 mg total) by mouth daily with breakfast. Take  for 3days then  for 3days then  for 3days then STOP  Modified Medications   Modified Medication Previous Medication   ENOXAPARIN (LOVENOX) 60 MG/0.6ML INJECTION enoxaparin (LOVENOX) 60 MG/0.6ML injection      Inject 60 mg  into the skin daily. For DVT prophylaxis    Inject 0.6 mLs (60 mg total) into the skin daily. For DVT prophylaxis for 3-4weeks  Discontinued Medications   ALBUTEROL (PROVENTIL HFA;VENTOLIN HFA) 108 (90 BASE) MCG/ACT INHALER    Inhale 2 puffs into the lungs every 6 (six) hours as needed for shortness of breath.   IPRATROPIUM (ATROVENT HFA) 17 MCG/ACT INHALER    Inhale 2 puffs into the lungs 4 (four) times daily.     Physical Exam: Filed Vitals:   12/04/13 2043  BP: 94/76  Pulse: 92  Temp: 98.2 F (36.8 C)  Resp: 20  Weight: 245 lb (111.131 kg)  SpO2: 96%  Physical Exam  Constitutional: She is oriented to person, place, and time. No distress.  Obese black female sitting up in bed leaning to the right  HENT:  Head: Normocephalic and atraumatic.  Right Ear: External ear normal.  Left Ear: External ear normal.  Nose: Nose normal.  Mouth/Throat: Oropharynx is clear and moist. No oropharyngeal exudate.  Eyes: Conjunctivae and EOM are normal. Pupils are equal, round, and reactive to light.  glasses  Neck: Neck supple. No JVD present.  Cardiovascular: Normal rate, regular rhythm, normal heart sounds and intact distal pulses.   Pulmonary/Chest: She has wheezes. She has no rales.  Increased effort when asked to sit up during exam so I could auscultate posteriorly; wearing O2 at 4L [place]  Abdominal: Soft. Bowel sounds are normal. She exhibits no distension and no mass. There is no tenderness.  Musculoskeletal:  Bilateral lower legs and ankles are wrapped with ace bandages    Neurological: She is alert and oriented to person, place, and time.  Skin: Skin is warm and dry.  Psychiatric:  anxious    Labs reviewed: Basic Metabolic Panel:  Recent Labs  16/10/96 0350  11/22/13 0430 11/23/13 0500 11/24/13 0319  11/28/13 1500 11/29/13 0533 12/01/13 0526  NA 152*  < > 141 144 144  < > 141 140 141  K 3.7  < > 3.6* 3.5* 4.2  < > 4.6 4.7 3.6*  CL 100  < > 85* 87* 86*  < > 86* 86* 88*  CO2 43*  < > >45* >45* >45*  < > >45* >45* >45*  GLUCOSE 198*  < > 147* 147* 108*  < > 148* 155* 119*  BUN 41*  < > 36* 29* 26*  < > 24* 20 19  CREATININE 0.55  < > 0.61 0.57 0.60  < > 0.59 0.52 0.56  CALCIUM 9.3  < > 9.0 9.5 9.5  < > 9.9 9.8 9.6  MG 2.1  --  1.7 2.3 2.3  --   --   --   --   PHOS 4.3  --  2.8  --  4.1  --   --   --   --   < > = values in this interval not displayed. Liver Function Tests:  Recent Labs  11/13/13 1700  AST 41*  ALT 35  ALKPHOS 115  BILITOT <0.2*  PROT 8.4*  ALBUMIN 3.1*   No results found for this basename: LIPASE, AMYLASE,  in the last 8760 hours No results found for this basename: AMMONIA,  in the last 8760 hours CBC:  Recent Labs  11/20/13 0350 11/22/13 0430 11/23/13 0500  11/29/13 0533 12/01/13 0526 12/02/13 0500  WBC 29.7* 28.1* 22.7*  < > 17.2* 20.7* 17.9*  NEUTROABS 24.3* 20.5* 16.3*  --   --   --   --  HGB 10.2* 9.1* 10.3*  < > 10.8* 10.8* 10.9*  HCT 34.1* 29.8* 35.5*  < > 37.1 35.9* 36.2  MCV 102.1* 101.0* 104.7*  < > 105.1* 99.2 101.7*  PLT 308 336 379  < > 389 337 279  < > = values in this interval not displayed. Cardiac Enzymes:  Recent Labs  11/13/13 1653 11/13/13 2358 11/14/13 0321  TROPONINI <0.30 <0.30 <0.30   CBG:  Recent Labs  12/02/13 2057 12/03/13 0612 12/03/13 1045  GLUCAP 159* 113* 270*    Imaging and Procedures: 11/13/13:   PCXR:  Tube positions as described. No pneumothorax appreciable. Note that  a portion of the left lateral base region is not visualized.  No edema or  consolidation identified in regions which are  visualized. CT head: No intracranial mass, hemorrhage, or extra-axial fluid. No acute infarct. Extensive opacification of the nares bilaterally as well as multifocal paranasal sinus disease.  CT cervical spine: Multifocal osteoarthritic change. No fracture or spondylolisthesis. CT chest/abdomen/pelvis:  Mild nonspecific pulmonary infiltrate in medial left upper lobe. Differential diagnosis includes pulmonary contusion, aspiration, or pneumonia. No other traumatic injury identified. Emphysema. 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 Small calcified gallstones cannot be excluded. Diverticulosis. No radiographic evidence of diverticulitis. Left ankle portable:  Limited one view exam shows fractures of the distal fibula and medial malleolus, with anterior and lateral subluxation of the talus Right ankle portable:  Comminuted fracture subluxation involving the distal tibia and fibula. Suboptimally evaluated on these portable radiographs. Consider further evaluation with CT. Left rib/fib portable:  Postreduction 1. Interval closed reduction and splint fixation for trimalleolar fracture of the left ankle with improved alignment, as above. Small locule of gas in the soft tissues anterior to the tibiotalar joint may suggest that this is an open fracture. 2. There is also a minimally displaced oblique fracture through the proximal third of the fibular diaphysis, as above. Right tib/fib portable:  Postreduction:  Major fracture fragments in the ankle region or in near anatomic alignment. Previous ankle mortise disruption appears to have been  reduced. No more proximal fractures 11/26/13: Left ankle portable:  Increased lateral talar dislocation is noted. Moderately displaced fractures of the distal fibula and medial malleolus are also noted which are more displaced compared to prior exam. Right ankle portable:  Continued moderate lateral  dislocation of the talus relative to the tibia. Mildly displaced comminuted fracture of distal fibula is  noted.  Assessment/Plan 1. Chronic respiratory failure with hypoxia -due to COPD/emphysema exacerbation and possibly RSV infection -required intubation and ventilation -has been on chronic O2 at 4L [place] at home and will need indefinitely  2. EMPHYSEMA -cont inhalers and nebs as per pulmonary, Dr. Shelle Iron -f/u appt scheduled -albuterol, brovana, pulmicort, duonebs and prednisone taper  3. COPD exacerbation -due to RSV pneumonitis possibly per hospital records -seems this is nearly resolved, pt still sob, but seems mostl anxious today  4. SLEEP APNEA, OBSTRUCTIVE -cont CPAP qhs with autotitrate min 5cm water to max 20 plus 4L O2 bled in  5. Chronic diastolic heart failure -due to rapid afib -cont lasix  and f/u bmp for K -concho nas diet -daily weights  6. Tibia/fibula fracture, unspecified laterality, closed, with malunion, subsequent encounter -had reductions but no surgery due to her infection risk (suspect also due to her poor respiratory state) -appears placement of bones had worsened on her xrays before discharge -f/u with Dr. Roda Shutters -here for therapy but will be very limited since nonweightbearing  7. OBESITY, MORBID -  CONCHO NAS diet added due to her new diabetes (likely steroid induced)  8. Obesity hypoventilation syndrome -CPAP as above  9. Chronic atrial fibrillation -had acute onset with RVR -cont aspirin due to CHADS2 of 1  10. DM II (diabetes mellitus, type II), controlled -concho nas diet -check cbgs ac breakfast each morning for 2 wks, then go to weekly -add tradjenta for glucose control -will adjust meds as needed based on cbgs  11.  dvt prophylaxis -cont lovenox for 4 wks (9/29) due to immobility   Functional status:  Currently dependent in all adls except feeding due to combination of deconditioning, dyspnea and ankle fractures  bilaterally  Family/ staff Communication: seen with unit supervisor  Labs/tests ordered:  F/u with Dr. Shelle Iron in 1 wk, Dr. Roda Shutters in 10 days, bmp

## 2013-12-08 DIAGNOSIS — I4891 Unspecified atrial fibrillation: Secondary | ICD-10-CM | POA: Insufficient documentation

## 2013-12-11 ENCOUNTER — Inpatient Hospital Stay: Payer: Medicaid Other | Admitting: Adult Health

## 2013-12-14 ENCOUNTER — Encounter: Payer: Self-pay | Admitting: Adult Health

## 2013-12-14 ENCOUNTER — Ambulatory Visit (INDEPENDENT_AMBULATORY_CARE_PROVIDER_SITE_OTHER): Payer: Medicaid Other | Admitting: Adult Health

## 2013-12-14 VITALS — BP 112/60 | HR 102 | Temp 98.4°F

## 2013-12-14 DIAGNOSIS — I5032 Chronic diastolic (congestive) heart failure: Secondary | ICD-10-CM

## 2013-12-14 DIAGNOSIS — J441 Chronic obstructive pulmonary disease with (acute) exacerbation: Secondary | ICD-10-CM

## 2013-12-14 DIAGNOSIS — G4733 Obstructive sleep apnea (adult) (pediatric): Secondary | ICD-10-CM

## 2013-12-14 NOTE — Assessment & Plan Note (Signed)
Severe exacerbation with decompensated Diastolic CHF s/p ankle fx requiring vent support and prolonged hospitalization now in rehab.  Pt is slowing improving  cont on Brovana/budesonide Neb along with Duoneb Four times a day   Cont on CPAP At bedtime   follow up in 4 weeks. And As needed

## 2013-12-14 NOTE — Progress Notes (Signed)
   Subjective:    Patient ID: [name], female    DOB: [birth year], 63 y.o.   MRN: [id number]  HPI 63 yo with known hx of severe COPD-chronic O2 at 4l/m  and OSA/OHS on CPAP At bedtime  Seen for pulmonary consult on 8/11 for  Severe hypercarbic RF resulting in VDRF .   12/14/2013 Post Hospital follow up  Admitted 8/11-8/31 for acute COPD exacerbation , hypercarbic RF requiring mech vent support. She was consulted by PCCM for resp distress, noted to have severe hypercarbia requiring vent support .  Viral panel +RSV. She was tx with abx, steroids and return to CPAP At bedtime  W/ chronic O2 . Discharged back to SNF  She did have decompensated diastolic CHF improved with diuresis , Stay was complicated by Atrial fib tx w/cardizem. Felt high risk for anticoagulation.  Prior to admit with bilateral ankle fx , seen by ortho with close OP follow up .  Currently on Brovana and Budesonide Twice daily  And duoneb Four times a day   CXR on 8/25 w/ no acute findings.  Discharged to SNF rehab.  Since discharge she is still very weak, has bilateral cast and boots. Doing rehab -what she can do. Feels breathing is doing some better. Gets winded easily.  Has ov with ortho again in 4 weeks.  Prior to admission was wheelchair bound, on O2 . Sons help with shopping .  Quit smoking ~1995.  Discharged on Lovenox x 4 weeks.  No hemoptysis, chest pain , worsening edema, n/v/d.  On CPAP At bedtime     Review of Systems Constitutional:   No  weight loss, night sweats,  Fevers, chills,  +fatigue, or  lassitude.  HEENT:   No headaches,  Difficulty swallowing,  Tooth/dental problems, or  Sore throat,                No sneezing, itching, ear ache,  +nasal congestion, post nasal drip,   CV:  No chest pain,  Orthopnea, PND, swelling in lower extremities, anasarca, dizziness, palpitations, syncope.   GI  No heartburn, indigestion, abdominal pain, nausea, vomiting, diarrhea, change in bowel habits, loss of  appetite, bloody stools.   Resp:    No chest wall deformity  Skin: no rash or lesions.  GU: no dysuria, change in color of urine, no urgency or frequency.  No flank pain, no hematuria   MS:  ++.  back pain.  Psych:  No change in mood or affect. No depression or anxiety.  No memory loss.         Objective:   Physical Exam GEN: A/Ox3; pleasant , NAD, chronically ill appearing in wheelchair   HEENT:  [place]/AT,  EACs-clear, TMs-wnl, NOSE-clear, THROAT-clear, no lesions, no postnasal drip or exudate noted. Poor dentition  NECK:  Supple w/ fair ROM; no JVD; normal carotid impulses w/o bruits; no thyromegaly or nodules palpated; no lymphadenopathy.  RESP  Diminished BS in bases .no accessory muscle use, no dullness to percussion  CARD:  RRR, no m/r/g  , no peripheral edema, pulses intact, no cyanosis or clubbing.  GI:   Soft & nt; nml bowel sounds; no organomegaly or masses detected.  Musco: Warm bil, no deformities or joint swelling noted.  Bilateral LE cast , boot on left.   Neuro: alert, no focal deficits noted.    Skin: Warm, no lesions or rashes, very long/thick toenails  '       Assessment & Plan:

## 2013-12-14 NOTE — Assessment & Plan Note (Signed)
Cont on CPAP At bedtime  

## 2013-12-14 NOTE — Patient Instructions (Signed)
Continue on current regimen  follow up Dr. Shelle Iron in 4 weeks and As needed

## 2013-12-14 NOTE — Assessment & Plan Note (Signed)
Cont on current regimen  

## 2014-01-14 ENCOUNTER — Other Ambulatory Visit (HOSPITAL_COMMUNITY): Payer: Self-pay | Admitting: Orthopedic Surgery

## 2014-01-17 ENCOUNTER — Encounter (HOSPITAL_COMMUNITY): Payer: Self-pay | Admitting: *Deleted

## 2014-01-17 MED ORDER — CEFAZOLIN SODIUM-DEXTROSE 2-3 GM-% IV SOLR
2.0000 g | INTRAVENOUS | Status: AC
  Administered 2014-01-18: 2 g via INTRAVENOUS
  Filled 2014-01-17: qty 50

## 2014-01-17 NOTE — Progress Notes (Signed)
[facility] [address] Spoke with Bancroftharmaine, RN for pt. She verified allergies and medical history. Medications had been verified by pharmacy already. I gave [name] pre-op instructions with time of arrival to SS 11:30 AM, NPO after MN tonight, meds to give pt in AM (Diltiazem, Pulmicort neb, Brovana neb, Duo-Neb, Albuterol inhaler if needed, Ativan if needed, Tylenol if needed. [name] asked about pt's Fentanyl patch, due to be changed tomorrow. I told her not to change it because anesthesia will take it off while she is in surgery.  Pt will arrive by [facility] and I instructed [name] to tell them to bring her to Short Stay. Also called [name], pt's sister to give her surgery start time, she states she won't be able to be here. I've called and left a message on son's phone to return call.

## 2014-01-18 ENCOUNTER — Encounter (HOSPITAL_COMMUNITY): Payer: Self-pay | Admitting: *Deleted

## 2014-01-18 ENCOUNTER — Encounter (HOSPITAL_COMMUNITY)
Admission: RE | Disposition: A | Discharge status: Skilled Nursing Facility | Payer: Self-pay | Source: Ambulatory Visit | Attending: Orthopedic Surgery

## 2014-01-18 ENCOUNTER — Ambulatory Visit (HOSPITAL_COMMUNITY): Payer: Medicaid Other | Admitting: Anesthesiology

## 2014-01-18 ENCOUNTER — Ambulatory Visit (HOSPITAL_COMMUNITY): Payer: Medicaid Other

## 2014-01-18 ENCOUNTER — Observation Stay (HOSPITAL_COMMUNITY)
Admission: RE | Admit: 2014-01-18 | Discharge: 2014-01-19 | Disposition: A | Discharge status: Skilled Nursing Facility | Payer: Medicaid Other | Source: Ambulatory Visit | Attending: Orthopedic Surgery | Admitting: Orthopedic Surgery

## 2014-01-18 ENCOUNTER — Encounter (HOSPITAL_COMMUNITY): Payer: Medicaid Other | Admitting: Anesthesiology

## 2014-01-18 DIAGNOSIS — S82892B Other fracture of left lower leg, initial encounter for open fracture type I or II: Secondary | ICD-10-CM | POA: Diagnosis present

## 2014-01-18 DIAGNOSIS — J45909 Unspecified asthma, uncomplicated: Secondary | ICD-10-CM | POA: Diagnosis not present

## 2014-01-18 DIAGNOSIS — E119 Type 2 diabetes mellitus without complications: Secondary | ICD-10-CM | POA: Diagnosis not present

## 2014-01-18 DIAGNOSIS — G4733 Obstructive sleep apnea (adult) (pediatric): Secondary | ICD-10-CM | POA: Insufficient documentation

## 2014-01-18 DIAGNOSIS — F419 Anxiety disorder, unspecified: Secondary | ICD-10-CM | POA: Diagnosis not present

## 2014-01-18 DIAGNOSIS — I5030 Unspecified diastolic (congestive) heart failure: Secondary | ICD-10-CM | POA: Diagnosis not present

## 2014-01-18 DIAGNOSIS — E669 Obesity, unspecified: Secondary | ICD-10-CM | POA: Diagnosis not present

## 2014-01-18 DIAGNOSIS — M869 Osteomyelitis, unspecified: Secondary | ICD-10-CM | POA: Insufficient documentation

## 2014-01-18 DIAGNOSIS — E785 Hyperlipidemia, unspecified: Secondary | ICD-10-CM | POA: Insufficient documentation

## 2014-01-18 DIAGNOSIS — I4891 Unspecified atrial fibrillation: Secondary | ICD-10-CM | POA: Diagnosis not present

## 2014-01-18 DIAGNOSIS — Z885 Allergy status to narcotic agent status: Secondary | ICD-10-CM | POA: Diagnosis not present

## 2014-01-18 DIAGNOSIS — Y929 Unspecified place or not applicable: Secondary | ICD-10-CM | POA: Insufficient documentation

## 2014-01-18 DIAGNOSIS — J449 Chronic obstructive pulmonary disease, unspecified: Secondary | ICD-10-CM | POA: Insufficient documentation

## 2014-01-18 DIAGNOSIS — X58XXXA Exposure to other specified factors, initial encounter: Secondary | ICD-10-CM | POA: Diagnosis not present

## 2014-01-18 DIAGNOSIS — Z87891 Personal history of nicotine dependence: Secondary | ICD-10-CM | POA: Insufficient documentation

## 2014-01-18 DIAGNOSIS — Z9981 Dependence on supplemental oxygen: Secondary | ICD-10-CM

## 2014-01-18 DIAGNOSIS — Z981 Arthrodesis status: Secondary | ICD-10-CM

## 2014-01-18 HISTORY — PX: ANKLE FUSION: SHX5718

## 2014-01-18 HISTORY — DX: Chronic obstructive pulmonary disease, unspecified: J44.9

## 2014-01-18 HISTORY — DX: Type 2 diabetes mellitus without complications: E11.9

## 2014-01-18 HISTORY — DX: Cardiac arrhythmia, unspecified: I49.9

## 2014-01-18 HISTORY — DX: Anxiety disorder, unspecified: F41.9

## 2014-01-18 HISTORY — DX: Chronic respiratory failure, unspecified whether with hypoxia or hypercapnia: J96.10

## 2014-01-18 LAB — CBC
HCT: 37.8 % (ref 36.0–46.0)
Hemoglobin: 11.2 g/dL — ABNORMAL LOW (ref 12.0–15.0)
MCH: 30.3 pg (ref 26.0–34.0)
MCHC: 29.6 g/dL — ABNORMAL LOW (ref 30.0–36.0)
MCV: 102.2 fL — ABNORMAL HIGH (ref 78.0–100.0)
PLATELETS: 360 10*3/uL (ref 150–400)
RBC: 3.7 MIL/uL — AB (ref 3.87–5.11)
RDW: 15 % (ref 11.5–15.5)
WBC: 12.7 10*3/uL — ABNORMAL HIGH (ref 4.0–10.5)

## 2014-01-18 LAB — COMPREHENSIVE METABOLIC PANEL
ALBUMIN: 2.3 g/dL — AB (ref 3.5–5.2)
ALT: 31 U/L (ref 0–35)
AST: 25 U/L (ref 0–37)
Alkaline Phosphatase: 214 U/L — ABNORMAL HIGH (ref 39–117)
Anion gap: 9 (ref 5–15)
BUN: 7 mg/dL (ref 6–23)
CALCIUM: 9.4 mg/dL (ref 8.4–10.5)
CO2: 37 meq/L — AB (ref 19–32)
Chloride: 92 mEq/L — ABNORMAL LOW (ref 96–112)
Creatinine, Ser: 0.4 mg/dL — ABNORMAL LOW (ref 0.50–1.10)
GFR calc Af Amer: >90 mL/min (ref >90)
Glucose, Bld: 111 mg/dL — ABNORMAL HIGH (ref 70–99)
Potassium: 4.9 mEq/L (ref 3.7–5.3)
SODIUM: 138 meq/L (ref 137–147)
TOTAL PROTEIN: 7.7 g/dL (ref 6.0–8.3)
Total Bilirubin: <0.2 mg/dL — ABNORMAL LOW (ref 0.3–1.2)

## 2014-01-18 LAB — GLUCOSE, CAPILLARY
GLUCOSE-CAPILLARY: 110 mg/dL — AB (ref 70–99)
GLUCOSE-CAPILLARY: 129 mg/dL — AB (ref 70–99)
Glucose-Capillary: 131 mg/dL — ABNORMAL HIGH (ref 70–99)
Glucose-Capillary: 113 mg/dL — ABNORMAL HIGH (ref 70–99)

## 2014-01-18 LAB — APTT: APTT: 36 s (ref 24–37)

## 2014-01-18 LAB — PROTIME-INR
INR: 1.01 (ref 0.00–1.49)
Prothrombin Time: 13.4 seconds (ref 11.6–15.2)

## 2014-01-18 SURGERY — ANKLE FUSION
Anesthesia: Monitor Anesthesia Care | Laterality: Left

## 2014-01-18 MED ORDER — ALBUTEROL SULFATE (2.5 MG/3ML) 0.083% IN NEBU
2.5000 mg | INHALATION_SOLUTION | Freq: Four times a day (QID) | RESPIRATORY_TRACT | Status: DC | PRN
  Administered 2014-01-19: 2.5 mg via RESPIRATORY_TRACT
  Filled 2014-01-18 (×2): qty 3

## 2014-01-18 MED ORDER — PANTOPRAZOLE SODIUM 40 MG PO TBEC
40.0000 mg | DELAYED_RELEASE_TABLET | Freq: Every day | ORAL | Status: DC
  Administered 2014-01-19: 40 mg via ORAL
  Filled 2014-01-18: qty 1

## 2014-01-18 MED ORDER — LORAZEPAM 0.5 MG PO TABS: 0.2500 mg | ORAL_TABLET | Freq: Four times a day (QID) | ORAL | Status: DC | PRN

## 2014-01-18 MED ORDER — FENTANYL CITRATE 0.05 MG/ML IJ SOLN
INTRAMUSCULAR | Status: AC
  Administered 2014-01-18: 50 ug
  Filled 2014-01-18: qty 2

## 2014-01-18 MED ORDER — SODIUM CHLORIDE 0.9 % IV SOLN
INTRAVENOUS | Status: DC
  Administered 2014-01-18: 18:00:00 via INTRAVENOUS

## 2014-01-18 MED ORDER — LACTATED RINGERS IV SOLN
INTRAVENOUS | Status: DC
  Administered 2014-01-18: 12:00:00 via INTRAVENOUS

## 2014-01-18 MED ORDER — 0.9 % SODIUM CHLORIDE (POUR BTL) OPTIME
TOPICAL | Status: DC | PRN
  Administered 2014-01-18: 1000 mL

## 2014-01-18 MED ORDER — OXYCODONE HCL 5 MG/5ML PO SOLN
5.0000 mg | Freq: Once | ORAL | Status: DC | PRN
Start: 2014-01-18 — End: 2014-01-18

## 2014-01-18 MED ORDER — MIDAZOLAM HCL 2 MG/2ML IJ SOLN
INTRAMUSCULAR | Status: AC
  Filled 2014-01-18: qty 2

## 2014-01-18 MED ORDER — ALBUTEROL SULFATE HFA 108 (90 BASE) MCG/ACT IN AERS: 2.0000 | INHALATION_SPRAY | Freq: Four times a day (QID) | RESPIRATORY_TRACT | Status: DC | PRN

## 2014-01-18 MED ORDER — METOCLOPRAMIDE HCL 5 MG/ML IJ SOLN: 5.0000 mg | Freq: Three times a day (TID) | INTRAMUSCULAR | Status: DC | PRN

## 2014-01-18 MED ORDER — DILTIAZEM HCL ER COATED BEADS 180 MG PO CP24
180.0000 mg | ORAL_CAPSULE | Freq: Every day | ORAL | Status: DC
  Filled 2014-01-18: qty 1

## 2014-01-18 MED ORDER — MIDAZOLAM HCL 5 MG/5ML IJ SOLN
INTRAMUSCULAR | Status: DC | PRN
  Administered 2014-01-18: 1 mg via INTRAVENOUS

## 2014-01-18 MED ORDER — METOCLOPRAMIDE HCL 5 MG PO TABS
5.0000 mg | ORAL_TABLET | Freq: Three times a day (TID) | ORAL | Status: DC | PRN
  Filled 2014-01-18: qty 2

## 2014-01-18 MED ORDER — ALBUTEROL SULFATE (2.5 MG/3ML) 0.083% IN NEBU
2.5000 mg | INHALATION_SOLUTION | Freq: Once | RESPIRATORY_TRACT | Status: AC
  Administered 2014-01-18: 2.5 mg via RESPIRATORY_TRACT

## 2014-01-18 MED ORDER — ONDANSETRON HCL 4 MG PO TABS: 4.0000 mg | ORAL_TABLET | Freq: Four times a day (QID) | ORAL | Status: DC | PRN

## 2014-01-18 MED ORDER — DEXTROSE 5 % IV SOLN
500.0000 mg | Freq: Four times a day (QID) | INTRAVENOUS | Status: DC | PRN
  Filled 2014-01-18: qty 5

## 2014-01-18 MED ORDER — HYDROMORPHONE HCL 1 MG/ML IJ SOLN
0.2500 mg | INTRAMUSCULAR | Status: DC | PRN
  Administered 2014-01-18 (×3): 0.5 mg via INTRAVENOUS

## 2014-01-18 MED ORDER — ONDANSETRON HCL 4 MG/2ML IJ SOLN
INTRAMUSCULAR | Status: DC | PRN
  Administered 2014-01-18: 4 mg via INTRAVENOUS

## 2014-01-18 MED ORDER — OXYCODONE HCL 5 MG PO TABS: 5.0000 mg | ORAL_TABLET | Freq: Once | ORAL | Status: DC | PRN

## 2014-01-18 MED ORDER — FUROSEMIDE 40 MG PO TABS
40.0000 mg | ORAL_TABLET | Freq: Every day | ORAL | Status: DC
  Administered 2014-01-18 – 2014-01-19 (×2): 40 mg via ORAL
  Filled 2014-01-18 (×3): qty 1

## 2014-01-18 MED ORDER — LIDOCAINE HCL (CARDIAC) 20 MG/ML IV SOLN
INTRAVENOUS | Status: AC
  Filled 2014-01-18: qty 5

## 2014-01-18 MED ORDER — ROPIVACAINE HCL 5 MG/ML IJ SOLN
INTRAMUSCULAR | Status: DC | PRN
  Administered 2014-01-18: 20 mL via PERINEURAL

## 2014-01-18 MED ORDER — BUDESONIDE 0.5 MG/2ML IN SUSP
0.5000 mg | Freq: Two times a day (BID) | RESPIRATORY_TRACT | Status: DC
  Administered 2014-01-18: 0.5 mg via RESPIRATORY_TRACT
  Filled 2014-01-18 (×4): qty 2

## 2014-01-18 MED ORDER — ONDANSETRON HCL 4 MG/2ML IJ SOLN: 4.0000 mg | Freq: Four times a day (QID) | INTRAMUSCULAR | Status: DC | PRN

## 2014-01-18 MED ORDER — ARFORMOTEROL TARTRATE 15 MCG/2ML IN NEBU
15.0000 ug | INHALATION_SOLUTION | Freq: Two times a day (BID) | RESPIRATORY_TRACT | Status: DC
  Administered 2014-01-18: 15 ug via RESPIRATORY_TRACT
  Filled 2014-01-18 (×3): qty 2

## 2014-01-18 MED ORDER — ARTIFICIAL TEARS OP OINT
TOPICAL_OINTMENT | OPHTHALMIC | Status: AC
  Filled 2014-01-18: qty 3.5

## 2014-01-18 MED ORDER — CEFAZOLIN SODIUM-DEXTROSE 2-3 GM-% IV SOLR
2.0000 g | Freq: Four times a day (QID) | INTRAVENOUS | Status: AC
  Administered 2014-01-18 – 2014-01-19 (×3): 2 g via INTRAVENOUS
  Filled 2014-01-18 (×3): qty 50

## 2014-01-18 MED ORDER — PRAVASTATIN SODIUM 20 MG PO TABS
20.0000 mg | ORAL_TABLET | Freq: Every day | ORAL | Status: DC
  Administered 2014-01-18: 20 mg via ORAL
  Filled 2014-01-18 (×2): qty 1

## 2014-01-18 MED ORDER — METHOCARBAMOL 500 MG PO TABS: 500.0000 mg | ORAL_TABLET | Freq: Four times a day (QID) | ORAL | Status: DC | PRN

## 2014-01-18 MED ORDER — FENTANYL 50 MCG/HR TD PT72
50.0000 ug | MEDICATED_PATCH | TRANSDERMAL | Status: DC
  Administered 2014-01-18: 50 ug via TRANSDERMAL
  Filled 2014-01-18: qty 1

## 2014-01-18 MED ORDER — BUPIVACAINE-EPINEPHRINE (PF) 0.5% -1:200000 IJ SOLN
INTRAMUSCULAR | Status: DC | PRN
  Administered 2014-01-18: 10 mL via PERINEURAL

## 2014-01-18 MED ORDER — ONDANSETRON HCL 4 MG/2ML IJ SOLN
INTRAMUSCULAR | Status: AC
  Filled 2014-01-18: qty 2

## 2014-01-18 MED ORDER — LINAGLIPTIN 5 MG PO TABS
5.0000 mg | ORAL_TABLET | Freq: Every day | ORAL | Status: DC
  Administered 2014-01-18 – 2014-01-19 (×2): 5 mg via ORAL
  Filled 2014-01-18 (×2): qty 1

## 2014-01-18 MED ORDER — HYDROMORPHONE HCL 1 MG/ML IJ SOLN
0.5000 mg | INTRAMUSCULAR | Status: DC | PRN
  Administered 2014-01-18: 1 mg via INTRAVENOUS
  Filled 2014-01-18: qty 1

## 2014-01-18 MED ORDER — OXYCODONE-ACETAMINOPHEN 5-325 MG PO TABS
1.0000 | ORAL_TABLET | ORAL | Status: DC | PRN
  Administered 2014-01-18 – 2014-01-19 (×4): 2 via ORAL
  Filled 2014-01-18 (×4): qty 2

## 2014-01-18 MED ORDER — DOCUSATE SODIUM 100 MG PO CAPS
100.0000 mg | ORAL_CAPSULE | Freq: Two times a day (BID) | ORAL | Status: DC
  Administered 2014-01-18 – 2014-01-19 (×2): 100 mg via ORAL
  Filled 2014-01-18 (×2): qty 1

## 2014-01-18 MED ORDER — ASPIRIN EC 81 MG PO TBEC
81.0000 mg | DELAYED_RELEASE_TABLET | Freq: Every day | ORAL | Status: DC
  Administered 2014-01-19: 81 mg via ORAL
  Filled 2014-01-18: qty 1

## 2014-01-18 MED ORDER — ARTIFICIAL TEARS OP OINT
TOPICAL_OINTMENT | OPHTHALMIC | Status: DC | PRN
  Administered 2014-01-18: 1 via OPHTHALMIC

## 2014-01-18 MED ORDER — HYDROMORPHONE HCL 1 MG/ML IJ SOLN
INTRAMUSCULAR | Status: AC
  Filled 2014-01-18: qty 1

## 2014-01-18 MED ORDER — PROPOFOL 10 MG/ML IV BOLUS
INTRAVENOUS | Status: AC
  Filled 2014-01-18: qty 20

## 2014-01-18 MED ORDER — LIDOCAINE HCL (CARDIAC) 20 MG/ML IV SOLN
INTRAVENOUS | Status: DC | PRN
  Administered 2014-01-18: 80 mg via INTRAVENOUS

## 2014-01-18 MED ORDER — IPRATROPIUM-ALBUTEROL 0.5-2.5 (3) MG/3ML IN SOLN
3.0000 mL | Freq: Four times a day (QID) | RESPIRATORY_TRACT | Status: DC
  Administered 2014-01-19 (×2): 3 mL via RESPIRATORY_TRACT
  Filled 2014-01-18: qty 3

## 2014-01-18 MED ORDER — ALBUTEROL SULFATE (2.5 MG/3ML) 0.083% IN NEBU
INHALATION_SOLUTION | RESPIRATORY_TRACT | Status: AC
  Administered 2014-01-18: 2.5 mg via RESPIRATORY_TRACT
  Filled 2014-01-18: qty 3

## 2014-01-18 MED ORDER — PROPOFOL 10 MG/ML IV BOLUS
INTRAVENOUS | Status: DC | PRN
  Administered 2014-01-18: 150 mg via INTRAVENOUS
  Administered 2014-01-18: 50 mg via INTRAVENOUS

## 2014-01-18 MED ORDER — FENTANYL CITRATE 0.05 MG/ML IJ SOLN
INTRAMUSCULAR | Status: AC
  Filled 2014-01-18: qty 5

## 2014-01-18 SURGICAL SUPPLY — 53 items
BANDAGE ESMARK 6X9 LF (GAUZE/BANDAGES/DRESSINGS) ×1 IMPLANT
BIT DRILL CALIBRATED 4.2 (BIT) ×1 IMPLANT
BIT DRILL CALIBRATED 5.0 MM (BIT) IMPLANT
BIT DRILL CANNULATED 13.0X300M (BIT) ×1 IMPLANT
BLADE SAW SGTL HD 18.5X60.5X1. (BLADE) ×3 IMPLANT
BLADE SURG 10 STRL SS (BLADE) IMPLANT
BNDG CMPR 9X6 STRL LF SNTH (GAUZE/BANDAGES/DRESSINGS) ×1
BNDG COHESIVE 4X5 TAN STRL (GAUZE/BANDAGES/DRESSINGS) ×3 IMPLANT
BNDG COHESIVE 6X5 TAN STRL LF (GAUZE/BANDAGES/DRESSINGS) ×3 IMPLANT
BNDG ESMARK 6X9 LF (GAUZE/BANDAGES/DRESSINGS) ×3
BNDG GAUZE ELAST 4 BULKY (GAUZE/BANDAGES/DRESSINGS) ×4 IMPLANT
CAP END 0MM TI T25 STARDV (Cap) IMPLANT
COVER MAYO STAND STRL (DRAPES) IMPLANT
COVER SURGICAL LIGHT HANDLE (MISCELLANEOUS) ×3 IMPLANT
DRAPE INCISE IOBAN 66X45 STRL (DRAPES) IMPLANT
DRAPE OEC MINIVIEW 54X84 (DRAPES) ×3 IMPLANT
DRAPE U-SHAPE 47X51 STRL (DRAPES) ×3 IMPLANT
DRILL BIT CALIBRATED 4.2 (BIT) ×3
DRILL BIT CALIBRATED 5.0 MM (BIT) ×3
DRILL BIT CANNULATED 13.0X300M (BIT) ×3
DRSG ADAPTIC 3X8 NADH LF (GAUZE/BANDAGES/DRESSINGS) ×3 IMPLANT
DRSG PAD ABDOMINAL 8X10 ST (GAUZE/BANDAGES/DRESSINGS) ×3 IMPLANT
DURAPREP 26ML APPLICATOR (WOUND CARE) ×3 IMPLANT
ELECT REM PT RETURN 9FT ADLT (ELECTROSURGICAL) ×3
ELECTRODE REM PT RTRN 9FT ADLT (ELECTROSURGICAL) ×1 IMPLANT
ENDCAP TI (Cap) ×2 IMPLANT
GAUZE SPONGE 4X4 12PLY STRL (GAUZE/BANDAGES/DRESSINGS) ×3 IMPLANT
GLOVE BIOGEL PI IND STRL 9 (GLOVE) ×1 IMPLANT
GLOVE BIOGEL PI INDICATOR 9 (GLOVE) ×2
GLOVE SURG ORTHO 9.0 STRL STRW (GLOVE) ×3 IMPLANT
GOWN STRL REUS W/ TWL XL LVL3 (GOWN DISPOSABLE) ×3 IMPLANT
GOWN STRL REUS W/TWL XL LVL3 (GOWN DISPOSABLE) ×9
GUIDEWIRE 3.2X290 (WIRE) ×3 IMPLANT
KIT BASIN OR (CUSTOM PROCEDURE TRAY) ×3 IMPLANT
KIT ROOM TURNOVER OR (KITS) ×3 IMPLANT
NAIL HINDFOOT TI 10X150MM (Nail) ×3 IMPLANT
NS IRRIG 1000ML POUR BTL (IV SOLUTION) ×3 IMPLANT
PACK ORTHO EXTREMITY (CUSTOM PROCEDURE TRAY) ×3 IMPLANT
PAD ARMBOARD 7.5X6 YLW CONV (MISCELLANEOUS) ×6 IMPLANT
REAMER ROD DEEP FLUTE 2.5X950 (INSTRUMENTS) ×2 IMPLANT
SCREW LOCK STAR 5X28 (Screw) ×3 IMPLANT
SCREW LOCK STAR 6X54 (Screw) ×3 IMPLANT
SCREW LOCK STAR 6X68 (Screw) ×2 IMPLANT
SPONGE GAUZE 4X4 12PLY STER LF (GAUZE/BANDAGES/DRESSINGS) ×2 IMPLANT
SPONGE LAP 18X18 X RAY DECT (DISPOSABLE) ×3 IMPLANT
SUCTION FRAZIER TIP 10 FR DISP (SUCTIONS) ×3 IMPLANT
SUT ETHILON 2 0 PSLX (SUTURE) ×12 IMPLANT
TOWEL OR 17X24 6PK STRL BLUE (TOWEL DISPOSABLE) ×3 IMPLANT
TOWEL OR 17X26 10 PK STRL BLUE (TOWEL DISPOSABLE) ×3 IMPLANT
TUBE CONNECTING 12'X1/4 (SUCTIONS) ×1
TUBE CONNECTING 12X1/4 (SUCTIONS) ×2 IMPLANT
WATER STERILE IRR 1000ML POUR (IV SOLUTION) ×1 IMPLANT
YANKAUER SUCT BULB TIP NO VENT (SUCTIONS) ×3 IMPLANT

## 2014-01-18 NOTE — Anesthesia Preprocedure Evaluation (Signed)
Anesthesia Evaluation  Patient identified by MRN, date of birth, ID band Patient awake    Reviewed: Allergy & Precautions, H&P , NPO status , Patient's Chart, lab work & pertinent test results  History of Anesthesia Complications Negative for: history of anesthetic complications  Airway Mallampati: II TM Distance: >3 FB Neck ROM: Full    Dental  (+) Loose, Missing,    Pulmonary shortness of breath and at rest, asthma , sleep apnea , COPD COPD inhaler, former smoker,  breath sounds clear to auscultation        Cardiovascular +CHF + dysrhythmias Supra Ventricular Tachycardia Rhythm:Regular     Neuro/Psych PSYCHIATRIC DISORDERS Anxiety negative neurological ROS     GI/Hepatic negative GI ROS, Neg liver ROS,   Endo/Other  diabetesMorbid obesity  Renal/GU negative Renal ROS     Musculoskeletal   Abdominal   Peds  Hematology   Anesthesia Other Findings   Reproductive/Obstetrics                           Anesthesia Physical Anesthesia Plan  ASA: III  Anesthesia Plan: Regional and MAC   Post-op Pain Management:    Induction: Intravenous  Airway Management Planned: Natural Airway  Additional Equipment: None  Intra-op Plan:   Post-operative Plan:   Informed Consent: I have reviewed the patients History and Physical, chart, labs and discussed the procedure including the risks, benefits and alternatives for the proposed anesthesia with the patient or authorized representative who has indicated his/her understanding and acceptance.   Dental advisory given  Plan Discussed with: CRNA and Surgeon  Anesthesia Plan Comments:         Anesthesia Quick Evaluation

## 2014-01-18 NOTE — Op Note (Signed)
01/18/2014  2:25 PM  PATIENT:  [name]    PRE-OPERATIVE DIAGNOSIS:  Open Fracture/Dislocation Left Ankle  POST-OPERATIVE DIAGNOSIS:  Same  PROCEDURE:  Left Tibiocalcaneal Fusion Excision osteomyelitis medial malleolus. Local tissue rearrangement over the medial malleolus to close a wound 8 x 6 cm.   SURGEON:  Nadara MustardUDA,Sharena Dibenedetto V, MD  PHYSICIAN ASSISTANT:None ANESTHESIA:   General  PREOPERATIVE INDICATIONS:  [name] is a  63 y.o. female with a diagnosis of Open Fracture/Dislocation Left Ankle who failed conservative measures and elected for surgical management.    The risks benefits and alternatives were discussed with the patient preoperatively including but not limited to the risks of infection, bleeding, nerve injury, cardiopulmonary complications, the need for revision surgery, among others, and the patient was willing to proceed.  OPERATIVE IMPLANTS: Tibial calcaneal nail 10 x 150 mm  OPERATIVE FINDINGS: Thin atrophic skin with very osteoporotic bone  OPERATIVE PROCEDURE: Patient was brought to the operating room and underwent a general anesthetic. After adequate levels of anesthesia were obtained patient's left lower extremity was prepped using DuraPrep draped into a sterile field. A timeout was called. An elliptical incision was made around the ulcer over the medial malleolus. This created a wound that was 6 x 8 cm. The medial malleolus was excised. The bone was extremely soft. The wound was irrigated. Attention was then focused laterally. A lateral incision was made over the fibula and the distal fibula and tibia were resected perpendicular to long axis of the tibia. The talar dome was transected perpendicular to the long axis of the tibia. The bone edges were reduced the ankle was reduced. A K wire was inserted from the calcaneus into the tibia. This was overreamed to 11 mm for a 10 mm nail. The 10 mm nail was inserted. C-arm fluoroscopy was used 2 locking screws were placed  posteriorly into the nail and calcaneus. A separate screw was placed proximally into the nail. C-arm fluoroscopy verified alignment of both AP and lateral planes. The wounds were irrigated with normal saline. The incisions were closed using 2-0 nylon. Local tissue rearrangement was performed on the medial wound for wound closure. Patient had very long onychomycotic nails the nails were about 3 cm long. These were trimmed as at the patient's request. Sterile compressive dressing was applied. Patient was extubated taken to the PACU in stable condition. Plan for discharge back to skilled nursing.

## 2014-01-18 NOTE — Transfer of Care (Signed)
Immediate Anesthesia Transfer of Care Note  Patient: Brandy StainCora E Loudermilk  Procedure(s) Performed: Procedure(s): Left Tibiocalcaneal Fusion (Left)  Patient Location: PACU  Anesthesia Type:General  Level of Consciousness: lethargic and responds to stimulation  Airway & Oxygen Therapy: Patient Spontanous Breathing and Patient connected to nasal cannula oxygen  Post-op Assessment: Report given to PACU RN  Post vital signs: Reviewed and stable  Complications: No apparent anesthesia complications

## 2014-01-18 NOTE — Discharge Instructions (Signed)
Change dressing as needed. Keep leg elevated level with the heart. Touchdown weightbearing left lower extremity. Keep fracture boot on at all times.

## 2014-01-18 NOTE — Progress Notes (Signed)
Plan for discharge back to skilled nursing on Saturday.

## 2014-01-18 NOTE — Progress Notes (Signed)
Orthopedic Tech Progress Note Patient Details:  Noralee StainCora E Ayo 1950/10/04 [id number]  Ortho Devices Type of Ortho Device: Postop shoe/boot Ortho Device/Splint Location: lle Ortho Device/Splint Interventions: Application   Nikki Domrawford, Whitlee Sluder 01/18/2014, 9:49 PM

## 2014-01-18 NOTE — H&P (Signed)
[name] is an 63 y.o. female.   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  Past Medical History  Diagnosis Date  . Asthma     with exaerbation and admission in 5/12. no h/ intubation. Is also suspected to have some degree of restrictive pattern and moderate emphysema  (per Dr. Teddy Spikelance's note0 and h/o smoking in past.   . Obesity hypoventilation syndrome     followed with Dr. Stanton KidneyKieth Clance ([facility]) once in 2012.   Marland Kitchen. OSA (obstructive sleep apnea)     on CPAP qhs. Sleep study in 2009 confirmed. Not very compliant with CPAP  . Diastolic CHF     Echo 2009 confirmed (poor acoustic window) , EF 55%,, mild RVH  . Physical deconditioning     wheelchair bound, unable to perforrm ADLs at home  . HLD (hyperlipidemia)   . COPD (chronic obstructive pulmonary disease)     uses O2 4L   . Dysrhythmia     atrial fib  . Diabetes mellitus without complication   . Anxiety   . Chronic respiratory failure     Past Surgical History  Procedure Laterality Date  . Cesarean section      Family History  Problem Relation Age of Onset  . COPD Father    Social History:  reports that she quit smoking about 20 years ago. Her smoking use included Cigarettes. She has a 30 pack-year smoking history. She has never used smokeless tobacco. She reports that she does not drink alcohol or use illicit drugs.  Allergies:  Allergies  Allergen Reactions  . Codeine Nausea And Vomiting    No prescriptions prior to admission    No results found for this or any previous visit (from the past 48 hour(s)). No results found.  Review of Systems  All other systems reviewed and are negative.   There were no vitals taken for this visit. Physical Exam  On examination patient has cavovarus Charcot collapse of the left  ankle. Assessment/Plan Assessment: Charcot collapse left ankle.  Plan: Will plan for tibial calcaneal fusion on the left. Risks and benefits of surgery were discussed including infection nonhealing of the wounds need for additional surgery. Patient states she understands and wished to proceed at this time.  DUDA,MARCUS V 01/18/2014, 6:44 AM

## 2014-01-18 NOTE — Anesthesia Procedure Notes (Addendum)
Procedure Name: LMA Insertion Date/Time: 01/18/2014 1:38 PM Performed by: Jefm MilesENNIE, JULIE E Patient Re-evaluated:Patient Re-evaluated prior to inductionOxygen Delivery Method: Circle system utilized Preoxygenation: Pre-oxygenation with 100% oxygen Intubation Type: IV induction LMA: LMA inserted LMA Size: 4.0 Number of attempts: 1 Placement Confirmation: positive ETCO2 and breath sounds checked- equal and bilateral Tube secured with: Tape Dental Injury: Teeth and Oropharynx as per pre-operative assessment    Anesthesia Regional Block:  Popliteal block  Pre-Anesthetic Checklist: ,, timeout performed, Correct Patient, Correct Site, Correct Laterality, Correct Procedure, Correct Position, site marked, Risks and benefits discussed,  Surgical consent,  Pre-op evaluation,  At surgeon's request and post-op pain management  Laterality: Lower and Left  Prep: chloraprep       Needles:  Injection technique: Single-shot  Needle Type: Echogenic Needle          Additional Needles:  Procedures: ultrasound guided (picture in chart) and nerve stimulator Popliteal block  Nerve Stimulator or Paresthesia:  Response: plantarflexion, 0.6 mA,   Additional Responses:   Narrative:  Start time: 01/18/2014 1:01 PM End time: 01/18/2014 1:12 PM Injection made incrementally with aspirations every 5 mL.  Performed by: Personally  Anesthesiologist: Izreal Kock  Additional Notes: H+P and labs reviewed, risks and benefits discussed with patient, procedure tolerated well without complications

## 2014-01-19 DIAGNOSIS — S82892B Other fracture of left lower leg, initial encounter for open fracture type I or II: Secondary | ICD-10-CM | POA: Diagnosis not present

## 2014-01-19 LAB — GLUCOSE, CAPILLARY
GLUCOSE-CAPILLARY: 133 mg/dL — AB (ref 70–99)
Glucose-Capillary: 165 mg/dL — ABNORMAL HIGH (ref 70–99)

## 2014-01-19 MED ORDER — OXYCODONE HCL 5 MG PO TABS: 5.0000 mg | ORAL_TABLET | ORAL | Status: DC | PRN

## 2014-01-19 NOTE — Discharge Summary (Signed)
Physician Discharge Summary      Patient ID: [name] MRN: [id number] DOB/AGE: 11/02/1950 63 y.o.  Admit date: 01/18/2014 Discharge date: 01/19/2014  Admission Diagnoses:  <principal problem not specified>  Discharge Diagnoses:  Active Problems:   S/P ankle fusion   Past Medical History  Diagnosis Date  . Asthma     with exaerbation and admission in 5/12. no h/ intubation. Is also suspected to have some degree of restrictive pattern and moderate emphysema  (per Dr. Teddy Spikelance's note0 and h/o smoking in past.   . Obesity hypoventilation syndrome     followed with Dr. Stanton KidneyKieth Clance ([facility]) once in 2012.   Marland Kitchen. OSA (obstructive sleep apnea)     on CPAP qhs. Sleep study in 2009 confirmed. Not very compliant with CPAP  . Diastolic CHF     Echo 2009 confirmed (poor acoustic window) , EF 55%,, mild RVH  . Physical deconditioning     wheelchair bound, unable to perforrm ADLs at home  . HLD (hyperlipidemia)   . COPD (chronic obstructive pulmonary disease)     uses O2 4L   . Dysrhythmia     atrial fib  . Diabetes mellitus without complication   . Anxiety   . Chronic respiratory failure     Surgeries: Procedure(s): Left Tibiocalcaneal Fusion on 01/18/2014   Consultants (if any):    Discharged Condition: Improved  Hospital Course: [name] is an 63 y.o. female who was admitted 01/18/2014 with a diagnosis of <principal problem not specified> and went to the operating room on 01/18/2014 and underwent the above named procedures.    She was given perioperative antibiotics:      Anti-infectives   Start     Dose/Rate Route Frequency Ordered Stop   01/18/14 1630  ceFAZolin (ANCEF) IVPB 2 g/50 mL premix     2 g 100 mL/hr over 30 Minutes Intravenous Every 6 hours 01/18/14 1610 01/19/14 0431   01/18/14 0600  ceFAZolin (ANCEF) IVPB 2 g/50 mL premix     2 g 100 mL/hr over 30 Minutes Intravenous On call to O.R. 01/17/14 1431 01/18/14 1340    .  She was given sequential  compression devices, early ambulation, and aspirin for DVT prophylaxis.  She benefited maximally from the hospital stay and there were no complications.    Recent vital signs:  Filed Vitals:   01/19/14 0953  BP: 102/53  Pulse: 110  Temp:   Resp:     Recent laboratory studies:  Lab Results  Component Value Date   HGB 11.2* 01/18/2014   HGB 10.9* 12/02/2013   HGB 10.8* 12/01/2013   Lab Results  Component Value Date   WBC 12.7* 01/18/2014   PLT 360 01/18/2014   Lab Results  Component Value Date   INR 1.01 01/18/2014   Lab Results  Component Value Date   NA 138 01/18/2014   K 4.9 01/18/2014   CL 92* 01/18/2014   CO2 37* 01/18/2014   BUN 7 01/18/2014   CREATININE 0.40* 01/18/2014   GLUCOSE 111* 01/18/2014    Discharge Medications:     Medication List         acetaminophen 500 MG tablet  Commonly known as:  TYLENOL  Take 1,000 mg by mouth every 6 (six) hours as needed for mild pain.     albuterol 108 (90 BASE) MCG/ACT inhaler  Commonly known as:  PROVENTIL HFA;VENTOLIN HFA  Inhale 2 puffs into the lungs every 6 (six) hours as needed for wheezing  or shortness of breath.     arformoterol 15 MCG/2ML Nebu  Commonly known as:  BROVANA  Take 2 mLs (15 mcg total) by nebulization 2 (two) times daily.     aspirin EC 81 MG tablet  Take 81 mg by mouth daily.     budesonide 0.5 MG/2ML nebulizer solution  Commonly known as:  PULMICORT  Take 2 mLs (0.5 mg total) by nebulization 2 (two) times daily.     calcium carbonate 500 MG chewable tablet  Commonly known as:  TUMS - dosed in mg elemental calcium  Chew 1 tablet (200 mg of elemental calcium total) by mouth 3 (three) times daily as needed for indigestion or heartburn.     DECUBI-VITE Caps  Take 2 capsules by mouth daily.     diltiazem 180 MG 24 hr capsule  Commonly known as:  CARDIZEM CD  Take 1 capsule (180 mg total) by mouth daily.     fentaNYL 50 MCG/HR  Commonly known as:  DURAGESIC - dosed mcg/hr  Place 1  patch (50 mcg total) onto the skin every 3 (three) days.     furosemide 40 MG tablet  Commonly known as:  LASIX  Take 1 tablet (40 mg total) by mouth daily.     guaiFENesin 600 MG 12 hr tablet  Commonly known as:  MUCINEX  Take 2 tablets (1,200 mg total) by mouth 2 (two) times daily.     ipratropium-albuterol 0.5-2.5 (3) MG/3ML Soln  Commonly known as:  DUONEB  Take 3 mLs by nebulization every 6 (six) hours.     linagliptin 5 MG Tabs tablet  Commonly known as:  TRADJENTA  Take 1 tablet (5 mg total) by mouth daily.     LORazepam 0.5 MG tablet  Commonly known as:  ATIVAN  Take 0.25 mg by mouth every 6 (six) hours as needed for anxiety. 1/2 tab by mouth every 6 hours as needed     oxyCODONE 5 MG immediate release tablet  Commonly known as:  Oxy IR/ROXICODONE  Take 1-3 tablets (5-15 mg total) by mouth every 4 (four) hours as needed.     pantoprazole 40 MG tablet  Commonly known as:  PROTONIX  Take 1 tablet (40 mg total) by mouth daily at 12 noon.     pravastatin 20 MG tablet  Commonly known as:  PRAVACHOL  Take 20 mg by mouth daily at 6 PM.        Diagnostic Studies: Dg Chest 2 View  01/18/2014   CLINICAL DATA:  Preop. Distal left tibia and fibula fractures. History of COPD with shortness of breath and mild cough.  EXAM: CHEST  2 VIEW  COMPARISON:  11/27/2013  FINDINGS: The cardiomediastinal silhouette is within normal limits. There may be a small sliding hiatal hernia. Minimal opacity is again seen in the left lung base, improved from prior. Lungs are otherwise clear. No pleural effusion or pneumothorax is identified. No acute osseous abnormality is identified.  IMPRESSION: Minimal left basilar atelectasis.   Electronically Signed   By: Sebastian AcheAllen  Grady   On: 01/18/2014 12:31    Disposition: 03-Skilled Nursing Facility  Discharge Instructions   Call MD / Call 911    Complete by:  As directed   If you experience chest pain or shortness of breath, CALL 911 and be transported to  the hospital emergency room.  If you develope a fever above 101 F, pus (white drainage) or increased drainage or redness at the wound, or calf pain, call your  surgeon's office.     Call MD / Call 911    Complete by:  As directed   If you experience chest pain or shortness of breath, CALL 911 and be transported to the hospital emergency room.  If you develope a fever above 101.5 F, pus (white drainage) or increased drainage or redness at the wound, or calf pain, call your surgeon's office.     Constipation Prevention    Complete by:  As directed   Drink plenty of fluids.  Prune juice may be helpful.  You may use a stool softener, such as Colace (over the counter) 100 mg twice a day.  Use MiraLax (over the counter) for constipation as needed.     Constipation Prevention    Complete by:  As directed   Drink plenty of fluids.  Prune juice may be helpful.  You may use a stool softener, such as Colace (over the counter) 100 mg twice a day.  Use MiraLax (over the counter) for constipation as needed.     Diet - low sodium heart healthy    Complete by:  As directed      Diet - low sodium heart healthy    Complete by:  As directed      Diet general    Complete by:  As directed      Driving restrictions    Complete by:  As directed   No driving while taking narcotic pain meds.     Increase activity slowly as tolerated    Complete by:  As directed      Increase activity slowly as tolerated    Complete by:  As directed            Follow-up Information   Follow up with DUDA,MARCUS V, MD In 2 weeks.   Specialty:  Orthopedic Surgery   Contact information:   [address] [place] [address] [phone]        Signed: Cheral Almas 01/19/2014, 10:16 AM

## 2014-01-19 NOTE — Plan of Care (Signed)
Problem: Acute Rehab PT Goals(only PT should resolve) Goal: Pt Will Perform Standing Balance Or Pre-Gait Maintaining NWB on LLE

## 2014-01-19 NOTE — Clinical Social Work Note (Signed)
Patient for [facility] today [facility] Son, Thayer OhmChris, and patient agreeable to this plan- plan transfer via EMS. RN to call report/ Reece LevyJanet Rowyn Mustapha, MSW, [employer] [phone]

## 2014-01-19 NOTE — Progress Notes (Signed)
Dressing c/d/i AFVSS Up with PT today Anticipate dc back to SNF today Rx in chart  N. Glee ArvinMichael Xu, MD [facility] [phone] 9:14 AM

## 2014-01-19 NOTE — Anesthesia Postprocedure Evaluation (Signed)
  Anesthesia Post-op Note  Patient: [name]  Procedure(s) Performed: Procedure(s): Left Tibiocalcaneal Fusion (Left)  Patient Location: PACU  Anesthesia Type:General and Regional  Level of Consciousness: awake  Airway and Oxygen Therapy: Patient Spontanous Breathing and Patient connected to nasal cannula oxygen  Post-op Pain: mild  Post-op Assessment: Post-op Vital signs reviewed, Patient's Cardiovascular Status Stable, Respiratory Function Stable, Patent Airway, No signs of Nausea or vomiting and Pain level controlled  Post-op Vital Signs: Reviewed and stable  Last Vitals:  Filed Vitals:   01/19/14 0502  BP: 96/56  Pulse: 81  Temp: 37.1 C  Resp: 18    Complications: No apparent anesthesia complications

## 2014-01-19 NOTE — Evaluation (Addendum)
Physical Therapy Evaluation Patient Details Name: Brandy StainCora E Carducci MRN: [id number] DOB: 10-14-1950 Today's Date: 01/19/2014   History of Present Illness  63 yo female with history if Charcot foot with new fusion NWB  Clinical Impression  Pt demonstrates some significant limits of mobility and cognitive challenge  To increase her functional level.  Her plan is to return to SNF with her stated objective to go home alone at the end of October, 2015.  This seems unlikely but is appropriate for her to be placed there to continue rehab.  Will follow 4x per week as needed.    Follow Up Recommendations SNF    Equipment Recommendations  None recommended by PT    Recommendations for Other Services       Precautions / Restrictions Precautions Precautions: Fall Restrictions Weight Bearing Restrictions: Yes LLE Weight Bearing: Touchdown weight bearing (Better to do NWB functionally)      Mobility  Bed Mobility Overal bed mobility: Needs Assistance Bed Mobility: Rolling;Supine to Sit;Sit to Supine Rolling: Max assist   Supine to sit: Max assist Sit to supine: Max assist   General bed mobility comments: Doesn' t like to roll to L side and reports some dizziness going that way.  Poor ability to problem solve and sequence  Transfers Overall transfer level: Needs assistance Equipment used: 1 person hand held assist (gait belt) Transfers: Sit to/from Stand Sit to Stand: Total assist         General transfer comment: Pt is poor with initation and PT worked with her to sequence  Ambulation/Gait                Stairs            Wheelchair Mobility    Modified Rankin (Stroke Patients Only)       Balance Overall balance assessment: Needs assistance Sitting-balance support: Feet supported;Bilateral upper extremity supported (TDWB LLE) Sitting balance-Leahy Scale: Poor Sitting balance - Comments: Cannot support without sequencing Postural control: Posterior lean                                   Pertinent Vitals/Pain Pain Assessment: Faces Faces Pain Scale: Hurts little more Pain Location: L ankle  Pain Intervention(s): Limited activity within patient's tolerance;Monitored during session;Premedicated before session BP was 96/56, pulse 81 and O2 sat 90 % pre-tx.  O2 in place via nasal cannula.    Home Living Family/patient expects to be discharged to:: Skilled nursing facility                      Prior Function Level of Independence: Independent with assistive device(s)         Comments: Primarily w/c user. Is able to transfer in/out of w/c on her own and able to walk short distance into bathroom with her walker on her own (per pt report).     Hand Dominance   Dominant Hand: Right    Extremity/Trunk Assessment   Upper Extremity Assessment: Generalized weakness           Lower Extremity Assessment: Generalized weakness      Cervical / Trunk Assessment: Kyphotic  Communication   Communication: No difficulties  Cognition Arousal/Alertness: Awake/alert Behavior During Therapy: Flat affect;Impulsive Overall Cognitive Status: History of cognitive impairments - at baseline Area of Impairment: Attention;Following commands;Awareness;Problem solving   Current Attention Level: Divided Memory: Decreased short-term memory Following Commands: Follows one step commands  with increased time;Follows one step commands inconsistently   Awareness: Anticipatory;Intellectual Problem Solving: Requires verbal cues;Requires tactile cues;Decreased initiation;Slow processing;Difficulty sequencing General Comments: Pt is apparently  impaired at baseline and may have worsened as she previously lived alone    General Comments General comments (skin integrity, edema, etc.): Has increased dry skin changes BLE's related to Neuropathy and minimal edema LLE    Exercises General Exercises - Lower Extremity Long Arc Quad:  AROM;Both;10 reps Heel Slides: AROM;Both;10 reps;Seated Hip Flexion/Marching: AROM;Both;10 reps      Assessment/Plan    PT Assessment Patient needs continued PT services  PT Diagnosis Generalized weakness   PT Problem List Decreased strength;Decreased range of motion;Decreased activity tolerance;Decreased balance;Decreased mobility;Decreased coordination;Decreased cognition;Decreased knowledge of use of DME;Impaired sensation;Obesity;Decreased skin integrity;Pain  PT Treatment Interventions DME instruction;Functional mobility training;Therapeutic activities;Therapeutic exercise;Balance training;Neuromuscular re-education;Patient/family education   PT Goals (Current goals can be found in the Care Plan section) Acute Rehab PT Goals Patient Stated Goal: to go home the end of October PT Goal Formulation: With patient Time For Goal Achievement: 02/02/14 Potential to Achieve Goals: Fair    Frequency Min 3X/week   Barriers to discharge Decreased caregiver support Lives alone and previously could do I transfers to wc     Co-evaluation               End of Session Equipment Utilized During Treatment: Gait belt;Oxygen Activity Tolerance: Patient limited by fatigue;Other (comment) (Slow to follow commands) Patient left: in bed;with call bell/phone within reach;with nursing/sitter in room Nurse Communication: Mobility status    Functional Assessment Tool Used: clinical observation Functional Limitation: Changing and maintaining body position Changing and Maintaining Body Position Current Status (Z6109(G8981): At least 60 percent but less than 80 percent impaired, limited or restricted Changing and Maintaining Body Position Goal Status (U0454(G8982): At least 20 percent but less than 40 percent impaired, limited or restricted    Time: 0935-1006 PT Time Calculation (min): 31 min   Charges:   PT Evaluation $Initial PT Evaluation Tier I: 1 Procedure PT Treatments $Therapeutic Activity: 8-22  mins   PT G Codes:   Functional Assessment Tool Used: clinical observation Functional Limitation: Changing and maintaining body position    Ivar DrapeStout, Nieve Rojero E 01/19/2014, 10:37 AM Samul Dadauth Oshay Stranahan, PT MS Acute Rehab Dept. Number: [phone]

## 2014-01-22 ENCOUNTER — Encounter (HOSPITAL_COMMUNITY): Payer: Self-pay | Admitting: Orthopedic Surgery

## 2014-01-29 ENCOUNTER — Other Ambulatory Visit: Payer: Self-pay | Admitting: *Deleted

## 2014-01-29 MED ORDER — FENTANYL 50 MCG/HR TD PT72: 50.0000 ug | MEDICATED_PATCH | TRANSDERMAL | Status: DC

## 2014-01-29 NOTE — Telephone Encounter (Signed)
[facility]  

## 2014-02-20 ENCOUNTER — Other Ambulatory Visit: Payer: Self-pay | Admitting: *Deleted

## 2014-02-20 MED ORDER — FENTANYL 50 MCG/HR TD PT72: 50.0000 ug | MEDICATED_PATCH | TRANSDERMAL | Status: DC

## 2014-02-20 NOTE — Telephone Encounter (Signed)
[facility] 

## 2014-03-05 ENCOUNTER — Other Ambulatory Visit: Payer: Self-pay | Admitting: *Deleted

## 2014-03-05 MED ORDER — OXYCODONE HCL 15 MG PO TABS: ORAL_TABLET | ORAL | Status: DC

## 2014-03-05 NOTE — Telephone Encounter (Signed)
[facility] 

## 2014-03-08 ENCOUNTER — Non-Acute Institutional Stay (SKILLED_NURSING_FACILITY): Payer: Medicare Other | Admitting: Adult Health

## 2014-03-08 DIAGNOSIS — I482 Chronic atrial fibrillation, unspecified: Secondary | ICD-10-CM

## 2014-03-08 DIAGNOSIS — E7800 Pure hypercholesterolemia, unspecified: Secondary | ICD-10-CM

## 2014-03-08 DIAGNOSIS — E119 Type 2 diabetes mellitus without complications: Secondary | ICD-10-CM

## 2014-03-08 DIAGNOSIS — I5032 Chronic diastolic (congestive) heart failure: Secondary | ICD-10-CM

## 2014-03-08 DIAGNOSIS — S82209P Unspecified fracture of shaft of unspecified tibia, subsequent encounter for closed fracture with malunion: Secondary | ICD-10-CM

## 2014-03-08 DIAGNOSIS — S8290XP Unspecified fracture of unspecified lower leg, subsequent encounter for closed fracture with malunion: Secondary | ICD-10-CM

## 2014-03-08 DIAGNOSIS — J441 Chronic obstructive pulmonary disease with (acute) exacerbation: Secondary | ICD-10-CM

## 2014-03-08 DIAGNOSIS — S82409P Unspecified fracture of shaft of unspecified fibula, subsequent encounter for closed fracture with malunion: Secondary | ICD-10-CM

## 2014-03-08 DIAGNOSIS — J42 Unspecified chronic bronchitis: Secondary | ICD-10-CM

## 2014-03-13 NOTE — Progress Notes (Signed)
Patient ID: [name], female   DOB: [birth year], 63 y.o.   MRN: [id number]  [name] living [place]     Allergies  Allergen Reactions  . Codeine Nausea And Vomiting       Chief Complaint  Patient presents with  . Medical Management of Chronic Issues    HPI:  She is a resident of this facility being seen for the management of her chronic illnesses. She is not voicing any complaints at this time. Her left foot does have edema present her cam boot was removed by the orthopedist. We discussed her spending nearly all of her time in the bed. She stated that she would get out of bed daily.    Past Medical History  Diagnosis Date  . Asthma     with exaerbation and admission in 5/12. no h/ intubation. Is also suspected to have some degree of restrictive pattern and moderate emphysema  (per Dr. Teddy Spikelance's note0 and h/o smoking in past.   . Obesity hypoventilation syndrome     followed with Dr. Stanton KidneyKieth Clance ([facility]) once in 2012.   Marland Kitchen. OSA (obstructive sleep apnea)     on CPAP qhs. Sleep study in 2009 confirmed. Not very compliant with CPAP  . Diastolic CHF     Echo 2009 confirmed (poor acoustic window) , EF 55%,, mild RVH  . Physical deconditioning     wheelchair bound, unable to perforrm ADLs at home  . HLD (hyperlipidemia)   . COPD (chronic obstructive pulmonary disease)     uses O2 4L   . Dysrhythmia     atrial fib  . Diabetes mellitus without complication   . Anxiety   . Chronic respiratory failure     Past Surgical History  Procedure Laterality Date  . Cesarean section    . Ankle fusion Left 01/18/2014    Procedure: Left Tibiocalcaneal Fusion;  Surgeon: Nadara MustardMarcus Duda V, MD;  [facility];  Service: Orthopedics;  Laterality: Left;    VITAL SIGNS BP 132/79 mmHg  Pulse 68   Outpatient Encounter Prescriptions as of 03/08/2014  Medication Sig  . acetaminophen (TYLENOL) 500 MG tablet Take 1,000 mg by mouth every 6 (six) hours as needed for mild pain.   Marland Kitchen. albuterol  (PROVENTIL HFA;VENTOLIN HFA) 108 (90 BASE) MCG/ACT inhaler Inhale 2 puffs into the lungs every 6 (six) hours as needed for wheezing or shortness of breath.   Marland Kitchen. arformoterol (BROVANA) 15 MCG/2ML NEBU Take 2 mLs (15 mcg total) by nebulization 2 (two) times daily.  Marland Kitchen. aspirin EC 81 MG tablet Take 81 mg by mouth daily.  . budesonide (PULMICORT) 0.5 MG/2ML nebulizer solution Take 2 mLs (0.5 mg total) by nebulization 2 (two) times daily.  . calcium carbonate (TUMS - DOSED IN MG ELEMENTAL CALCIUM) 500 MG chewable tablet Chew 1 tablet (200 mg of elemental calcium total) by mouth 3 (three) times daily as needed for indigestion or heartburn.  . diltiazem (CARDIZEM CD) 180 MG 24 hr capsule Take 1 capsule (180 mg total) by mouth daily.  . fentaNYL (DURAGESIC - DOSED MCG/HR) 50 MCG/HR Place 1 patch (50 mcg total) onto the skin every 3 (three) days.  . furosemide (LASIX) 40 MG tablet Take 1 tablet (40 mg total) by mouth daily.  Marland Kitchen. guaiFENesin (MUCINEX) 600 MG 12 hr tablet Take 2 tablets (1,200 mg total) by mouth 2 (two) times daily.  Marland Kitchen. ipratropium-albuterol (DUONEB) 0.5-2.5 (3) MG/3ML SOLN Take 3 mLs by nebulization every 6 (six) hours.  Marland Kitchen. linagliptin (TRADJENTA)  5 MG TABS tablet Take 1 tablet (5 mg total) by mouth daily.  Marland Kitchen. LORazepam (ATIVAN) 0.5 MG tablet Take 0.25 mg by mouth every 6 (six) hours as needed for anxiety. 1/2 tab by mouth every 6 hours as needed  . Multiple Vitamins-Minerals (DECUBI-VITE) CAPS Take 2 capsules by mouth daily.  Marland Kitchen. oxyCODONE (ROXICODONE) 15 MG immediate release tablet Take one tablet by mouth every 4 hours as needed for severe pain while awake.  . pantoprazole (PROTONIX) 40 MG tablet Take 1 tablet (40 mg total) by mouth daily at 12 noon.  . pravastatin (PRAVACHOL) 20 MG tablet Take 20 mg by mouth daily at 6 PM.   . [DISCONTINUED] oxyCODONE (OXY IR/ROXICODONE) 5 MG immediate release tablet Take 1-3 tablets (5-15 mg total) by mouth every 4 (four) hours as needed. (Patient not taking:  Reported on 03/13/2014)     SIGNIFICANT DIAGNOSTIC EXAMS   LABS REVIEWED:   12-17-13: wbc 8.4; hgb 10.3; hct  33.8; mcv 101.8; plt 265; glucose 117; bun 12; creat 0.5; k+3.4; na++138    Review of Systems  Constitutional: Negative for malaise/fatigue.  Respiratory: Negative for cough and shortness of breath.   Cardiovascular: Negative for chest pain and palpitations.  Gastrointestinal: Negative for heartburn, abdominal pain and constipation.  Musculoskeletal: Negative for myalgias and joint pain.  Skin: Negative.   Psychiatric/Behavioral: The patient is not nervous/anxious.      Physical Exam  Constitutional: She is oriented to person, place, and time. No distress.  Obese   Neck: Neck supple. No JVD present.  Cardiovascular: Normal rate and regular rhythm.   Respiratory: Effort normal and breath sounds normal. No respiratory distress.  GI: Soft. Bowel sounds are normal. She exhibits no distension. There is no tenderness.  Musculoskeletal: She exhibits edema.  Is able to move extremities Left lower extremity with ace wrap does have edema present Right lower extremity with cam boot in place   Neurological: She is alert and oriented to person, place, and time.  Skin: Skin is warm and dry. She is not diaphoretic.      ASSESSMENT/ PLAN:  1. Chronic bronchitis/COPD: is presently stable will continue brovana neb treatment twice daily; mucinex twice daily will continue budesonide neb treatment twice daily; albuterol 2 puffs every 6 hours as needed; is 02 dependent and duoneb every 6 hours and every 6 hours as needed will monitor   2. Chronic diastolic heart failure: is stable will continue lasix 40 mg daily  and will monitor   3. Gerd: will continue protonix twice daily   4.  Afib: heart rate is stable will continue diltiazem 180 mg daily; and asa 81 mg daily   5. Dyslipidemia: will continue pravachol 20 mg daily   6. Diabetes: will continue tradjenta 5 mg daily and will  monitor  7. Bilateral ankle fractures: will continue therapy as directed and will follow up with orthopedics as indicated. Will continue duragesic 50 mcg patch every 3 days; oxycodone 15 mg every 4 hours as needed for pain and will monitor her status.    Will check bmp   Synthia Innocenteborah Syndey Jaskolski NP [facility]  Contact [phone] Monday through Friday 8am- 5pm  After hours call [phone]

## 2014-03-19 ENCOUNTER — Other Ambulatory Visit: Payer: Self-pay | Admitting: *Deleted

## 2014-03-19 MED ORDER — FENTANYL 50 MCG/HR TD PT72: MEDICATED_PATCH | TRANSDERMAL | Status: DC

## 2014-03-19 NOTE — Telephone Encounter (Signed)
[facility] 

## 2014-03-20 ENCOUNTER — Other Ambulatory Visit: Payer: Self-pay | Admitting: *Deleted

## 2014-03-20 MED ORDER — FENTANYL 50 MCG/HR TD PT72: MEDICATED_PATCH | TRANSDERMAL | Status: DC

## 2014-03-20 NOTE — Telephone Encounter (Signed)
[facility] 

## 2014-03-21 ENCOUNTER — Other Ambulatory Visit: Payer: Self-pay | Admitting: *Deleted

## 2014-03-21 MED ORDER — FENTANYL 50 MCG/HR TD PT72: MEDICATED_PATCH | TRANSDERMAL | Status: DC

## 2014-03-21 NOTE — Telephone Encounter (Signed)
[facility] 

## 2014-03-22 ENCOUNTER — Other Ambulatory Visit: Payer: Self-pay | Admitting: *Deleted

## 2014-03-22 MED ORDER — FENTANYL 50 MCG/HR TD PT72
MEDICATED_PATCH | TRANSDERMAL | Status: DC
Start: 2014-03-22 — End: 2014-03-22

## 2014-03-22 MED ORDER — FENTANYL 50 MCG/HR TD PT72: MEDICATED_PATCH | TRANSDERMAL | Status: DC

## 2014-03-22 NOTE — Telephone Encounter (Signed)
[facility] 

## 2014-04-01 ENCOUNTER — Other Ambulatory Visit: Payer: Self-pay | Admitting: *Deleted

## 2014-04-01 MED ORDER — FENTANYL 50 MCG/HR TD PT72: MEDICATED_PATCH | TRANSDERMAL | Status: DC

## 2014-04-02 ENCOUNTER — Other Ambulatory Visit: Payer: Self-pay | Admitting: *Deleted

## 2014-04-02 MED ORDER — FENTANYL 50 MCG/HR TD PT72: MEDICATED_PATCH | TRANSDERMAL | Status: DC

## 2014-04-02 NOTE — Telephone Encounter (Signed)
[facility] 

## 2014-04-18 ENCOUNTER — Non-Acute Institutional Stay (SKILLED_NURSING_FACILITY): Payer: Medicare Other | Admitting: Adult Health

## 2014-04-18 DIAGNOSIS — S82409S Unspecified fracture of shaft of unspecified fibula, sequela: Secondary | ICD-10-CM

## 2014-04-18 DIAGNOSIS — J438 Other emphysema: Secondary | ICD-10-CM

## 2014-04-18 DIAGNOSIS — S82209S Unspecified fracture of shaft of unspecified tibia, sequela: Secondary | ICD-10-CM

## 2014-04-18 DIAGNOSIS — S8290XS Unspecified fracture of unspecified lower leg, sequela: Secondary | ICD-10-CM

## 2014-04-18 DIAGNOSIS — E876 Hypokalemia: Secondary | ICD-10-CM

## 2014-04-18 DIAGNOSIS — J42 Unspecified chronic bronchitis: Secondary | ICD-10-CM

## 2014-04-18 DIAGNOSIS — K219 Gastro-esophageal reflux disease without esophagitis: Secondary | ICD-10-CM

## 2014-04-18 DIAGNOSIS — I5032 Chronic diastolic (congestive) heart failure: Secondary | ICD-10-CM

## 2014-04-18 DIAGNOSIS — E119 Type 2 diabetes mellitus without complications: Secondary | ICD-10-CM

## 2014-04-19 ENCOUNTER — Other Ambulatory Visit: Payer: Self-pay | Admitting: Internal Medicine

## 2014-04-19 LAB — LIPID PANEL
Cholesterol: 156 mg/dL (ref 0–200)
HDL: 60 mg/dL (ref >39)
LDL Cholesterol: 76 mg/dL (ref 0–99)
Total CHOL/HDL Ratio: 2.6 Ratio
Triglycerides: 102 mg/dL (ref <150)
VLDL: 20 mg/dL (ref 0–40)

## 2014-04-19 LAB — HEMOGLOBIN A1C
Hgb A1c MFr Bld: 5.6 % (ref <5.7)
Mean Plasma Glucose: 114 mg/dL (ref <117)

## 2014-05-01 ENCOUNTER — Other Ambulatory Visit: Payer: Self-pay | Admitting: *Deleted

## 2014-05-01 MED ORDER — OXYCODONE HCL 15 MG PO TABS: ORAL_TABLET | ORAL | Status: DC

## 2014-05-01 NOTE — Telephone Encounter (Signed)
[facility] 

## 2014-05-14 DIAGNOSIS — K219 Gastro-esophageal reflux disease without esophagitis: Secondary | ICD-10-CM | POA: Insufficient documentation

## 2014-05-14 NOTE — Progress Notes (Signed)
Patient ID: [name], female   DOB: 1951/02/03, 64 y.o.   MRN: [id number]  [name] living [place]     Allergies  Allergen Reactions  . Codeine Nausea And Vomiting       Chief Complaint  Patient presents with  . Medical Management of Chronic Issues    HPI:  She is a resident of this facility being seen for the management of her chronic illnesses being seen for the management of her chronic illnesses. Overall her status remains stable.he continues to be encouraged to get out of bed daily with varying success. She tells me that overall she is doing better. There are no nursing concerns at this time.    Past Medical History  Diagnosis Date  . Asthma     with exaerbation and admission in 5/12. no h/ intubation. Is also suspected to have some degree of restrictive pattern and moderate emphysema  (per Dr. Teddy Spike note0 and h/o smoking in past.   . Obesity hypoventilation syndrome     followed with Dr. Stanton Kidney ([facility]) once in 2012.   Marland Kitchen OSA (obstructive sleep apnea)     on CPAP qhs. Sleep study in 2009 confirmed. Not very compliant with CPAP  . Diastolic CHF     Echo 2009 confirmed (poor acoustic window) , EF 55%,, mild RVH  . Physical deconditioning     wheelchair bound, unable to perforrm ADLs at home  . HLD (hyperlipidemia)   . COPD (chronic obstructive pulmonary disease)     uses O2 4L   . Dysrhythmia     atrial fib  . Diabetes mellitus without complication   . Anxiety   . Chronic respiratory failure     Past Surgical History  Procedure Laterality Date  . Cesarean section    . Ankle fusion Left 01/18/2014    Procedure: Left Tibiocalcaneal Fusion;  Surgeon: Nadara Mustard, MD;  Location: [facility];  Service: Orthopedics;  Laterality: Left;    VITAL SIGNS BP 129/79 mmHg  Pulse 75   Outpatient Encounter Prescriptions as of 04/18/2014  Medication Sig  . acetaminophen (TYLENOL) 500 MG tablet Take 1,000 mg by mouth every 6 (six) hours as needed for mild pain.    Marland Kitchen albuterol (PROVENTIL HFA;VENTOLIN HFA) 108 (90 BASE) MCG/ACT inhaler Inhale 2 puffs into the lungs every 6 (six) hours as needed for wheezing or shortness of breath.   Marland Kitchen arformoterol (BROVANA) 15 MCG/2ML NEBU Take 2 mLs (15 mcg total) by nebulization 2 (two) times daily.  Marland Kitchen aspirin EC 81 MG tablet Take 81 mg by mouth daily.  . budesonide (PULMICORT) 0.5 MG/2ML nebulizer solution Take 2 mLs (0.5 mg total) by nebulization 2 (two) times daily.  . calcium carbonate (TUMS - DOSED IN MG ELEMENTAL CALCIUM) 500 MG chewable tablet Chew 1 tablet (200 mg of elemental calcium total) by mouth 3 (three) times daily as needed for indigestion or heartburn.  . diltiazem (CARDIZEM CD) 180 MG 24 hr capsule Take 1 capsule (180 mg total) by mouth daily.  . fentaNYL (DURAGESIC - DOSED MCG/HR) 50 MCG/HR Apply one patch topically every 72 hours for pain. Remove old patch  . furosemide (LASIX) 40 MG tablet Take 1 tablet (40 mg total) by mouth daily.  Marland Kitchen guaiFENesin (MUCINEX) 600 MG 12 hr tablet Take 2 tablets (1,200 mg total) by mouth 2 (two) times daily.  Marland Kitchen ipratropium-albuterol (DUONEB) 0.5-2.5 (3) MG/3ML SOLN Take 3 mLs by nebulization every 6 (six) hours.  Marland Kitchen linagliptin (TRADJENTA) 5 MG TABS  tablet Take 1 tablet (5 mg total) by mouth daily.  Marland Kitchen. LORazepam (ATIVAN) 0.5 MG tablet Take 0.25 mg by mouth every 6 (six) hours as needed for anxiety. 1/2 tab by mouth every 6 hours as needed  . Multiple Vitamins-Minerals (DECUBI-VITE) CAPS Take 2 capsules by mouth daily.  Marland Kitchen. oxyCODONE (ROXICODONE) 15 MG immediate release tablet Take one tablet by mouth every 4 hours as needed for pain  . pantoprazole (PROTONIX) 40 MG tablet Take 1 tablet (40 mg total) by mouth daily at 12 noon.  . pravastatin (PRAVACHOL) 20 MG tablet Take 20 mg by mouth daily at 6 PM.      SIGNIFICANT DIAGNOSTIC EXAMS   LABS REVIEWED:   12-17-13: wbc 8.4; hgb 10.3; hct  33.8; mcv 101.8; plt 265; glucose 117; bun 12; creat 0.5; k+3.4; na++138 03-13-14:  glucose 86; bun 8; creat 0.41; k+3.0; na++141 03-20-14: k+ 3.8    ROS Constitutional: Negative for malaise/fatigue.  Respiratory: Negative for cough and shortness of breath.   Cardiovascular: Negative for chest pain and palpitations.  Gastrointestinal: Negative for heartburn, abdominal pain and constipation.  Musculoskeletal: Negative for myalgias and joint pain.  Skin: Negative.   Psychiatric/Behavioral: The patient is not nervous/anxious.      Physical Exam Constitutional: She is oriented to person, place, and time. No distress.  Obese   Neck: Neck supple. No JVD present.  Cardiovascular: Normal rate and regular rhythm.   Respiratory: Effort normal and breath sounds normal. No respiratory distress.  GI: Soft. Bowel sounds are normal. She exhibits no distension. There is no tenderness.  Musculoskeletal: She exhibits edema.  Is able to move extremities Left lower extremity without  ace wrap does have trace edema present Right lower extremity without splint in place    Neurological: She is alert and oriented to person, place, and time.  Skin: Skin is warm and dry. She is not diaphoretic.    ASSESSMENT/ PLAN:  1. Chronic bronchitis/COPD: is presently stable will continue brovana neb treatment twice daily; mucinex twice daily will continue budesonide neb treatment twice daily; albuterol 2 puffs every 6 hours as needed; is 02 dependent and duoneb every 6 hours and every 6 hours as needed will monitor   2. Chronic diastolic heart failure: is stable will continue lasix 40 mg daily  and will monitor   3. Gerd: will continue protonix twice daily   4.  Afib: heart rate is stable will continue diltiazem 180 mg daily; and asa 81 mg daily   5. Dyslipidemia: will continue pravachol 20 mg daily   6. Diabetes: will continue tradjenta 5 mg daily and will monitor  7. Bilateral ankle fractures: will continue therapy as directed and will follow up with orthopedics as indicated. Will  continue duragesic 50 mcg patch every 3 days; oxycodone 15 mg every 4 hours as needed for pain and will monitor her status.   8. Hypokalemia: will continue k+ 20 meq daily     Synthia Innocenteborah Bergen Melle NP [facility]  Contact [phone] Monday through Friday 8am- 5pm  After hours call [phone]

## 2014-05-27 ENCOUNTER — Non-Acute Institutional Stay (SKILLED_NURSING_FACILITY): Payer: Medicare Other | Admitting: Adult Health

## 2014-05-27 DIAGNOSIS — S8290XS Unspecified fracture of unspecified lower leg, sequela: Secondary | ICD-10-CM | POA: Diagnosis not present

## 2014-05-27 DIAGNOSIS — J441 Chronic obstructive pulmonary disease with (acute) exacerbation: Secondary | ICD-10-CM | POA: Diagnosis not present

## 2014-05-27 DIAGNOSIS — S82209S Unspecified fracture of shaft of unspecified tibia, sequela: Secondary | ICD-10-CM

## 2014-05-27 DIAGNOSIS — S82409S Unspecified fracture of shaft of unspecified fibula, sequela: Secondary | ICD-10-CM

## 2014-05-27 DIAGNOSIS — I5032 Chronic diastolic (congestive) heart failure: Secondary | ICD-10-CM | POA: Diagnosis not present

## 2014-05-27 DIAGNOSIS — E119 Type 2 diabetes mellitus without complications: Secondary | ICD-10-CM | POA: Diagnosis not present

## 2014-06-04 DIAGNOSIS — I5032 Chronic diastolic (congestive) heart failure: Secondary | ICD-10-CM

## 2014-06-04 DIAGNOSIS — I4891 Unspecified atrial fibrillation: Secondary | ICD-10-CM | POA: Diagnosis not present

## 2014-06-04 DIAGNOSIS — E119 Type 2 diabetes mellitus without complications: Secondary | ICD-10-CM

## 2014-06-04 DIAGNOSIS — J441 Chronic obstructive pulmonary disease with (acute) exacerbation: Secondary | ICD-10-CM | POA: Diagnosis not present

## 2014-06-11 ENCOUNTER — Ambulatory Visit: Payer: Medicaid Other | Admitting: Adult Health

## 2014-06-11 ENCOUNTER — Telehealth: Payer: Self-pay | Admitting: *Deleted

## 2014-06-11 NOTE — Telephone Encounter (Signed)
Patient has been discharged from facility. Informed nurse to call patient's primary provider for any orders needed. Agreed.

## 2014-06-13 NOTE — Progress Notes (Signed)
Patient ID: Brandy StainCora E Levett, female   DOB: 1950/07/11, 64 y.o.   MRN: [id number]  [facility] [place]     Allergies  Allergen Reactions  . Codeine Nausea And Vomiting       Chief Complaint  Patient presents with  . Discharge Note    HPI:  She is being discharged to home with home health for pt/ot/rn for gait training; balance; adll retraining and medication management. She will need 02 as her sats drop below 88% on room air. She will need a hospital bed due to her chf and copd. She will need her prescriptions written and a follow up with her pcp.    Past Medical History  Diagnosis Date  . Asthma     with exaerbation and admission in 5/12. no h/ intubation. Is also suspected to have some degree of restrictive pattern and moderate emphysema  (per Dr. Teddy Spikelance's note0 and h/o smoking in past.   . Obesity hypoventilation syndrome     followed with Dr. Stanton KidneyKieth Clance ([facility]) once in 2012.   Marland Kitchen. OSA (obstructive sleep apnea)     on CPAP qhs. Sleep study in 2009 confirmed. Not very compliant with CPAP  . Diastolic CHF     Echo 2009 confirmed (poor acoustic window) , EF 55%,, mild RVH  . Physical deconditioning     wheelchair bound, unable to perforrm ADLs at home  . HLD (hyperlipidemia)   . COPD (chronic obstructive pulmonary disease)     uses O2 4L   . Dysrhythmia     atrial fib  . Diabetes mellitus without complication   . Anxiety   . Chronic respiratory failure     Past Surgical History  Procedure Laterality Date  . Cesarean section    . Ankle fusion Left 01/18/2014    Procedure: Left Tibiocalcaneal Fusion;  Surgeon: Nadara MustardMarcus Duda V, MD;  [facility];  Service: Orthopedics;  Laterality: Left;    VITAL SIGNS BP 117/63 mmHg  Pulse 87  Ht 5\' 4"  (1.626 m)  Wt 230 lb (104.327 kg)  BMI 39.46 kg/m2  SpO2 97%   Outpatient Encounter Prescriptions as of 05/27/2014  Medication Sig  . acetaminophen (TYLENOL) 500 MG tablet Take 1,000 mg by mouth every 6 (six) hours as  needed for mild pain.   Marland Kitchen. albuterol (PROVENTIL HFA;VENTOLIN HFA) 108 (90 BASE) MCG/ACT inhaler Inhale 2 puffs into the lungs every 6 (six) hours as needed for wheezing or shortness of breath.   Marland Kitchen. arformoterol (BROVANA) 15 MCG/2ML NEBU Take 2 mLs (15 mcg total) by nebulization 2 (two) times daily.  Marland Kitchen. aspirin EC 81 MG tablet Take 81 mg by mouth daily.  . budesonide (PULMICORT) 0.5 MG/2ML nebulizer solution Take 2 mLs (0.5 mg total) by nebulization 2 (two) times daily.  . calcium carbonate (TUMS - DOSED IN MG ELEMENTAL CALCIUM) 500 MG chewable tablet Chew 1 tablet (200 mg of elemental calcium total) by mouth 3 (three) times daily as needed for indigestion or heartburn.  . diltiazem (CARDIZEM CD) 180 MG 24 hr capsule Take 1 capsule (180 mg total) by mouth daily.  . fentaNYL (DURAGESIC - DOSED MCG/HR) 50 MCG/HR Apply one patch topically every 72 hours for pain. Remove old patch  . furosemide (LASIX) 40 MG tablet Take 1 tablet (40 mg total) by mouth daily.  Marland Kitchen. guaiFENesin (MUCINEX) 600 MG 12 hr tablet Take 2 tablets (1,200 mg total) by mouth 2 (two) times daily.  Marland Kitchen. ipratropium-albuterol (DUONEB) 0.5-2.5 (3) MG/3ML SOLN Take 3  mLs by nebulization every 6 (six) hours.  Marland Kitchen linagliptin (TRADJENTA) 5 MG TABS tablet Take 1 tablet (5 mg total) by mouth daily.  Marland Kitchen LORazepam (ATIVAN) 0.5 MG tablet Take 0.25 mg by mouth every 6 (six) hours as needed for anxiety. 1/2 tab by mouth every 6 hours as needed  . Multiple Vitamins-Minerals (DECUBI-VITE) CAPS Take 2 capsules by mouth daily.  Marland Kitchen oxyCODONE (ROXICODONE) 15 MG immediate release tablet Take one tablet by mouth every 4 hours as needed for pain  . pantoprazole (PROTONIX) 40 MG tablet Take 1 tablet (40 mg total) by mouth daily at 12 noon.  . pravastatin (PRAVACHOL) 20 MG tablet Take 20 mg by mouth daily at 6 PM.      SIGNIFICANT DIAGNOSTIC EXAMS   LABS REVIEWED:   12-17-13: wbc 8.4; hgb 10.3; hct  33.8; mcv 101.8; plt 265; glucose 117; bun 12; creat 0.5; k+3.4;  na++138 03-13-14: glucose 86; bun 8; creat 0.41; k+3.0; na++141 03-20-14: k+ 3.8  04-19-14: chol 156; ldl 76; trig 102; hdl 60; hgb a1c 5.6      ROS Constitutional: Negative for malaise/fatigue.  Respiratory: Negative for cough and shortness of breath.   Cardiovascular: Negative for chest pain and palpitations.  Gastrointestinal: Negative for heartburn, abdominal pain and constipation.  Musculoskeletal: Negative for myalgias and joint pain.  Skin: Negative.   Psychiatric/Behavioral: The patient is not nervous/anxious.   Physical Exam Constitutional: She is oriented to person, place, and time. No distress.  Obese   Neck: Neck supple. No JVD present.  Cardiovascular: Normal rate and regular rhythm.   Respiratory: Effort normal and breath sounds normal. No respiratory distress.  GI: Soft. Bowel sounds are normal. She exhibits no distension. There is no tenderness.  Musculoskeletal: She exhibits edema.  Is able to move extremities Left lower extremity without  ace wrap does have trace edema present  Neurological: She is alert and oriented to person, place, and time.  Skin: Skin is warm and dry. She is not diaphoretic.    ASSESSMENT/ PLAN:  Will discharge to home with home health for pt/ot/rn. She will need 02. She will need a hospital bed; her prescriptions have been written for a 30 day supply of her medications. She has a follow up with her pcp.   Time spent with patient 45 minutes.    Synthia Innocent NP [facility]  Contact [phone] Monday through Friday 8am- 5pm  After hours call [phone]

## 2014-07-25 ENCOUNTER — Telehealth: Payer: Self-pay | Admitting: *Deleted

## 2014-07-25 NOTE — Telephone Encounter (Signed)
French Anaracy with [facility] called requesting verbal orders for Child psychotherapistocial Worker for [employer]. I called French Anaracy and informed her to call patient's Primary Provider because [facility]. She agreed.

## 2015-01-03 ENCOUNTER — Emergency Department (HOSPITAL_COMMUNITY): Payer: Medicaid Other

## 2015-01-03 ENCOUNTER — Encounter (HOSPITAL_COMMUNITY): Payer: Self-pay | Admitting: Emergency Medicine

## 2015-01-03 ENCOUNTER — Inpatient Hospital Stay (HOSPITAL_COMMUNITY)
Admission: EM | Admit: 2015-01-03 | Discharge: 2015-01-09 | DRG: 291 | Disposition: A | Discharge status: Home Health | Payer: Medicaid Other | Attending: Pulmonary Disease | Admitting: Pulmonary Disease

## 2015-01-03 DIAGNOSIS — J9602 Acute respiratory failure with hypercapnia: Secondary | ICD-10-CM

## 2015-01-03 DIAGNOSIS — I509 Heart failure, unspecified: Secondary | ICD-10-CM

## 2015-01-03 DIAGNOSIS — E872 Acidosis: Secondary | ICD-10-CM | POA: Diagnosis present

## 2015-01-03 DIAGNOSIS — J9690 Respiratory failure, unspecified, unspecified whether with hypoxia or hypercapnia: Secondary | ICD-10-CM | POA: Diagnosis present

## 2015-01-03 DIAGNOSIS — I5033 Acute on chronic diastolic (congestive) heart failure: Principal | ICD-10-CM | POA: Diagnosis present

## 2015-01-03 DIAGNOSIS — Z7951 Long term (current) use of inhaled steroids: Secondary | ICD-10-CM | POA: Diagnosis not present

## 2015-01-03 DIAGNOSIS — Z9981 Dependence on supplemental oxygen: Secondary | ICD-10-CM | POA: Diagnosis not present

## 2015-01-03 DIAGNOSIS — R197 Diarrhea, unspecified: Secondary | ICD-10-CM | POA: Diagnosis not present

## 2015-01-03 DIAGNOSIS — Z7982 Long term (current) use of aspirin: Secondary | ICD-10-CM

## 2015-01-03 DIAGNOSIS — J690 Pneumonitis due to inhalation of food and vomit: Secondary | ICD-10-CM | POA: Diagnosis present

## 2015-01-03 DIAGNOSIS — G4733 Obstructive sleep apnea (adult) (pediatric): Secondary | ICD-10-CM | POA: Diagnosis present

## 2015-01-03 DIAGNOSIS — E785 Hyperlipidemia, unspecified: Secondary | ICD-10-CM | POA: Diagnosis present

## 2015-01-03 DIAGNOSIS — E119 Type 2 diabetes mellitus without complications: Secondary | ICD-10-CM | POA: Diagnosis present

## 2015-01-03 DIAGNOSIS — R627 Adult failure to thrive: Secondary | ICD-10-CM | POA: Diagnosis present

## 2015-01-03 DIAGNOSIS — Z23 Encounter for immunization: Secondary | ICD-10-CM | POA: Diagnosis not present

## 2015-01-03 DIAGNOSIS — Z7984 Long term (current) use of oral hypoglycemic drugs: Secondary | ICD-10-CM | POA: Diagnosis not present

## 2015-01-03 DIAGNOSIS — Z6839 Body mass index (BMI) 39.0-39.9, adult: Secondary | ICD-10-CM

## 2015-01-03 DIAGNOSIS — Z7401 Bed confinement status: Secondary | ICD-10-CM

## 2015-01-03 DIAGNOSIS — I110 Hypertensive heart disease with heart failure: Secondary | ICD-10-CM | POA: Diagnosis present

## 2015-01-03 DIAGNOSIS — Z981 Arthrodesis status: Secondary | ICD-10-CM

## 2015-01-03 DIAGNOSIS — Z66 Do not resuscitate: Secondary | ICD-10-CM | POA: Diagnosis present

## 2015-01-03 DIAGNOSIS — J9620 Acute and chronic respiratory failure, unspecified whether with hypoxia or hypercapnia: Secondary | ICD-10-CM | POA: Diagnosis present

## 2015-01-03 DIAGNOSIS — I5032 Chronic diastolic (congestive) heart failure: Secondary | ICD-10-CM | POA: Diagnosis not present

## 2015-01-03 DIAGNOSIS — Z9119 Patient's noncompliance with other medical treatment and regimen: Secondary | ICD-10-CM

## 2015-01-03 DIAGNOSIS — I480 Paroxysmal atrial fibrillation: Secondary | ICD-10-CM | POA: Diagnosis present

## 2015-01-03 DIAGNOSIS — G931 Anoxic brain damage, not elsewhere classified: Secondary | ICD-10-CM | POA: Diagnosis present

## 2015-01-03 DIAGNOSIS — G9340 Encephalopathy, unspecified: Secondary | ICD-10-CM | POA: Diagnosis present

## 2015-01-03 DIAGNOSIS — E662 Morbid (severe) obesity with alveolar hypoventilation: Secondary | ICD-10-CM | POA: Diagnosis present

## 2015-01-03 DIAGNOSIS — J9621 Acute and chronic respiratory failure with hypoxia: Secondary | ICD-10-CM | POA: Diagnosis not present

## 2015-01-03 DIAGNOSIS — Z87891 Personal history of nicotine dependence: Secondary | ICD-10-CM | POA: Diagnosis not present

## 2015-01-03 DIAGNOSIS — K219 Gastro-esophageal reflux disease without esophagitis: Secondary | ICD-10-CM | POA: Diagnosis present

## 2015-01-03 DIAGNOSIS — J9622 Acute and chronic respiratory failure with hypercapnia: Secondary | ICD-10-CM | POA: Diagnosis present

## 2015-01-03 DIAGNOSIS — J42 Unspecified chronic bronchitis: Secondary | ICD-10-CM | POA: Diagnosis not present

## 2015-01-03 DIAGNOSIS — Z79899 Other long term (current) drug therapy: Secondary | ICD-10-CM | POA: Diagnosis not present

## 2015-01-03 DIAGNOSIS — J9600 Acute respiratory failure, unspecified whether with hypoxia or hypercapnia: Secondary | ICD-10-CM

## 2015-01-03 DIAGNOSIS — I5022 Chronic systolic (congestive) heart failure: Secondary | ICD-10-CM | POA: Diagnosis not present

## 2015-01-03 DIAGNOSIS — J9610 Chronic respiratory failure, unspecified whether with hypoxia or hypercapnia: Secondary | ICD-10-CM | POA: Diagnosis present

## 2015-01-03 DIAGNOSIS — J438 Other emphysema: Secondary | ICD-10-CM | POA: Diagnosis present

## 2015-01-03 DIAGNOSIS — J441 Chronic obstructive pulmonary disease with (acute) exacerbation: Secondary | ICD-10-CM | POA: Diagnosis present

## 2015-01-03 DIAGNOSIS — R0602 Shortness of breath: Secondary | ICD-10-CM | POA: Diagnosis present

## 2015-01-03 LAB — BLOOD GAS, ARTERIAL
Acid-Base Excess: 18.1 mmol/L — ABNORMAL HIGH (ref 0.0–2.0)
BICARBONATE: 47 meq/L — AB (ref 20.0–24.0)
Delivery systems: POSITIVE
Drawn by: 246861
Expiratory PAP: 5
FIO2: 0.4
Inspiratory PAP: 5
O2 Saturation: 95.7 %
PATIENT TEMPERATURE: 98.6
PH ART: 7.167 — AB (ref 7.350–7.450)
TCO2: 51.2 mmol/L (ref 0–100)
pCO2 arterial: 135 mmHg (ref 35.0–45.0)
pO2, Arterial: 95.2 mmHg (ref 80.0–100.0)

## 2015-01-03 LAB — CREATININE, SERUM
CREATININE: 0.67 mg/dL (ref 0.44–1.00)
GFR calc Af Amer: >60 mL/min (ref >60)
GFR calc non Af Amer: >60 mL/min (ref >60)

## 2015-01-03 LAB — CBC WITH DIFFERENTIAL/PLATELET
Basophils Absolute: 0 10*3/uL (ref 0.0–0.1)
Basophils Relative: 0 %
EOS PCT: 2 %
Eosinophils Absolute: 0.3 10*3/uL (ref 0.0–0.7)
HCT: 39.6 % (ref 36.0–46.0)
Hemoglobin: 10.6 g/dL — ABNORMAL LOW (ref 12.0–15.0)
Lymphocytes Relative: 19 %
Lymphs Abs: 2.4 10*3/uL (ref 0.7–4.0)
MCH: 29 pg (ref 26.0–34.0)
MCHC: 26.8 g/dL — ABNORMAL LOW (ref 30.0–36.0)
MCV: 108.2 fL — ABNORMAL HIGH (ref 78.0–100.0)
MONO ABS: 0.9 10*3/uL (ref 0.1–1.0)
Monocytes Relative: 7 %
Neutro Abs: 9 10*3/uL — ABNORMAL HIGH (ref 1.7–7.7)
Neutrophils Relative %: 72 %
PLATELETS: 267 10*3/uL (ref 150–400)
RBC: 3.66 MIL/uL — ABNORMAL LOW (ref 3.87–5.11)
RDW: 13.6 % (ref 11.5–15.5)
WBC: 12.7 10*3/uL — ABNORMAL HIGH (ref 4.0–10.5)

## 2015-01-03 LAB — LIPASE, BLOOD: Lipase: 16 U/L — ABNORMAL LOW (ref 22–51)

## 2015-01-03 LAB — BASIC METABOLIC PANEL
Anion gap: 4 — ABNORMAL LOW (ref 5–15)
BUN: 10 mg/dL (ref 6–20)
CO2: 49 mmol/L — AB (ref 22–32)
Calcium: 9.1 mg/dL (ref 8.9–10.3)
Chloride: 91 mmol/L — ABNORMAL LOW (ref 101–111)
Creatinine, Ser: 0.53 mg/dL (ref 0.44–1.00)
GFR calc Af Amer: >60 mL/min (ref >60)
GLUCOSE: 166 mg/dL — AB (ref 65–99)
POTASSIUM: 4.4 mmol/L (ref 3.5–5.1)
Sodium: 144 mmol/L (ref 135–145)

## 2015-01-03 LAB — I-STAT ARTERIAL BLOOD GAS, ED
Acid-Base Excess: 17 mmol/L — ABNORMAL HIGH (ref 0.0–2.0)
Bicarbonate: 48.6 mEq/L — ABNORMAL HIGH (ref 20.0–24.0)
O2 SAT: 96 %
PCO2 ART: 96.8 mmHg — AB (ref 35.0–45.0)
PO2 ART: 99 mmHg (ref 80.0–100.0)
Patient temperature: 98.6
pH, Arterial: 7.309 — ABNORMAL LOW (ref 7.350–7.450)

## 2015-01-03 LAB — CBC
HCT: 41.7 % (ref 36.0–46.0)
Hemoglobin: 11.4 g/dL — ABNORMAL LOW (ref 12.0–15.0)
MCH: 29.4 pg (ref 26.0–34.0)
MCHC: 27.3 g/dL — ABNORMAL LOW (ref 30.0–36.0)
MCV: 107.5 fL — AB (ref 78.0–100.0)
PLATELETS: 244 10*3/uL (ref 150–400)
RBC: 3.88 MIL/uL (ref 3.87–5.11)
RDW: 13.8 % (ref 11.5–15.5)
WBC: 14.5 10*3/uL — AB (ref 4.0–10.5)

## 2015-01-03 LAB — PROTIME-INR
INR: 1.04 (ref 0.00–1.49)
PROTHROMBIN TIME: 13.8 s (ref 11.6–15.2)

## 2015-01-03 LAB — CORTISOL: Cortisol, Plasma: 5.6 ug/dL

## 2015-01-03 LAB — TROPONIN I

## 2015-01-03 LAB — BRAIN NATRIURETIC PEPTIDE: B Natriuretic Peptide: 13 pg/mL (ref 0.0–100.0)

## 2015-01-03 LAB — AMYLASE: Amylase: 69 U/L (ref 28–100)

## 2015-01-03 LAB — LACTIC ACID, PLASMA: LACTIC ACID, VENOUS: 5.5 mmol/L — AB (ref 0.5–2.0)

## 2015-01-03 LAB — MAGNESIUM: Magnesium: 1.9 mg/dL (ref 1.7–2.4)

## 2015-01-03 LAB — MRSA PCR SCREENING: MRSA by PCR: POSITIVE — AB

## 2015-01-03 LAB — PHOSPHORUS: Phosphorus: 3.7 mg/dL (ref 2.5–4.6)

## 2015-01-03 LAB — APTT: APTT: 32 s (ref 24–37)

## 2015-01-03 LAB — PROCALCITONIN: PROCALCITONIN: 0.19 ng/mL

## 2015-01-03 MED ORDER — MAGNESIUM SULFATE IN D5W 10-5 MG/ML-% IV SOLN
1.0000 g | Freq: Once | INTRAVENOUS | Status: AC
  Administered 2015-01-03: 1 g via INTRAVENOUS
  Filled 2015-01-03: qty 100

## 2015-01-03 MED ORDER — METHYLPREDNISOLONE SODIUM SUCC 125 MG IJ SOLR
125.0000 mg | Freq: Once | INTRAMUSCULAR | Status: AC
  Administered 2015-01-03: 125 mg via INTRAVENOUS
  Filled 2015-01-03: qty 2

## 2015-01-03 MED ORDER — IPRATROPIUM BROMIDE 0.02 % IN SOLN
0.5000 mg | Freq: Once | RESPIRATORY_TRACT | Status: AC
  Administered 2015-01-03: 0.5 mg via RESPIRATORY_TRACT
  Filled 2015-01-03: qty 2.5

## 2015-01-03 MED ORDER — MUPIROCIN 2 % EX OINT
1.0000 | TOPICAL_OINTMENT | Freq: Two times a day (BID) | CUTANEOUS | Status: AC
Start: 2015-01-03 — End: 2015-01-08
  Administered 2015-01-03 – 2015-01-08 (×10): 1 via NASAL
  Filled 2015-01-03: qty 22

## 2015-01-03 MED ORDER — FUROSEMIDE 10 MG/ML IJ SOLN
40.0000 mg | Freq: Once | INTRAMUSCULAR | Status: AC
  Administered 2015-01-03: 40 mg via INTRAVENOUS
  Filled 2015-01-03: qty 4

## 2015-01-03 MED ORDER — SODIUM CHLORIDE 0.9 % IV SOLN
3.0000 g | Freq: Four times a day (QID) | INTRAVENOUS | Status: DC
  Administered 2015-01-03 – 2015-01-06 (×13): 3 g via INTRAVENOUS
  Filled 2015-01-03 (×16): qty 3

## 2015-01-03 MED ORDER — SODIUM CHLORIDE 0.9 % IV SOLN
INTRAVENOUS | Status: DC
  Administered 2015-01-03: 12:00:00 via INTRAVENOUS

## 2015-01-03 MED ORDER — LEVALBUTEROL HCL 0.63 MG/3ML IN NEBU: 0.6300 mg | INHALATION_SOLUTION | RESPIRATORY_TRACT | Status: DC | PRN

## 2015-01-03 MED ORDER — IPRATROPIUM BROMIDE 0.02 % IN SOLN
0.5000 mg | Freq: Four times a day (QID) | RESPIRATORY_TRACT | Status: DC
  Administered 2015-01-03 – 2015-01-08 (×19): 0.5 mg via RESPIRATORY_TRACT
  Filled 2015-01-03 (×19): qty 2.5

## 2015-01-03 MED ORDER — CHLORHEXIDINE GLUCONATE CLOTH 2 % EX PADS
6.0000 | MEDICATED_PAD | Freq: Every day | CUTANEOUS | Status: AC
  Administered 2015-01-04 – 2015-01-08 (×4): 6 via TOPICAL

## 2015-01-03 MED ORDER — METHYLPREDNISOLONE SODIUM SUCC 40 MG IJ SOLR
40.0000 mg | Freq: Two times a day (BID) | INTRAMUSCULAR | Status: DC
  Administered 2015-01-03 – 2015-01-06 (×7): 40 mg via INTRAVENOUS
  Filled 2015-01-03 (×8): qty 1

## 2015-01-03 MED ORDER — ACETAMINOPHEN 325 MG PO TABS
650.0000 mg | ORAL_TABLET | ORAL | Status: DC | PRN
  Administered 2015-01-03 – 2015-01-09 (×11): 650 mg via ORAL
  Filled 2015-01-03 (×11): qty 2

## 2015-01-03 MED ORDER — LEVALBUTEROL HCL 0.63 MG/3ML IN NEBU
0.6300 mg | INHALATION_SOLUTION | Freq: Four times a day (QID) | RESPIRATORY_TRACT | Status: DC
  Administered 2015-01-03 – 2015-01-08 (×19): 0.63 mg via RESPIRATORY_TRACT
  Filled 2015-01-03 (×42): qty 3

## 2015-01-03 MED ORDER — ALBUTEROL (5 MG/ML) CONTINUOUS INHALATION SOLN
10.0000 mg/h | INHALATION_SOLUTION | RESPIRATORY_TRACT | Status: DC
  Administered 2015-01-03: 10 mg/h via RESPIRATORY_TRACT
  Filled 2015-01-03: qty 20

## 2015-01-03 MED ORDER — PANTOPRAZOLE SODIUM 40 MG IV SOLR
40.0000 mg | Freq: Every day | INTRAVENOUS | Status: DC
  Administered 2015-01-03 – 2015-01-05 (×3): 40 mg via INTRAVENOUS
  Filled 2015-01-03 (×4): qty 40

## 2015-01-03 MED ORDER — HEPARIN SODIUM (PORCINE) 5000 UNIT/ML IJ SOLN
5000.0000 [IU] | Freq: Three times a day (TID) | INTRAMUSCULAR | Status: DC
  Administered 2015-01-03 – 2015-01-09 (×18): 5000 [IU] via SUBCUTANEOUS
  Filled 2015-01-03 (×22): qty 1

## 2015-01-03 NOTE — ED Provider Notes (Signed)
CSN: [id number]     Arrival date & time 01/03/15  4098 History   First MD Initiated Contact with Patient 01/03/15 [phone]     Chief Complaint  Patient presents with  . Shortness of Breath     (Consider location/radiation/quality/duration/timing/severity/associated sxs/prior Treatment) HPI   [name] is a(n) 64 y.o. female who presents to the ED  Via EMS for SOB. PMH of COPD, Diastolic HF, , AFIb. And chronic resp failure, previous CVA. Patient called EMS for complaint of shortness of breath. She states that she has had worsening sob over the past week. She has been using her home nebulizer treatments without improvement. She states that she was recently started on a "water pill." Review of her home medications shows that this may be spironolactone. The patient lives at home. She states that her son is her primary caretaker. According to EMS, the patient's home conditions or extremely poor and filthy. EMS personnel state that they feel that her son is likely MR. They're highly concerned with her living conditions. The patient is not able to ambulate. She states because of her previous left ankle ORIF. She denies fevers, chest pain.  Past Medical History  Diagnosis Date  . Asthma     with exaerbation and admission in 5/12. no h/ intubation. Is also suspected to have some degree of restrictive pattern and moderate emphysema  (per Dr. Teddy Spike note0 and h/o smoking in past.   . Obesity hypoventilation syndrome     followed with Dr. Stanton Kidney ([facility]) once in 2012.   Marland Kitchen OSA (obstructive sleep apnea)     on CPAP qhs. Sleep study in 2009 confirmed. Not very compliant with CPAP  . Diastolic CHF     Echo 2009 confirmed (poor acoustic window) , EF 55%,, mild RVH  . Physical deconditioning     wheelchair bound, unable to perforrm ADLs at home  . HLD (hyperlipidemia)   . COPD (chronic obstructive pulmonary disease)     uses O2 4L   . Dysrhythmia     atrial fib  . Diabetes mellitus without  complication   . Anxiety   . Chronic respiratory failure    Past Surgical History  Procedure Laterality Date  . Cesarean section    . Ankle fusion Left 01/18/2014    Procedure: Left Tibiocalcaneal Fusion;  Surgeon: Nadara Mustard, MD;  Location: [facility] OR;  Service: Orthopedics;  Laterality: Left;   Family History  Problem Relation Age of Onset  . COPD Father    Social History  Substance Use Topics  . Smoking status: Former Smoker -- 1.00 packs/day for 30 years    Types: Cigarettes    Quit date: 04/05/1993  . Smokeless tobacco: Never Used  . Alcohol Use: No     Comment: " drank beer years ago "   OB History    No data available     Review of Systems  Ten systems reviewed and are negative for acute change, except as noted in the HPI.    Allergies  Codeine  Home Medications   Prior to Admission medications   Medication Sig Start Date End Date Taking? Authorizing Provider  acetaminophen (TYLENOL) 500 MG tablet Take 1,000 mg by mouth every 6 (six) hours as needed for mild pain.     Historical Provider, MD  albuterol (PROVENTIL HFA;VENTOLIN HFA) 108 (90 BASE) MCG/ACT inhaler Inhale 2 puffs into the lungs every 6 (six) hours as needed for wheezing or shortness of breath.  Historical Provider, MD  arformoterol (BROVANA) 15 MCG/2ML NEBU Take 2 mLs (15 mcg total) by nebulization 2 (two) times daily. 12/03/13   Zannie Cove, MD  aspirin EC 81 MG tablet Take 81 mg by mouth daily.    Historical Provider, MD  budesonide (PULMICORT) 0.5 MG/2ML nebulizer solution Take 2 mLs (0.5 mg total) by nebulization 2 (two) times daily. 12/03/13   Zannie Cove, MD  calcium carbonate (TUMS - DOSED IN MG ELEMENTAL CALCIUM) 500 MG chewable tablet Chew 1 tablet (200 mg of elemental calcium total) by mouth 3 (three) times daily as needed for indigestion or heartburn. 12/03/13   Zannie Cove, MD  diltiazem (CARDIZEM CD) 180 MG 24 hr capsule Take 1 capsule (180 mg total) by mouth daily. 12/03/13   Zannie Cove, MD  fentaNYL (DURAGESIC - DOSED MCG/HR) 50 MCG/HR Apply one patch topically every 72 hours for pain. Remove old patch 04/02/14   Oneal Grout, MD  furosemide (LASIX) 40 MG tablet Take 1 tablet (40 mg total) by mouth daily. 12/03/13   Zannie Cove, MD  guaiFENesin (MUCINEX) 600 MG 12 hr tablet Take 2 tablets (1,200 mg total) by mouth 2 (two) times daily. 12/03/13   Zannie Cove, MD  ipratropium-albuterol (DUONEB) 0.5-2.5 (3) MG/3ML SOLN Take 3 mLs by nebulization every 6 (six) hours. 12/03/13   Zannie Cove, MD  linagliptin (TRADJENTA) 5 MG TABS tablet Take 1 tablet (5 mg total) by mouth daily. 12/04/13   Tiffany L Reed, DO  LORazepam (ATIVAN) 0.5 MG tablet Take 0.25 mg by mouth every 6 (six) hours as needed for anxiety. 1/2 tab by mouth every 6 hours as needed    Historical Provider, MD  Multiple Vitamins-Minerals (DECUBI-VITE) CAPS Take 2 capsules by mouth daily.    Historical Provider, MD  oxyCODONE (ROXICODONE) 15 MG immediate release tablet Take one tablet by mouth every 4 hours as needed for pain 05/01/14   Kimber Relic, MD  pantoprazole (PROTONIX) 40 MG tablet Take 1 tablet (40 mg total) by mouth daily at 12 noon. 12/03/13   Zannie Cove, MD  pravastatin (PRAVACHOL) 20 MG tablet Take 20 mg by mouth daily at 6 PM.     Historical Provider, MD   BP 113/89 mmHg  Pulse 109  Resp 21  SpO2 93% Physical Exam  Constitutional: She is oriented to person, place, and time. She appears distressed.  Appears poorly cared for. Patient smells of feces.  HENT:  Head: Normocephalic and atraumatic.  Eyes: Conjunctivae and EOM are normal. Pupils are equal, round, and reactive to light.  Neck: No JVD present.  Cardiovascular:  No pitting edema. Tachycardic  Pulmonary/Chest: She is in respiratory distress. She has no wheezes. She has no rales. She exhibits no tenderness.  Patient with labored breathing. Speaking in very short staccato distend sentences. Minimal air movement.   Abdominal: Soft.  Bowel sounds are normal. She exhibits no distension and no mass.  Neurological: She is alert and oriented to person, place, and time.  Skin:  Patient's feet with thick scaling. Poorly cared for.  Our curling under into the toes.  Nursing note and vitals reviewed.   ED Course  CRITICAL CARE Performed by: Arthor Captain Authorized by: Arthor Captain Total critical care time: 60 minutes Critical care time was exclusive of separately billable procedures and treating other patients. Critical care was necessary to treat or prevent imminent or life-threatening deterioration of the following conditions: respiratory failure. Critical care was time spent personally by me on the following activities:  evaluation of patient's response to treatment, examination of patient, obtaining history from patient or surrogate, ordering and performing treatments and interventions, ordering and review of laboratory studies, ordering and review of radiographic studies, pulse oximetry, re-evaluation of patient's condition and review of old charts.   (including critical care time) Labs Review Labs Reviewed  CBC WITH DIFFERENTIAL/PLATELET - Abnormal; Notable for the following:    WBC 12.7 (*)    RBC 3.66 (*)    Hemoglobin 10.6 (*)    MCV 108.2 (*)    MCHC 26.8 (*)    Neutro Abs 9.0 (*)    All other components within normal limits  BASIC METABOLIC PANEL - Abnormal; Notable for the following:    Chloride 91 (*)    CO2 49 (*)    Glucose, Bld 166 (*)    Anion gap 4 (*)    All other components within normal limits  BLOOD GAS, ARTERIAL - Abnormal; Notable for the following:    pH, Arterial 7.167 (*)    pCO2 arterial 135 (*)    Bicarbonate 47.0 (*)    Acid-Base Excess 18.1 (*)    All other components within normal limits  LACTIC ACID, PLASMA - Abnormal; Notable for the following:    Lactic Acid, Venous 5.5 (*)    All other components within normal limits  CBC - Abnormal; Notable for the following:    WBC  14.5 (*)    Hemoglobin 11.4 (*)    MCV 107.5 (*)    MCHC 27.3 (*)    All other components within normal limits  LIPASE, BLOOD - Abnormal; Notable for the following:    Lipase 16 (*)    All other components within normal limits  I-STAT ARTERIAL BLOOD GAS, ED - Abnormal; Notable for the following:    pH, Arterial 7.309 (*)    pCO2 arterial 96.8 (*)    Bicarbonate 48.6 (*)    Acid-Base Excess 17.0 (*)    All other components within normal limits  CULTURE, BLOOD (ROUTINE X 2)  CULTURE, BLOOD (ROUTINE X 2)  URINE CULTURE  BRAIN NATRIURETIC PEPTIDE  TROPONIN I  MAGNESIUM  PHOSPHORUS  PROCALCITONIN  CORTISOL  PROTIME-INR  APTT  CREATININE, SERUM  AMYLASE  STREP PNEUMONIAE URINARY ANTIGEN    Imaging Review Dg Chest Portable 1 View  01/03/2015   CLINICAL DATA:  Respiratory distress.  Morbid obesity.  Sleep apnea.  EXAM: PORTABLE CHEST 1 VIEW  COMPARISON:  01/18/2014  FINDINGS: Patient is partially rotated to the right. Heart size is within normal limits. No evidence of acute infiltrate or edema. No evidence of pleural effusion.  IMPRESSION: No acute findings.   Electronically Signed   By: Myles Rosenthal M.D.   On: 01/03/2015 07:47   I have personally reviewed and evaluated these images and lab results as part of my medical decision-making.   EKG Interpretation   Date/Time:  Friday January 03 2015 06:45:36 EDT Ventricular Rate:  110 PR Interval:  157 QRS Duration: 85 QT Interval:  344 QTC Calculation: 465 R Axis:   70 Text Interpretation:  Sinus tachycardia Ventricular premature complex  Aberrant conduction of SV complex(es) Low voltage, precordial leads  Confirmed by [facility] MD, Barbara Cower [phone]) on 01/03/2015 6:49:37 AM        MDM   Final diagnoses:  Acute respiratory failure with hypercapnia  Acute encephalopathy   3:55 PM BP 113/89 mmHg  Pulse 109  Resp 21  SpO2 93% Patient here with SOB, resp distress. Tachycardic, tachypneic and  labored. Patient seen in shared  visit with attending physician. Patient 86% on RA. Given duoneb by EMS with some improvement. EKG without signs of ischemia.  Patient on bipap. Alert AND oriented x3. Patient refused intervention until cleaned up b/c she has made a stool. Nurse reports that the patient had caked and matted feces on her body and sheets that  Appear old.     3:55 PM  Patient is increasingly somnolent. Now only oriented to person and place. She falls asleep in between her sentences. Bipap applied immediately Will reassess at 8:30 am ABG shows C02 >100, pH 7.195 Patient is hypercarbic but pH fairly well compensated in consideration of her high CO2  . This appears to be acute on chronic resp failure.  3:55 PM BP 113/89 mmHg  Pulse 109  Resp 21  SpO2 93% Patient is still confused and somnolent after 1/2 hour on Bipap. Will repeat ABG to see if she has improvement    3:55 PM Patient sitting up, asking for water, much more alert. She is still on Bipap Initial  And repeat ABG values  pH 7.149, 7.181 PCO2 >100, >100 Pa02 259, 95  3:55 PM I consulted with Hospitalist who requests PCCM consult. They will admit the patient. She is stabilizing  And continues to remain alert.  Arthor Captain, PA-C 01/03/15 1555  Shon Baton, MD 02/10/15 [phone]

## 2015-01-03 NOTE — Progress Notes (Signed)
Notified by lab that patient tested positive for MRSA.  Initiated contact precautions and order set.

## 2015-01-03 NOTE — ED Notes (Signed)
CCM notified of pt lactic acid. Pt is much more alert and responsive, requesting to have Bipap removed. Per Dr. Tyson Alias, pt is able to be removed from bipap for 1-2 hours and then to go back on for 4 hours.

## 2015-01-03 NOTE — Progress Notes (Signed)
Took patient off of BiPAP for 2 hours then back on BiPAP for 4 hours per MD's order.

## 2015-01-03 NOTE — H&P (Signed)
PULMONARY / CRITICAL CARE MEDICINE   Name: [name] MRN: [id number] DOB: 08/08/1950    ADMISSION DATE:  01/03/2015   REFERRING MD : EDP  CHIEF COMPLAINT:  Esp failure acute on chronic  INITIAL PRESENTATION: AMS  STUDIES:    SIGNIFICANT EVENTS: 9/30 DNR   HISTORY OF PRESENT ILLNESS:    [name] with a plethora of health issues that include but limited to, OSA,OHS,Chroinc resp failure, O2 dependent, hx of intubation with difficulty liberating from vent, MO, CHF, bilat fx ankles that have made her bed bound, general FTT. She presents with acute hypercarbic resp failure, purulent sputum x 1 week. She initially had PH 7.2 with PCo2 130 and was started on bipap. She is now awake and does not want to be intubated ever again and was made a DNR per her request. WSe will admit to SDU on bipap, tx underlying infection, diureses as tolerated and use NIMVS to bridge her through AECOPD episode. Note she has been followed by Dr. Shelle Iron in the past. PAST MEDICAL HISTORY :   has a past medical history of Asthma; Obesity hypoventilation syndrome; OSA (obstructive sleep apnea); Diastolic CHF; Physical deconditioning; HLD (hyperlipidemia); COPD (chronic obstructive pulmonary disease); Dysrhythmia; Diabetes mellitus without complication; Anxiety; and Chronic respiratory failure.  has past surgical history that includes Cesarean section and Ankle fusion (Left, 01/18/2014). Prior to Admission medications   Medication Sig Start Date End Date Taking? Authorizing Provider  acetaminophen (TYLENOL) 500 MG tablet Take 1,000 mg by mouth every 6 (six) hours as needed for mild pain.     Historical Provider, MD  albuterol (PROVENTIL HFA;VENTOLIN HFA) 108 (90 BASE) MCG/ACT inhaler Inhale 2 puffs into the lungs every 6 (six) hours as needed for wheezing or shortness of breath.     Historical Provider, MD  arformoterol (BROVANA) 15 MCG/2ML NEBU Take 2 mLs (15 mcg total) by nebulization 2 (two) times daily. 12/03/13    Zannie Cove, MD  aspirin EC 81 MG tablet Take 81 mg by mouth daily.    Historical Provider, MD  budesonide (PULMICORT) 0.5 MG/2ML nebulizer solution Take 2 mLs (0.5 mg total) by nebulization 2 (two) times daily. 12/03/13   Zannie Cove, MD  calcium carbonate (TUMS - DOSED IN MG ELEMENTAL CALCIUM) 500 MG chewable tablet Chew 1 tablet (200 mg of elemental calcium total) by mouth 3 (three) times daily as needed for indigestion or heartburn. 12/03/13   Zannie Cove, MD  diltiazem (CARDIZEM CD) 180 MG 24 hr capsule Take 1 capsule (180 mg total) by mouth daily. 12/03/13   Zannie Cove, MD  fentaNYL (DURAGESIC - DOSED MCG/HR) 50 MCG/HR Apply one patch topically every 72 hours for pain. Remove old patch 04/02/14   Oneal Grout, MD  furosemide (LASIX) 40 MG tablet Take 1 tablet (40 mg total) by mouth daily. 12/03/13   Zannie Cove, MD  guaiFENesin (MUCINEX) 600 MG 12 hr tablet Take 2 tablets (1,200 mg total) by mouth 2 (two) times daily. 12/03/13   Zannie Cove, MD  ipratropium-albuterol (DUONEB) 0.5-2.5 (3) MG/3ML SOLN Take 3 mLs by nebulization every 6 (six) hours. 12/03/13   Zannie Cove, MD  linagliptin (TRADJENTA) 5 MG TABS tablet Take 1 tablet (5 mg total) by mouth daily. 12/04/13   Tiffany L Reed, DO  LORazepam (ATIVAN) 0.5 MG tablet Take 0.25 mg by mouth every 6 (six) hours as needed for anxiety. 1/2 tab by mouth every 6 hours as needed    Historical Provider, MD  Multiple Vitamins-Minerals (DECUBI-VITE) CAPS  Take 2 capsules by mouth daily.    Historical Provider, MD  oxyCODONE (ROXICODONE) 15 MG immediate release tablet Take one tablet by mouth every 4 hours as needed for pain 05/01/14   Kimber Relic, MD  pantoprazole (PROTONIX) 40 MG tablet Take 1 tablet (40 mg total) by mouth daily at 12 noon. 12/03/13   Zannie Cove, MD  pravastatin (PRAVACHOL) 20 MG tablet Take 20 mg by mouth daily at 6 PM.     Historical Provider, MD   Allergies  Allergen Reactions  . Codeine Nausea And Vomiting     FAMILY HISTORY:  has no family status information on file.  SOCIAL HISTORY:  reports that she quit smoking about 21 years ago. Her smoking use included Cigarettes. She has a 30 pack-year smoking history. She has never used smokeless tobacco. She reports that she does not drink alcohol or use illicit drugs.  REVIEW OF SYSTEMS:  10 point review of system taken, please see HPI for positives and negatives.   SUBJECTIVE:   VITAL SIGNS: Pulse Rate:  [102-111] 111 (09/30 1015) Resp:  [15-32] 16 (09/30 1015) BP: (110-140)/(48-89) 128/75 mmHg (09/30 1015) SpO2:  [86 %-100 %] 99 % (09/30 1015) FiO2 (%):  [100 %] 100 % (09/30 0738) HEMODYNAMICS:   VENTILATOR SETTINGS: Vent Mode:  [-]  FiO2 (%):  [100 %] 100 % INTAKE / OUTPUT:  Intake/Output Summary (Last 24 hours) at 01/03/15 1108 Last data filed at 01/03/15 0913  Gross per 24 hour  Intake    100 ml  Output      0 ml  Net    100 ml    PHYSICAL EXAMINATION: General:  MOAAF lucid and interactive Neuro:Follows commands, clearly does not want intubation and cpr HEENT:  Curently on bipap Cardiovascular:  HSRv RRR Lungs:  + exp wheeze, decrease bs bases Abdomen: obese, + bs Musculoskeletal:  Lower ext with contractures Skin:  warfm and dry  LABS:  CBC  Recent Labs Lab 01/03/15 0715  WBC 12.7*  HGB 10.6*  HCT 39.6  PLT 267   Coag's No results for input(s): APTT, INR in the last 168 hours. BMET  Recent Labs Lab 01/03/15 0715  NA 144  K 4.4  CL 91*  CO2 49*  BUN 10  CREATININE 0.53  GLUCOSE 166*   Electrolytes  Recent Labs Lab 01/03/15 0715  CALCIUM 9.1   Sepsis Markers No results for input(s): LATICACIDVEN, PROCALCITON, O2SATVEN in the last 168 hours. ABG  Recent Labs Lab 01/03/15 1015  PHART 7.167*  PCO2ART 135*  PO2ART 95.2   Liver Enzymes No results for input(s): AST, ALT, ALKPHOS, BILITOT, ALBUMIN in the last 168 hours. Cardiac Enzymes  Recent Labs Lab 01/03/15 0715  TROPONINI <0.03    Glucose No results for input(s): GLUCAP in the last 168 hours.  Imaging Dg Chest Portable 1 View  01/03/2015   CLINICAL DATA:  Respiratory distress.  Morbid obesity.  Sleep apnea.  EXAM: PORTABLE CHEST 1 VIEW  COMPARISON:  01/18/2014  FINDINGS: Patient is partially rotated to the right. Heart size is within normal limits. No evidence of acute infiltrate or edema. No evidence of pleural effusion.  IMPRESSION: No acute findings.   Electronically Signed   By: Myles Rosenthal M.D.   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   A:   No acute issue P:   Follow cbg while on steroids  NEUROLOGIC A:   Currently awake and lucid P:   RASS goal: 1 Hold all sedatives NIMVS to lower Pco2   FAMILY  - Updates: Note her caregiver is mentally challenged. She makes her own decisions.  - Inter-disciplinary family meet or Palliative Care meeting due by:  day 7    TODAY'S SUMMARY: [name] with a plethora of health issues that include but limited to, OSA,OHS,Chroinc resp failure, O2 dependent, hx of intubation with difficulty liberating from vent, MO, CHF, bilat fx ankles that have made her bed bound, general FTT. She presents with acute hypercarbic resp failure, purulent sputum x 1 week. She initially had PH 7.2 with PCo2 130 and was started on  bipap. She is now awake and does not want to be intubated ever again and was made a DNR per her request. WSe will admit to SDU on bipap, tx underlying infection, diureses as tolerated and use NIMVS to bridge her through AECOPD episode.   Brett Canales Baraka Klatt ACNP [facility] Pager [phone] till 3 pm If no answer page [phone] 01/03/2015, 11:22 AM

## 2015-01-03 NOTE — ED Notes (Signed)
Pt arrives from home via [facility] c/o SOB. EMS reports minimal breath sounds with wheezing.  Pt received own neb tx at home.  EMS reports giving 2 DuoNebs en route.  Initial O2 Sats  Less than 50%, EMS reports good color, pt speaking in 1-2 word sentences, no diaphoresis.  After 2nd DuoNeb pt satting at 100% speaking in full sentences.  Pt 86% on RA upon arrival.  Dr Wilkie Aye at bedside.

## 2015-01-03 NOTE — ED Notes (Signed)
CCM at bedside 

## 2015-01-03 NOTE — ED Notes (Signed)
CRITICAL VALUE ALERT  Critical value received:  1512  Date of notification:  01/03/2015  Time of notification:  1512  Critical value read back: YES  Nurse who received alert:  Harlow Asa RN

## 2015-01-03 NOTE — Progress Notes (Signed)
eLink Physician-Brief Progress Note Patient Name: AGATHA DUPLECHAIN DOB: May 27, 1950 MRN: [id number]   Date of Service  01/03/2015  HPI/Events of Note  lactxi cnoted, d/w pccm This is chf exacerbation, la from hypoxia, resp failure  eICU Interventions          Nelda Bucks. 01/03/2015, 4:40 PM

## 2015-01-04 ENCOUNTER — Other Ambulatory Visit: Payer: Self-pay | Admitting: Internal Medicine

## 2015-01-04 DIAGNOSIS — J9622 Acute and chronic respiratory failure with hypercapnia: Secondary | ICD-10-CM

## 2015-01-04 DIAGNOSIS — J9621 Acute and chronic respiratory failure with hypoxia: Secondary | ICD-10-CM

## 2015-01-04 LAB — BLOOD GAS, ARTERIAL
ACID-BASE EXCESS: 20.9 mmol/L — AB (ref 0.0–2.0)
Bicarbonate: 48 mEq/L — ABNORMAL HIGH (ref 20.0–24.0)
DRAWN BY: 418751
Delivery systems: POSITIVE
Expiratory PAP: 8
FIO2: 0.6
INSPIRATORY PAP: 16
O2 SAT: 98.6 %
PCO2 ART: 95.3 mmHg — AB (ref 35.0–45.0)
PH ART: 7.323 — AB (ref 7.350–7.450)
Patient temperature: 98.6
RATE: 15 resp/min
TCO2: 50.9 mmol/L (ref 0–100)
pO2, Arterial: 128 mmHg — ABNORMAL HIGH (ref 80.0–100.0)

## 2015-01-04 LAB — CBC
HCT: 38.7 % (ref 36.0–46.0)
Hemoglobin: 10.8 g/dL — ABNORMAL LOW (ref 12.0–15.0)
MCH: 29.5 pg (ref 26.0–34.0)
MCHC: 27.9 g/dL — ABNORMAL LOW (ref 30.0–36.0)
MCV: 105.7 fL — AB (ref 78.0–100.0)
PLATELETS: 252 10*3/uL (ref 150–400)
RBC: 3.66 MIL/uL — AB (ref 3.87–5.11)
RDW: 14.1 % (ref 11.5–15.5)
WBC: 12 10*3/uL — AB (ref 4.0–10.5)

## 2015-01-04 LAB — GLUCOSE, CAPILLARY: Glucose-Capillary: 139 mg/dL — ABNORMAL HIGH (ref 65–99)

## 2015-01-04 MED ORDER — CETYLPYRIDINIUM CHLORIDE 0.05 % MT LIQD
7.0000 mL | Freq: Two times a day (BID) | OROMUCOSAL | Status: DC
  Administered 2015-01-04 – 2015-01-08 (×7): 7 mL via OROMUCOSAL

## 2015-01-04 MED ORDER — INFLUENZA VAC SPLIT QUAD 0.5 ML IM SUSY
0.5000 mL | PREFILLED_SYRINGE | INTRAMUSCULAR | Status: AC
  Administered 2015-01-09: 0.5 mL via INTRAMUSCULAR
  Filled 2015-01-04 (×2): qty 0.5

## 2015-01-04 NOTE — Evaluation (Signed)
Clinical/Bedside Swallow Evaluation Patient Details  Name: CANDA PODGORSKI MRN: [id number] Date of Birth: [birth year]  Today's Date: 01/04/2015 Time: SLP Start Time (ACUTE ONLY): 0945 SLP Stop Time (ACUTE ONLY): 1010 SLP Time Calculation (min) (ACUTE ONLY): 25 min  Past Medical History:  Past Medical History  Diagnosis Date  . Asthma     with exaerbation and admission in 5/12. no h/ intubation. Is also suspected to have some degree of restrictive pattern and moderate emphysema  (per Dr. Teddy Spike note0 and h/o smoking in past.   . Obesity hypoventilation syndrome     followed with Dr. Stanton Kidney ([facility]) once in 2012.   Marland Kitchen OSA (obstructive sleep apnea)     on CPAP qhs. Sleep study in 2009 confirmed. Not very compliant with CPAP  . Diastolic CHF     Echo 2009 confirmed (poor acoustic window) , EF 55%,, mild RVH  . Physical deconditioning     wheelchair bound, unable to perforrm ADLs at home  . HLD (hyperlipidemia)   . COPD (chronic obstructive pulmonary disease)     uses O2 4L   . Dysrhythmia     atrial fib  . Diabetes mellitus without complication   . Anxiety   . Chronic respiratory failure    Past Surgical History:  Past Surgical History  Procedure Laterality Date  . Cesarean section    . Ankle fusion Left 01/18/2014    Procedure: Left Tibiocalcaneal Fusion;  Surgeon: Nadara Mustard, MD;  Location: [facility] OR;  Service: Orthopedics;  Laterality: Left;   HPI:  Pt is a 64 y.o. female with PMH OSA,OHS,Chroinc resp failure, O2 dependent, hx of intubation with difficulty liberating from vent, MO, CHF, bilat fx ankles that have made her bed bound, general FTT. She presented to ED 9/30 with acute hypercarbic resp failure, purulent sputum x 1 week. She initially had PH 7.2 with PCo2 130 and was started on bipap. She is now awake and does not want to be intubated ever again and was made a DNR per her request. Pt has been on and off BiPAP on this admission.There are concerns for possible  aspiration. No acute findings on CXR. Bedside swallow ordered to assist in ruling out aspiration and to identify safest diet.   Assessment / Plan / Recommendation Clinical Impression  Pt showed no overt s/s of aspiration at bedside- swallow appeared timely with adequate hyolaryngeal excursion. Pt reports no difficulties swallowing during meals; expressed occasionally waking up at night and choking on saliva. Pt is at an increased risk of aspiration given decreased respiratory status; however no s/s observed and CXR is clear. Recommend continuing soft diet, thin liquids, meds whole with liquid. Reviewed recommendations with pt and reviewed strategies related to respiratory status- small bites/ sips at a time, chew solid foods thoroughly, take breaks to catch breath between bites. Will sign off at this time; please re-consult if needs arise.    Aspiration Risk  Mild    Diet Recommendation Dysphagia 3 (Mech soft);Thin   Medication Administration: Whole meds with liquid Compensations: Slow rate;Small sips/bites    Other  Recommendations Oral Care Recommendations: Oral care BID   Follow Up Recommendations       Frequency and Duration        Pertinent Vitals/Pain n/a    SLP Swallow Goals     Swallow Study Prior Functional Status       General Other Pertinent Information: Pt is a 64 y.o. female with PMH OSA,OHS,Chroinc resp failure, O2 dependent,  hx of intubation with difficulty liberating from vent, MO, CHF, bilat fx ankles that have made her bed bound, general FTT. She presented to ED 9/30 with acute hypercarbic resp failure, purulent sputum x 1 week. She initially had PH 7.2 with PCo2 130 and was started on bipap. She is now awake and does not want to be intubated ever again and was made a DNR per her request. Pt has been on and off BiPAP on this admission.There are concerns for possible aspiration. No acute findings on CXR. Bedside swallow ordered to assist in ruling out aspiration and  to identify safest diet. Type of Study: Bedside swallow evaluation Diet Prior to this Study: Thin liquids;Other (Comment) (diet soft) Temperature Spikes Noted: No Respiratory Status: Supplemental O2 delivered via (comment) History of Recent Intubation: No Behavior/Cognition: Alert;Cooperative;Pleasant mood Oral Cavity - Dentition: Missing dentition Self-Feeding Abilities: Able to feed self Patient Positioning: Upright in bed Baseline Vocal Quality: Normal Volitional Cough: Strong    Oral/Motor/Sensory Function Overall Oral Motor/Sensory Function: Appears within functional limits for tasks assessed Labial ROM: Within Functional Limits Labial Symmetry: Within Functional Limits Labial Strength: Within Functional Limits Lingual ROM: Within Functional Limits Lingual Symmetry: Within Functional Limits Lingual Strength: Within Functional Limits   Ice Chips Ice chips: Not tested   Thin Liquid Thin Liquid: Within functional limits Presentation: Straw    Nectar Thick Nectar Thick Liquid: Not tested   Honey Thick Honey Thick Liquid: Not tested   Puree Puree: Within functional limits Presentation: Self Fed;Spoon   Solid   GO    Solid: Within functional limits Presentation: Self Krystal Clark, Amy K, MA, CCC-SLP 01/04/2015,10:15 AM [phone]

## 2015-01-04 NOTE — Procedures (Signed)
Pt placed on NIC pressure control for the night. Pt is comfortably and resting well.

## 2015-01-04 NOTE — H&P (Signed)
PULMONARY / CRITICAL CARE MEDICINE   Name: [name] MRN: [id number] DOB: [birth year]    ADMISSION DATE:  01/03/2015   REFERRING MD : EDP  CHIEF COMPLAINT:  Esp failure acute on chronic  INITIAL PRESENTATION: AMS  STUDIES:    SIGNIFICANT EVENTS: 9/30 DNR   HISTORY OF PRESENT ILLNESS:    [name] with a plethora of health issues that include but limited to, OSA,OHS,Chroinc resp failure, O2 dependent, hx of intubation with difficulty liberating from vent, MO, CHF, bilat fx ankles that have made her bed bound, general FTT. She presents with acute hypercarbic resp failure, purulent sputum x 1 week. She initially had PH 7.2 with PCo2 130 and was started on bipap. She is now awake and does not want to be intubated ever again and was made a DNR per her request. WSe will admit to SDU on bipap, tx underlying infection, diureses as tolerated and use NIMVS to bridge her through AECOPD episode. Note she has been followed by Dr. Shelle Iron in the past.   SUBJECTIVE: No acute concerns reported  VITAL SIGNS: Temp:  [98 F (36.7 C)-98.3 F (36.8 C)] 98 F (36.7 C) (10/01 0711) Pulse Rate:  [38-118] 95 (10/01 0723) Resp:  [15-24] 24 (10/01 0723) BP: (104-144)/(48-104) 135/60 mmHg (10/01 0723) SpO2:  [90 %-100 %] 100 % (10/01 0723) FiO2 (%):  [30 %-100 %] 30 % (10/01 0723) Weight:  [104.1 kg (229 lb 8 oz)-105 kg (231 lb 7.7 oz)] 104.1 kg (229 lb 8 oz) (10/01 0500) HEMODYNAMICS:   VENTILATOR SETTINGS: Vent Mode:  [-] BIPAP FiO2 (%):  [30 %-100 %] 30 % Set Rate:  [15 bmp] 15 bmp INTAKE / OUTPUT:  Intake/Output Summary (Last 24 hours) at 01/04/15 0836 Last data filed at 01/04/15 0502  Gross per 24 hour  Intake 757.17 ml  Output      0 ml  Net 757.17 ml    PHYSICAL EXAMINATION: General:  MOAAF lucid and interactive off BIPAP, but somnolent on my first arrival. Neuro:Follows commands, clearly does not want intubation and cpr HEENT:  Curently on bipap, 60% with sat  100% Cardiovascular:  HSRv RRR Lungs:  +minimal exp wheeze, decrease bs bases Abdomen: obese, + bs Musculoskeletal:  Lower ext with contractures Skin:  warfm and dry  LABS:  CBC  Recent Labs Lab 01/03/15 0715 01/03/15 1413 01/04/15 0441  WBC 12.7* 14.5* 12.0*  HGB 10.6* 11.4* 10.8*  HCT 39.6 41.7 38.7  PLT 267 244 252   Coag's  Recent Labs Lab 01/03/15 1413  APTT 32  INR 1.04   BMET  Recent Labs Lab 01/03/15 0715 01/03/15 1413  NA 144  --   K 4.4  --   CL 91*  --   CO2 49*  --   BUN 10  --   CREATININE 0.53 0.67  GLUCOSE 166*  --    Electrolytes  Recent Labs Lab 01/03/15 0715 01/03/15 1413  CALCIUM 9.1  --   MG  --  1.9  PHOS  --  3.7   Sepsis Markers  Recent Labs Lab 01/03/15 1413  LATICACIDVEN 5.5*  PROCALCITON 0.19   ABG  Recent Labs Lab 01/03/15 1015 01/03/15 1429 01/04/15 0315  PHART 7.167* 7.309* 7.323*  PCO2ART 135* 96.8* 95.3*  PO2ART 95.2 99.0 128*   Liver Enzymes No results for input(s): AST, ALT, ALKPHOS, BILITOT, ALBUMIN in the last 168 hours. Cardiac Enzymes  Recent Labs Lab 01/03/15 0715  TROPONINI <0.03   Glucose No results for  input(s): GLUCAP in the last 168 hours.  Imaging No results found.   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 Sat was 100% on  60% FIO2 through BIPAP Discussed w nurse. O2 order modified to cover BIPAP prn, with goal sat 92-96 Doubt pna- no pneumonia evident on cxr. Possible aspiration P:   NIMVS as a bridge BD's, xopenex  Due to PAF Steroids Abx to cover aspiration DC Unasyn abx tomorrow if stable Allow diet Reduce FiO@ to reduce CO2 retention  CARDIOVASCULAR CVL A:  CHF PAF HTN P:  Lasiv IV x 1(cxr looks wet) Daily weights   RENAL A:   No acute issue P:   Follow creatine  GASTROINTESTINAL A:   MO GI protection  P:   PPI Swallow eval  HEMATOLOGIC A:    No acute issue P:    INFECTIOUS A:   Presumed  bronchitis  DC abx 10/2 if stable? P:   BCx2 9/30>> UC  9/30>> Sputum9/30 >> Abx:  9/30 Unasyn(may be aspirating)>>  ENDOCRINE CBG (last 3)  No results for input(s): GLUCAP in the last 72 hours.   A:   No acute issue P:   Follow cbg while on steroids  NEUROLOGIC A:   Currently awake and lucid P:   RASS goal: 1 Hold all sedatives NIMVS to lower Pco2   FAMILY  - Updates: Note her caregiver is mentally challenged. She makes her own decisions.  - Inter-disciplinary family meet or Palliative Care meeting due by:  day 7    TODAY'S SUMMARY: [name] with a plethora of health issues that include but limited to, OSA,OHS,Chroinc resp failure, O2 dependent, hx of intubation with difficulty liberating from vent, MO, CHF, bilat fx ankles that have made her bed bound, general FTT. She presents with acute hypercarbic resp failure, purulent sputum x 1 week. She initially had PH 7.2 with PCo2 130 and was started on bipap. She is now awake and does not want to be intubated ever again and was made a DNR per her request. WSe will admit to SDU on bipap, tx underlying infection, diureses as tolerated and use NIMVS to bridge her through AECOPD episode. 10/1- Somnolent on BIPAP 60% with sat 100%. Quite lucid and chatty off BIPAP. CXR w/op pneumonia. To allow meals.  CD Maple Hudson, MD [facility] Pager [phone] till 3 pm If no answer page [phone] 01/04/2015, 8:36 AM

## 2015-01-04 NOTE — Progress Notes (Signed)
CRITICAL VALUE ALERT  Critical value received:  ABG - CO2 95.3  Date of notification:  01/04/2015  Time of notification:  0332  Critical value read back:Yes.    Nurse who received alert:  Dianna Rossetti, RN  MD notified (1st page):  Pola Corn, Dr. Darrick Penna, MD  Time of first page:  [phone]  MD notified (2nd page):  Time of second page:  Responding MD:  Dr. Darrick Penna  Time MD responded:  [phone], no new orders received. Will continue to monitor.  Herma Ard, RN

## 2015-01-05 ENCOUNTER — Inpatient Hospital Stay (HOSPITAL_COMMUNITY): Payer: Medicaid Other

## 2015-01-05 DIAGNOSIS — J42 Unspecified chronic bronchitis: Secondary | ICD-10-CM

## 2015-01-05 LAB — BASIC METABOLIC PANEL
ANION GAP: 8 (ref 5–15)
BUN: 18 mg/dL (ref 6–20)
CO2: 43 mmol/L — ABNORMAL HIGH (ref 22–32)
Calcium: 9 mg/dL (ref 8.9–10.3)
Chloride: 87 mmol/L — ABNORMAL LOW (ref 101–111)
Creatinine, Ser: 0.59 mg/dL (ref 0.44–1.00)
Glucose, Bld: 153 mg/dL — ABNORMAL HIGH (ref 65–99)
POTASSIUM: 4.6 mmol/L (ref 3.5–5.1)
SODIUM: 138 mmol/L (ref 135–145)

## 2015-01-05 MED ORDER — LORATADINE 10 MG PO TABS
10.0000 mg | ORAL_TABLET | Freq: Every day | ORAL | Status: DC
  Administered 2015-01-05 – 2015-01-09 (×5): 10 mg via ORAL
  Filled 2015-01-05 (×5): qty 1

## 2015-01-05 NOTE — Progress Notes (Signed)
Utilization Review Completed.Brigitta Pricer T10/05/2014  

## 2015-01-05 NOTE — Progress Notes (Signed)
PULMONARY / CRITICAL CARE MEDICINE   Name: [name] MRN: [id number] DOB: 1950/08/12    ADMISSION DATE:  01/03/2015   REFERRING MD : EDP  CHIEF COMPLAINT:  Esp failure acute on chronic  INITIAL PRESENTATION: AMS  STUDIES:    SIGNIFICANT EVENTS: 9/30 DNR   HISTORY OF PRESENT ILLNESS:    [name] with a plethora of health issues that include but limited to, OSA,OHS,Chroinc resp failure, O2 dependent, hx of intubation with difficulty liberating from vent, MO, CHF, bilat fx ankles that have made her bed bound, general FTT. She presents with acute hypercarbic resp failure, purulent sputum x 1 week. She initially had PH 7.2 with PCo2 130 and was started on bipap. She is now awake and does not want to be intubated ever again and was made a DNR per her request. WSe will admit to SDU on bipap, tx underlying infection, diureses as tolerated and use NIMVS to bridge her through AECOPD episode. Note she has been followed by Dr. Shelle Iron in the past.   SUBJECTIVE: Still feels a little tight. Can't take satisfying deep breath.  VITAL SIGNS: Temp:  [97.8 F (36.6 C)-98.9 F (37.2 C)] 98.3 F (36.8 C) (10/02 0746) Pulse Rate:  [77-106] 77 (10/02 0457) Resp:  [15-22] 20 (10/02 0457) BP: (104-127)/(54-70) 124/62 mmHg (10/02 0457) SpO2:  [93 %-100 %] 98 % (10/02 0658) FiO2 (%):  [30 %] 30 % (10/02 0457) Weight:  [107.4 kg (236 lb 12.4 oz)] 107.4 kg (236 lb 12.4 oz) (10/02 0457) HEMODYNAMICS:   VENTILATOR SETTINGS: Vent Mode:  [-] BIPAP FiO2 (%):  [30 %] 30 % Set Rate:  [15 bmp] 15 bmp INTAKE / OUTPUT:  Intake/Output Summary (Last 24 hours) at 01/05/15 0959 Last data filed at 01/05/15 0510  Gross per 24 hour  Intake   1330 ml  Output      0 ml  Net   1330 ml    PHYSICAL EXAMINATION: General:  MOAAF lucid and interactive on [place] [facility]:Follows commands, clearly does not want intubation and cpr HEENT:  clear speech, no stridor Cardiovascular:  RRR, no mgr Lungs:  Distant, equal  bilat, no wheeze Abdomen: obese, + bs Musculoskeletal:  Lower ext with contractures Skin:  warm and dry  LABS:  CBC  Recent Labs Lab 01/03/15 0715 01/03/15 1413 01/04/15 0441  WBC 12.7* 14.5* 12.0*  HGB 10.6* 11.4* 10.8*  HCT 39.6 41.7 38.7  PLT 267 244 252   Coag's  Recent Labs Lab 01/03/15 1413  APTT 32  INR 1.04   BMET  Recent Labs Lab 01/03/15 0715 01/03/15 1413 01/05/15 0235  NA 144  --  138  K 4.4  --  4.6  CL 91*  --  87*  CO2 49*  --  43*  BUN 10  --  18  CREATININE 0.53 0.67 0.59  GLUCOSE 166*  --  153*   Electrolytes  Recent Labs Lab 01/03/15 0715 01/03/15 1413 01/05/15 0235  CALCIUM 9.1  --  9.0  MG  --  1.9  --   PHOS  --  3.7  --    Sepsis Markers  Recent Labs Lab 01/03/15 1413  LATICACIDVEN 5.5*  PROCALCITON 0.19   ABG  Recent Labs Lab 01/03/15 1015 01/03/15 1429 01/04/15 0315  PHART 7.167* 7.309* 7.323*  PCO2ART 135* 96.8* 95.3*  PO2ART 95.2 99.0 128*   Liver Enzymes No results for input(s): AST, ALT, ALKPHOS, BILITOT, ALBUMIN in the last 168 hours. Cardiac Enzymes  Recent Labs  Lab 01/03/15 0715  TROPONINI <0.03   Glucose  Recent Labs Lab 01/04/15 1209  GLUCAP 139*    Imaging Dg Chest Port 1 View  01/05/2015   CLINICAL DATA:  Acute respiratory failure Hx of asthma, CHF, HTN, COPD  EXAM: PORTABLE CHEST - 1 VIEW  COMPARISON:  01/03/2015  FINDINGS: Lungs clear. Heart size upper limits normal for technique. Patient is rotated toward the right. No pneumothorax. No effusion. Visualized skeletal structures are unremarkable.  IMPRESSION: No acute cardiopulmonary disease.   Electronically Signed   By: Corlis Leak M.D.   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  Sats improved now on 30% Doubt pna- no pneumonia evident on cxr. Possible aspiration P:   NIMVS as a bridge BD's, xopenex  Due to PAF Steroids Abx to cover aspiration DC Unasyn  abx tomorrow if stable Allow diet Reduce FiO@ to reduce CO2 retention  CARDIOVASCULAR CVL A:  CHF PAF HTN P:  Lasiv IV x 1(cxr looks wet) Daily weights   RENAL A:   No acute issue P:   Follow creatine  GASTROINTESTINAL A:   MO GI protection  P:   PPI Swallow eval  HEMATOLOGIC A:    No acute issue P:    INFECTIOUS A:   Presumed bronchitis  DC abx 10/3 if stable? P:   BCx2 9/30>> UC  9/30>> Sputum9/30 >> Abx:  9/30 Unasyn(may be aspirating)>>  ENDOCRINE CBG (last 3)   Recent Labs  01/04/15 1209  GLUCAP 139*     A:   No acute issue P:   Follow cbg while on steroids  NEUROLOGIC A:   Currently awake and lucid P:   RASS goal: 1 Hold all sedatives NIMVS to lower Pco2   FAMILY  - Updates: Note her caregiver is mentally challenged. She makes her own decisions.  - Inter-disciplinary family meet or Palliative Care meeting due by:  day 7    TODAY'S SUMMARY: [name] with a plethora of health issues that include but limited to, OSA,OHS,Chroinc resp failure, O2 dependent, hx of intubation with difficulty liberating from vent, MO, CHF, bilat fx ankles that have made her bed bound, general FTT. She presents with acute hypercarbic resp failure, purulent sputum x 1 week. She initially had PH 7.2 with PCo2 130 and was started on bipap. She is now awake and does not want to be intubated ever again and was made a DNR per her request. WSe will admit to SDU on bipap, tx underlying infection, diureses as tolerated and use NIMVS to bridge her through AECOPD episode. 10/1- Somnolent on BIPAP 60% with sat 100%. Quite lucid and chatty off BIPAP. CXR w/op pneumonia. To allow meals. 10/2- Improved on nasal prongs, still feels tight.  CD Maple Hudson, MD [facility] Pager [phone] till 3 pm If no answer page [phone] 01/05/2015, 9:59 AM

## 2015-01-06 DIAGNOSIS — R197 Diarrhea, unspecified: Secondary | ICD-10-CM

## 2015-01-06 LAB — C DIFFICILE QUICK SCREEN W PCR REFLEX
C DIFFICILE (CDIFF) INTERP: NEGATIVE
C DIFFICILE (CDIFF) TOXIN: NEGATIVE
C DIFFICLE (CDIFF) ANTIGEN: NEGATIVE

## 2015-01-06 MED ORDER — PANTOPRAZOLE SODIUM 40 MG PO TBEC
40.0000 mg | DELAYED_RELEASE_TABLET | Freq: Every day | ORAL | Status: DC
  Administered 2015-01-06 – 2015-01-08 (×3): 40 mg via ORAL
  Filled 2015-01-06 (×2): qty 1

## 2015-01-06 MED ORDER — PREDNISONE 20 MG PO TABS
40.0000 mg | ORAL_TABLET | Freq: Every day | ORAL | Status: DC
  Administered 2015-01-07 – 2015-01-09 (×3): 40 mg via ORAL
  Filled 2015-01-06 (×4): qty 2

## 2015-01-06 NOTE — Care Management Note (Signed)
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 From home with son.   Action/Plan: Return to home when medically stable. CM to f/u with d/c needs.  Expected Discharge Date:                  Expected Discharge Plan:  Home w Home Health Services  In-House Referral:     Discharge planning Services  CM Consult  Post Acute Care Choice:    Choice offered to:   Patient  DME Arranged:    DME Agency:     HH Arranged:  RN, Nurse Aide HH Agency:  [facility]  Status of Service:  In process, will continue to follow  Medicare Important Message Given:    Date Medicare IM Given:    Medicare IM give by:    Date Additional Medicare IM Given:    Additional Medicare Important Message give by:     If discussed at Long Length of Stay Meetings, dates discussed:    Additional Comments: Prior to admission active with [facility] services(RN,NA).    Teressa Lower (Sister)[phone], Stephanye [place] [facility]) [phone]  Gae Gallop [place], [place] [phone] 01/06/2015, 8:47 AM

## 2015-01-06 NOTE — Progress Notes (Signed)
PULMONARY / CRITICAL CARE MEDICINE   Name: [name] MRN: [id number] DOB: 11/22/1950    ADMISSION DATE:  01/03/2015   REFERRING MD : EDP  CHIEF COMPLAINT:  Esp failure acute on chronic  INITIAL PRESENTATION: AMS  STUDIES:   SIGNIFICANT EVENTS: 9/30 DNR  HISTORY OF PRESENT ILLNESS:   [name] with a plethora of health issues that include but limited to, OSA,OHS,Chroinc resp failure, O2 dependent, hx of intubation with difficulty liberating from vent, MO, CHF, bilat fx ankles that have made her bed bound, general FTT. She presents with acute hypercarbic resp failure, purulent sputum x 1 week. She initially had PH 7.2 with PCo2 130 and was started on bipap. She is now awake and does not want to be intubated ever again and was made a DNR per her request. WSe will admit to SDU on bipap, tx underlying infection, diureses as tolerated and use NIMVS to bridge her through AECOPD episode. Note she has been followed by Dr. Shelle Iron in the past.  SUBJECTIVE: Patient reports breathing has markedly improved since admission. Denies any significant cough. She denies any abdominal pain or nausea but has experienced over 3 watery stools in the last 24 hours. Patient has been on antibiotics for potential aspiration but passed her bedside swallow evaluation.  REVIEW OF SYSTEMS: Denies any chest pain, tightness, or palpitations. No subjective fever or chills. Does report a mild sweat.  VITAL SIGNS: Temp:  [97.6 F (36.4 C)-98.8 F (37.1 C)] 98.6 F (37 C) (10/03 1454) Pulse Rate:  [78-93] 93 (10/03 1605) Resp:  [16-24] 20 (10/03 1605) BP: (119-129)/(62-97) 119/97 mmHg (10/03 1605) SpO2:  [95 %-100 %] 95 % (10/03 1605) FiO2 (%):  [3 %-30 %] 3 % (10/03 1605) Weight:  [236 lb 8.9 oz (107.3 kg)] 236 lb 8.9 oz (107.3 kg) (10/03 0500) HEMODYNAMICS:   VENTILATOR SETTINGS: Vent Mode:  [-] BIPAP FiO2 (%):  [3 %-30 %] 3 % Set Rate:  [15 bmp] 15 bmp INTAKE / OUTPUT:  Intake/Output Summary (Last 24  hours) at 01/06/15 1607 Last data filed at 01/06/15 1500  Gross per 24 hour  Intake   1180 ml  Output      0 ml  Net   1180 ml    PHYSICAL EXAMINATION: General:  Awake. Alert. No acute distress. Sitting watching TV laying on her right side.  Integument:  Warm & dry. No rash on exposed skin. No bruising. HEENT:  Moist mucus membranes. No oral ulcers. No scleral injection or icterus. PERRL. Cardiovascular:  Regular rate. No edema. No appreciable JVD.  Pulmonary:  Symmetrically distant and decreased breath sounds. Symmetric chest wall expansion. No accessory muscle use on nasal cannula oxygen. Abdomen: Soft. Normal bowel sounds. Protuberant. Grossly nontender. Musculoskeletal:  Normal bulk and tone. Hand grip strength 5/5 bilaterally. No joint deformity or effusion appreciated.  LABS:  CBC  Recent Labs Lab 01/03/15 0715 01/03/15 1413 01/04/15 0441  WBC 12.7* 14.5* 12.0*  HGB 10.6* 11.4* 10.8*  HCT 39.6 41.7 38.7  PLT 267 244 252   Coag's  Recent Labs Lab 01/03/15 1413  APTT 32  INR 1.04   BMET  Recent Labs Lab 01/03/15 0715 01/03/15 1413 01/05/15 0235  NA 144  --  138  K 4.4  --  4.6  CL 91*  --  87*  CO2 49*  --  43*  BUN 10  --  18  CREATININE 0.53 0.67 0.59  GLUCOSE 166*  --  153*   Electrolytes  Recent Labs Lab 01/03/15 0715 01/03/15 1413 01/05/15 0235  CALCIUM 9.1  --  9.0  MG  --  1.9  --   PHOS  --  3.7  --    Sepsis Markers  Recent Labs Lab 01/03/15 1413  LATICACIDVEN 5.5*  PROCALCITON 0.19   ABG  Recent Labs Lab 01/03/15 1015 01/03/15 1429 01/04/15 0315  PHART 7.167* 7.309* 7.323*  PCO2ART 135* 96.8* 95.3*  PO2ART 95.2 99.0 128*   Liver Enzymes No results for input(s): AST, ALT, ALKPHOS, BILITOT, ALBUMIN in the last 168 hours. Cardiac Enzymes  Recent Labs Lab 01/03/15 0715  TROPONINI <0.03   Glucose  Recent Labs Lab 01/04/15 1209  GLUCAP 139*    Imaging No results found.  MICROBIOLOGY: BCx2  9/30>>  ANTIBIOTICS: Unasyn 9/30-10/3  ASSESSMENT / PLAN: 64 year old female admitted for acute on chronic hypercapnic respiratory failure. Patient has had some improvement in respiratory status with gentle Lasix diuresis yesterday. She is reporting some frequent watery diarrheas which does raise the possibility of underlying C. difficile colitis. The patient is clinically improving but has yet ambulated out of bed. I did speak with nursing staff who are caring for her today and they report she is guarded regarding her previous fractures. The patient is continuing to use her nocturnal BiPAP.  1. Acute on chronic hypercarbic respiratory failure: Improving. Continuing nocturnal BiPAP. Continuing scheduled Atrovent & Xopenex nebs every 6 hours. Decreasing dose of steroids to prednisone 40 mg by mouth daily starting tomorrow morning. Limiting sedating medications. 2. Acute on chronic hypoxic respiratory failure: Plan to repeat electrolyte panel & urine function studies in the morning. Consider repeat dose of Lasix tomorrow. Continuing to wean FiO2 for saturation 88-92 % to minimize VQ mismatching and hyperoxygenation. 3. Watery diarrhea: Checking stool C. difficile. Patient being placed on enteric precautions. 4. Possible aspiration pneumonia versus pneumonitis: Passed bedside swallow evaluation. DC Unasyn.  5. Diet: Passed swallow evaluation. Continuing heart healthy carb modified diet. 6. Prophylaxis: Continuing heparin subcutaneous every 8 hours. Switching Protonix to by mouth daily at bedtime. 7. Disposition: Consult and PT/OT for evaluation and discharge recommendations.  Donna Christen Jamison Neighbor, M.D. [facility] Pager:  [phone] After 3pm or if no response, call [phone] 01/06/2015, 4:07 PM

## 2015-01-07 LAB — RENAL FUNCTION PANEL
Albumin: 2.7 g/dL — ABNORMAL LOW (ref 3.5–5.0)
Anion gap: 5 (ref 5–15)
BUN: 18 mg/dL (ref 6–20)
CHLORIDE: 92 mmol/L — AB (ref 101–111)
CO2: 42 mmol/L — ABNORMAL HIGH (ref 22–32)
CREATININE: 0.41 mg/dL — AB (ref 0.44–1.00)
Calcium: 8.7 mg/dL — ABNORMAL LOW (ref 8.9–10.3)
GFR calc Af Amer: >60 mL/min (ref >60)
Glucose, Bld: 90 mg/dL (ref 65–99)
Phosphorus: 3.5 mg/dL (ref 2.5–4.6)
Potassium: 4.4 mmol/L (ref 3.5–5.1)
Sodium: 139 mmol/L (ref 135–145)

## 2015-01-07 LAB — CBC WITH DIFFERENTIAL/PLATELET
BASOS ABS: 0 10*3/uL (ref 0.0–0.1)
Basophils Relative: 0 %
EOS ABS: 0 10*3/uL (ref 0.0–0.7)
EOS PCT: 0 %
HCT: 35.4 % — ABNORMAL LOW (ref 36.0–46.0)
Hemoglobin: 10.4 g/dL — ABNORMAL LOW (ref 12.0–15.0)
LYMPHS ABS: 3.6 10*3/uL (ref 0.7–4.0)
Lymphocytes Relative: 29 %
MCH: 29.7 pg (ref 26.0–34.0)
MCHC: 29.4 g/dL — ABNORMAL LOW (ref 30.0–36.0)
MCV: 101.1 fL — ABNORMAL HIGH (ref 78.0–100.0)
Monocytes Absolute: 1.3 10*3/uL — ABNORMAL HIGH (ref 0.1–1.0)
Monocytes Relative: 11 %
Neutro Abs: 7.7 10*3/uL (ref 1.7–7.7)
Neutrophils Relative %: 60 %
PLATELETS: 251 10*3/uL (ref 150–400)
RBC: 3.5 MIL/uL — AB (ref 3.87–5.11)
RDW: 14.3 % (ref 11.5–15.5)
WBC: 12.6 10*3/uL — AB (ref 4.0–10.5)

## 2015-01-07 LAB — MAGNESIUM: MAGNESIUM: 2 mg/dL (ref 1.7–2.4)

## 2015-01-07 MED ORDER — FUROSEMIDE 20 MG PO TABS
20.0000 mg | ORAL_TABLET | Freq: Once | ORAL | Status: AC
  Administered 2015-01-07: 20 mg via ORAL
  Filled 2015-01-07: qty 1

## 2015-01-07 NOTE — Evaluation (Signed)
Physical Therapy Evaluation Patient Details Name: [name] MRN: [id number] DOB: 1950/09/03 Today's Date: 01/07/2015   History of Present Illness  Pt admitted with acute on chronic respiratory failure.  PMH: OSA, OHA, chronic respiratory failure, 02 dependence, CHF, FTT, B ankle fxs.  Clinical Impression  Pt admitted with/for respiratory failure.  Pt currently limited functionally due to the problems listed below.  (see problems list.)  Pt will benefit from PT to maximize function and safety to be able to get home safely with available assist of family and outside caregivers.     Follow Up Recommendations Home health PT;Supervision/Assistance - 24 hour    Equipment Recommendations  None recommended by PT    Recommendations for Other Services       Precautions / Restrictions Precautions Precautions: Fall Restrictions Weight Bearing Restrictions: No Other Position/Activity Restrictions: Pt reports she stands only in some "boots the dr gave her"      Mobility  Bed Mobility Overal bed mobility: Needs Assistance Bed Mobility: Rolling;Sidelying to Sit;Sit to Supine Rolling: Min assist Sidelying to sit: Min assist   Sit to supine: Min assist   General bed mobility comments: moderate use of rail, but minimal asssit  Transfers Overall transfer level: Needs assistance               General transfer comment: used maximove for transfer chair to bed  Ambulation/Gait                Stairs            Wheelchair Mobility    Modified Rankin (Stroke Patients Only)       Balance Overall balance assessment: Needs assistance Sitting-balance support: No upper extremity supported Sitting balance-Leahy Scale: Fair Sitting balance - Comments: pt could kick and complete MMT at EOB without holding to rail                                     Pertinent Vitals/Pain Pain Assessment: Faces Faces Pain Scale: No hurt Pain Location: head Pain  Descriptors / Indicators: Aching Pain Intervention(s): Patient requesting pain meds-RN notified    Home Living Family/patient expects to be discharged to:: Private residence Living Arrangements: Children (son) Available Help at Discharge: Family;Available 24 hours/day;Personal care attendant (CNA x 2 per week through home health agency) Type of Home: House Home Access: Ramped entrance     Home Layout: One level Home Equipment: Walker - 2 wheels;Bedside commode;Wheelchair - manual;Hospital bed      Prior Function Level of Independence: Needs assistance   Gait / Transfers Assistance Needed: pt does not ambulate, stands momentarily with PT only, bedbound  ADL's / Homemaking Assistance Needed: Son assists pt with use of bedpan and with pericare. She bathes and changes her nightgown with set up long sitting in the bed.  Son provides all meals and housekeeping. Pt had a CNA 2x per week from her [facility] agency who assisted with bathing and linen changes.        Hand Dominance   Dominant Hand: Right    Extremity/Trunk Assessment   Upper Extremity Assessment: Overall WFL for tasks assessed           Lower Extremity Assessment: Generalized weakness (3/5 gross ext, 2+ gross flexion  3/5 hip flexion all bil.)         Communication   Communication: No difficulties  Cognition Arousal/Alertness: Awake/alert Behavior During Therapy: WFL for  tasks assessed/performed Overall Cognitive Status: Within Functional Limits for tasks assessed                      General Comments General comments (skin integrity, edema, etc.): Pt has boots to wear for standing which are not present in the hospital.    Exercises        Assessment/Plan    PT Assessment Patient needs continued PT services  PT Diagnosis Generalized weakness   PT Problem List Decreased strength;Decreased activity tolerance;Decreased mobility;Cardiopulmonary status limiting activity  PT Treatment Interventions  Functional mobility training;Therapeutic activities;Therapeutic exercise;Patient/family education   PT Goals (Current goals can be found in the Care Plan section) Acute Rehab PT Goals Patient Stated Goal: return home with son with more assistance  PT Goal Formulation: With patient Time For Goal Achievement: 01/14/15 Potential to Achieve Goals: Fair    Frequency Min 2X/week   Barriers to discharge        Co-evaluation               End of Session   Activity Tolerance: Patient tolerated treatment well Patient left: in bed;with call bell/phone within reach Nurse Communication: Mobility status         Time: 5621-3086 PT Time Calculation (min) (ACUTE ONLY): 21 min   Charges:   PT Evaluation $Initial PT Evaluation Tier I: 1 Procedure     PT G Codes:        Yiannis Tulloch, Eliseo Gum 01/07/2015, 5:36 PM 01/07/2015  Walker Bing, PT [phone]  (pager)

## 2015-01-07 NOTE — Evaluation (Signed)
Occupational Therapy Evaluation Patient Details Name: [name] MRN: [id number] DOB: 11-Dec-1950 Today's Date: 01/07/2015    History of Present Illness Pt admitted with acute on chronic respiratory failure.  PMH: OSA, OHA, chronic respiratory failure, 02 dependence, CHF, FTT, B ankle fxs.   Clinical Impression   Pt has been bed bound for several months. Stands only with her HHPT.  Pt has had extensive home PT, OT and aide services. Pt reports her son assisting her with bed pan use, setting her up for sponge bathing an dressing and all meal prep and housekeeping. Pt is performing at her baseline.  Could benefit from SW to assist with transportation and more assistance in the home.  No acute OT needs.      Follow Up Recommendations  Supervision/Assistance - 24 hour (Resume home health services from prior to admission.)    Equipment Recommendations  None recommended by OT    Recommendations for Other Services       Precautions / Restrictions Precautions Precautions: Fall Restrictions Weight Bearing Restrictions: No Other Position/Activity Restrictions: Pt reports she stands only in some "boots the dr gave her"      Mobility Bed Mobility Overal bed mobility: Needs Assistance Bed Mobility: Rolling Rolling: Min assist         General bed mobility comments: min assist in reclined chair to roll, pt able to sit straight up from flat bed into long sitting.  Transfers Overall transfer level: Needs assistance               General transfer comment: used maximove for transfer chair to bed    Balance                                            ADL Overall ADL's : Needs assistance/impaired Eating/Feeding: Set up;Sitting   Grooming: Wash/dry hands;Wash/dry face;Oral care;Sitting;Set up   Upper Body Bathing: Set up;Sitting   Lower Body Bathing: Maximal assistance;Sitting/lateral leans (rolling side to side)   Upper Body Dressing : Set  up;Sitting   Lower Body Dressing: Maximal assistance (for socks)     Toilet Transfer Details (indicate cue type and reason): pt used bed pan rolling in reclined chair Toileting- Clothing Manipulation and Hygiene: Total assistance (rolling)       Functional mobility during ADLs:  (non ambulatory)       Vision     Perception     Praxis      Pertinent Vitals/Pain Pain Assessment: Faces Faces Pain Scale: Hurts little more Pain Location: head Pain Descriptors / Indicators: Aching Pain Intervention(s): Patient requesting pain meds-RN notified     Hand Dominance Right   Extremity/Trunk Assessment Upper Extremity Assessment Upper Extremity Assessment: Overall WFL for tasks assessed   Lower Extremity Assessment Lower Extremity Assessment: Defer to PT evaluation       Communication Communication Communication: No difficulties   Cognition Arousal/Alertness: Awake/alert Behavior During Therapy: WFL for tasks assessed/performed Overall Cognitive Status: Within Functional Limits for tasks assessed                     General Comments       Exercises       Shoulder Instructions      Home Living Family/patient expects to be discharged to:: Private residence Living Arrangements: Children (son) Available Help at Discharge: Family;Available 24 hours/day;Personal care attendant (CNA x 2  per week through home health agency) Type of Home: House Home Access: Ramped entrance     Home Layout: One level               Home Equipment: Walker - 2 wheels;Bedside commode;Wheelchair - manual;Hospital bed          Prior Functioning/Environment Level of Independence: Needs assistance  Gait / Transfers Assistance Needed: pt does not ambulate, stands momentarily with PT only, bedbound ADL's / Homemaking Assistance Needed: Son assists pt with use of bedpan and with pericare. She bathes and changes her nightgown with set up long sitting in the bed.  Son provides all  meals and housekeeping. Pt had a CNA 2x per week from her [facility] agency who assisted with bathing and linen changes.        OT Diagnosis: Generalized weakness;Acute pain   OT Problem List:     OT Treatment/Interventions:      OT Goals(Current goals can be found in the care plan section) Acute Rehab OT Goals Patient Stated Goal: return home with son with more assistance   OT Frequency:     Barriers to D/C:            Co-evaluation              End of Session Nurse Communication: Patient requests pain meds (home situation)  Activity Tolerance: Patient limited by fatigue Patient left: in bed;with call bell/phone within reach;with nursing/sitter in room   Time: 1359-1430 OT Time Calculation (min): 31 min Charges:  OT General Charges $OT Visit: 1 Procedure OT Evaluation $Initial OT Evaluation Tier I: 1 Procedure OT Treatments $Self Care/Home Management : 8-22 mins G-Codes:    Evern Bio 01/07/2015, 3:02 PM  [phone]

## 2015-01-07 NOTE — Progress Notes (Signed)
PULMONARY / CRITICAL CARE MEDICINE   Name: [name] MRN: [id number] DOB: [birth year]    ADMISSION DATE:  01/03/2015   REFERRING MD : EDP  CHIEF COMPLAINT:  Esp failure acute on chronic  INITIAL PRESENTATION: AMS  STUDIES:   SIGNIFICANT EVENTS: 9/30 DNR  HISTORY OF PRESENT ILLNESS:   [name] with a plethora of health issues that include but limited to, OSA,OHS,Chroinc resp failure, O2 dependent, hx of intubation with difficulty liberating from vent, MO, CHF, bilat fx ankles that have made her bed bound, general FTT. She presents with acute hypercarbic resp failure, purulent sputum x 1 week. She initially had PH 7.2 with PCo2 130 and was started on bipap. She is now awake and does not want to be intubated ever again and was made a DNR per her request. WSe will admit to SDU on bipap, tx underlying infection, diureses as tolerated and use NIMVS to bridge her through AECOPD episode. Note she has been followed by Dr. Shelle Iron in the past.  SUBJECTIVE: No acute events overnight. Baseline dyspnea unchanged. Minimal coughing. Didn't sleep with BiPAP last night but tubing became dislodged multiple times. Continuing to have watery diarrhea but denies any abdominal pain or nausea.  REVIEW OF SYSTEMS: No subjective fever, chills, or sweats. No chest pain or pressure.  VITAL SIGNS: Temp:  [97.6 F (36.4 C)-98.8 F (37.1 C)] 98.3 F (36.8 C) (10/04 0730) Pulse Rate:  [77-93] 84 (10/04 0315) Resp:  [16-26] 23 (10/04 0315) BP: (109-139)/(59-97) 109/89 mmHg (10/04 0730) SpO2:  [95 %-100 %] 99 % (10/04 0745) FiO2 (%):  [3 %-30 %] 30 % (10/04 0315) Weight:  [236 lb 15.9 oz (107.5 kg)] 236 lb 15.9 oz (107.5 kg) (10/04 0500) HEMODYNAMICS:   VENTILATOR SETTINGS: Vent Mode:  [-] BIPAP FiO2 (%):  [3 %-30 %] 30 % Set Rate:  [15 bmp] 15 bmp INTAKE / OUTPUT:  Intake/Output Summary (Last 24 hours) at 01/07/15 0816 Last data filed at 01/06/15 1800  Gross per 24 hour  Intake    720 ml  Output       0 ml  Net    720 ml    PHYSICAL EXAMINATION: General: morbidly obese. No distress. Sitting watching TV laying on her right side.  Integument:  Warm & dry. No rash on exposed skin. HEENT:  Moist mucus membranes. No oral ulcers. PERRL. Cardiovascular:  Regular rate. No edema. No appreciable JVD.  Pulmonary:  Symmetrically distant and decreased breath sounds. Symmetric chest wall expansion. No accessory muscle use on nasal cannula oxygen. Abdomen: Soft. Protuberant. Grossly nontender. Musculoskeletal:  Normal bulk and tone. Hand grip strength 5/5 bilaterally. No joint deformity or effusion appreciated.  LABS:  CBC  Recent Labs Lab 01/03/15 1413 01/04/15 0441 01/07/15 0425  WBC 14.5* 12.0* 12.6*  HGB 11.4* 10.8* 10.4*  HCT 41.7 38.7 35.4*  PLT 244 252 251   Coag's  Recent Labs Lab 01/03/15 1413  APTT 32  INR 1.04   BMET  Recent Labs Lab 01/03/15 0715 01/03/15 1413 01/05/15 0235 01/07/15 0425  NA 144  --  138 139  K 4.4  --  4.6 4.4  CL 91*  --  87* 92*  CO2 49*  --  43* 42*  BUN 10  --  18 18  CREATININE 0.53 0.67 0.59 0.41*  GLUCOSE 166*  --  153* 90   Electrolytes  Recent Labs Lab 01/03/15 0715 01/03/15 1413 01/05/15 0235 01/07/15 0425  CALCIUM 9.1  --  9.0 8.7*  MG  --  1.9  --  2.0  PHOS  --  3.7  --  3.5   Sepsis Markers  Recent Labs Lab 01/03/15 1413  LATICACIDVEN 5.5*  PROCALCITON 0.19   ABG  Recent Labs Lab 01/03/15 1015 01/03/15 1429 01/04/15 0315  PHART 7.167* 7.309* 7.323*  PCO2ART 135* 96.8* 95.3*  PO2ART 95.2 99.0 128*   Liver Enzymes  Recent Labs Lab 01/07/15 0425  ALBUMIN 2.7*   Cardiac Enzymes  Recent Labs Lab 01/03/15 0715  TROPONINI <0.03   Glucose  Recent Labs Lab 01/04/15 1209  GLUCAP 139*    Imaging No results found.  MICROBIOLOGY: BCx2 9/30>> C diff 10/3 - negative  ANTIBIOTICS: Unasyn 9/30-10/3  ASSESSMENT / PLAN: 64 year old female admitted for acute on chronic hypercapnic  respiratory failure. Respiratory status is unchanged. Patient reports she has not ambulated since last summer after her ankle fracture. She reports previously she was only able to walk 5 feet and was utilizing a wheelchair at home to help with day-to-day living. I suspect that her diarrhea is likely functional and not related to any infectious etiology. Clinically the patient continues to improve and I am proceeding with gentle diuresis with oral Lasix. I did discuss potential disposition options with the patient including nursing facility versus home. The patient does not wish to return to a skilled nursing facility.   1. Acute on chronic hypercarbic respiratory failure: Improving. Continuing nocturnal BiPAP. Continuing scheduled Atrovent & Xopenex nebs every 6 hours. limiting sedating medications. Continuing prednisone 40 mg by mouth daily.  2. Acute on chronic hypoxic respiratory failure: Continuing to wean FiO2 for saturation 88-92 % to minimize VQ mismatching and hyperoxygenation. Lasix 20 mg by mouth 1 today. Repeat electrolyte panel in the morning.  3. Watery diarrhea: Stool C. difficile negative. Unasyn discontinued yesterday. 4. Possible aspiration pneumonia versus pneumonino evidence of aspiration on swallow evaluation. Treated with Unasyn for 4 days.  5. Diet: Passed swallow evaluation. Continuing heart healthy carb modified diet. 6. Prophylaxis: Continuing heparin subcutaneous every 8 hours. Protonix to by mouth daily at bedtime. 7. Disposition: Consulted PT/OT for evaluation and discharge recommendations.Pending disposition based on PT/OT recommendations.  Donna Christen Jamison Neighbor, M.D. [facility] Pulmonary & Critical Care Pager:  [phone] After 3pm or if no response, call [phone] 01/07/2015, 8:16 AM

## 2015-01-08 DIAGNOSIS — I5022 Chronic systolic (congestive) heart failure: Secondary | ICD-10-CM

## 2015-01-08 LAB — RENAL FUNCTION PANEL
ANION GAP: 5 (ref 5–15)
Albumin: 2.7 g/dL — ABNORMAL LOW (ref 3.5–5.0)
BUN: 14 mg/dL (ref 6–20)
CO2: 41 mmol/L — AB (ref 22–32)
Calcium: 8.7 mg/dL — ABNORMAL LOW (ref 8.9–10.3)
Chloride: 91 mmol/L — ABNORMAL LOW (ref 101–111)
Creatinine, Ser: 0.47 mg/dL (ref 0.44–1.00)
GFR calc non Af Amer: >60 mL/min (ref >60)
GLUCOSE: 95 mg/dL (ref 65–99)
PHOSPHORUS: 3.6 mg/dL (ref 2.5–4.6)
POTASSIUM: 4.1 mmol/L (ref 3.5–5.1)
Sodium: 137 mmol/L (ref 135–145)

## 2015-01-08 LAB — CULTURE, BLOOD (ROUTINE X 2)
CULTURE: NO GROWTH
CULTURE: NO GROWTH

## 2015-01-08 MED ORDER — IPRATROPIUM BROMIDE 0.02 % IN SOLN
0.5000 mg | Freq: Four times a day (QID) | RESPIRATORY_TRACT | Status: DC
  Administered 2015-01-08 – 2015-01-09 (×5): 0.5 mg via RESPIRATORY_TRACT
  Filled 2015-01-08 (×5): qty 2.5

## 2015-01-08 MED ORDER — PRAVASTATIN SODIUM 20 MG PO TABS
20.0000 mg | ORAL_TABLET | Freq: Every day | ORAL | Status: DC
  Administered 2015-01-08: 20 mg via ORAL
  Filled 2015-01-08 (×2): qty 1

## 2015-01-08 MED ORDER — FUROSEMIDE 20 MG PO TABS
20.0000 mg | ORAL_TABLET | Freq: Every day | ORAL | Status: DC
  Filled 2015-01-08: qty 1

## 2015-01-08 MED ORDER — CARVEDILOL 3.125 MG PO TABS
3.1250 mg | ORAL_TABLET | Freq: Two times a day (BID) | ORAL | Status: DC
  Administered 2015-01-08 – 2015-01-09 (×3): 3.125 mg via ORAL
  Filled 2015-01-08 (×5): qty 1

## 2015-01-08 MED ORDER — ASPIRIN EC 81 MG PO TBEC
81.0000 mg | DELAYED_RELEASE_TABLET | Freq: Every day | ORAL | Status: DC
  Administered 2015-01-08 – 2015-01-09 (×2): 81 mg via ORAL
  Filled 2015-01-08 (×2): qty 1

## 2015-01-08 MED ORDER — ARFORMOTEROL TARTRATE 15 MCG/2ML IN NEBU
15.0000 ug | INHALATION_SOLUTION | Freq: Two times a day (BID) | RESPIRATORY_TRACT | Status: DC
  Administered 2015-01-08 – 2015-01-09 (×3): 15 ug via RESPIRATORY_TRACT
  Filled 2015-01-08 (×5): qty 2

## 2015-01-08 MED ORDER — BUDESONIDE 0.5 MG/2ML IN SUSP
0.5000 mg | Freq: Two times a day (BID) | RESPIRATORY_TRACT | Status: DC
  Administered 2015-01-08 – 2015-01-09 (×3): 0.5 mg via RESPIRATORY_TRACT
  Filled 2015-01-08 (×5): qty 2

## 2015-01-08 MED ORDER — PANTOPRAZOLE SODIUM 40 MG PO TBEC: 40.0000 mg | DELAYED_RELEASE_TABLET | Freq: Every day | ORAL | Status: DC

## 2015-01-08 NOTE — Progress Notes (Signed)
UR COMPLETED  

## 2015-01-08 NOTE — Progress Notes (Signed)
PULMONARY / CRITICAL CARE MEDICINE   Name: [name] MRN: [id number] DOB: September 30, 1950    ADMISSION DATE:  01/03/2015   REFERRING MD : EDP  CHIEF COMPLAINT:  Esp failure acute on chronic  INITIAL PRESENTATION: AMS  STUDIES:   SIGNIFICANT EVENTS: 9/30 DNR  HISTORY OF PRESENT ILLNESS:   [name] with a plethora of health issues that include but limited to, OSA,OHS,Chroinc resp failure, O2 dependent, hx of intubation with difficulty liberating from vent, MO, CHF, bilat fx ankles that have made her bed bound, general FTT. She presents with acute hypercarbic resp failure, purulent sputum x 1 week. She initially had PH 7.2 with PCo2 130 and was started on bipap. She is now awake and does not want to be intubated ever again and was made a DNR per her request. WSe will admit to SDU on bipap, tx underlying infection, diureses as tolerated and use NIMVS to bridge her through AECOPD episode. Note she has been followed by Dr. Shelle Iron in the past.  SUBJECTIVE: Patient tolerated BiPAP overnight. Denies any chest pain, pressure, or palpitations. Reports she is unable to ambulate without the use of bilateral boots recommended by her orthopedist. Denies any significant cough. No dyspnea at rest.  REVIEW OF SYSTEMS: No subjective fever, chills, or sweats. No chest pain or pressure.  VITAL SIGNS: Temp:  [97.7 F (36.5 C)-98.6 F (37 C)] 98.2 F (36.8 C) (10/05 0740) Pulse Rate:  [71-92] 92 (10/05 0740) Resp:  [17-26] 20 (10/05 0740) BP: (105-155)/(49-89) 105/49 mmHg (10/05 0740) SpO2:  [94 %-100 %] 100 % (10/05 0926) FiO2 (%):  [30 %] 30 % (10/05 0243) Weight:  [235 lb 10.8 oz (106.9 kg)] 235 lb 10.8 oz (106.9 kg) (10/05 0429) HEMODYNAMICS:   VENTILATOR SETTINGS: Vent Mode:  [-] BIPAP FiO2 (%):  [30 %] 30 % Set Rate:  [15 bmp] 15 bmp INTAKE / OUTPUT:  Intake/Output Summary (Last 24 hours) at 01/08/15 1020 Last data filed at 01/07/15 2145  Gross per 24 hour  Intake      0 ml  Output     202 ml  Net   -202 ml    PHYSICAL EXAMINATION: General: Morbidly obese. No distress. Sitting watching TV laying on her right side.  Integument:  Warm & dry. No rash on exposed skin. HEENT:  Moist mucus membranes. No oral ulcers. PERRL. Cardiovascular:  Regular rate. Sinus rhythm on telemetry. No edema. No appreciable JVD.  Pulmonary:  Symmetrically distant and decreased breath sounds. Normal work of breathing on nasal cannula. Abdomen: Soft. Protuberant. Nontender. Musculoskeletal:  Normal bulk and tone. No joint deformity or effusion appreciated.  LABS:  CBC  Recent Labs Lab 01/03/15 1413 01/04/15 0441 01/07/15 0425  WBC 14.5* 12.0* 12.6*  HGB 11.4* 10.8* 10.4*  HCT 41.7 38.7 35.4*  PLT 244 252 251   Coag's  Recent Labs Lab 01/03/15 1413  APTT 32  INR 1.04   BMET  Recent Labs Lab 01/05/15 0235 01/07/15 0425 01/08/15 0315  NA 138 139 137  K 4.6 4.4 4.1  CL 87* 92* 91*  CO2 43* 42* 41*  BUN CREATININE 0.59 0.41* 0.47  GLUCOSE 153* 90 95   Electrolytes  Recent Labs Lab 01/03/15 1413 01/05/15 0235 01/07/15 0425 01/08/15 0315  CALCIUM  --  9.0 8.7* 8.7*  MG 1.9  --  2.0  --   PHOS 3.7  --  3.5 3.6   Sepsis Markers  Recent Labs Lab 01/03/15 1413  LATICACIDVEN 5.5*  PROCALCITON 0.19   ABG  Recent Labs Lab 01/03/15 1015 01/03/15 1429 01/04/15 0315  PHART 7.167* 7.309* 7.323*  PCO2ART 135* 96.8* 95.3*  PO2ART 95.2 99.0 128*   Liver Enzymes  Recent Labs Lab 01/07/15 0425 01/08/15 0315  ALBUMIN 2.7* 2.7*   Cardiac Enzymes  Recent Labs Lab 01/03/15 0715  TROPONINI <0.03   Glucose  Recent Labs Lab 01/04/15 1209  GLUCAP 139*    Imaging No results found.  MICROBIOLOGY: BCx2 9/30>> C diff 10/3 - negative  ANTIBIOTICS: Unasyn 9/30-10/3  ASSESSMENT / PLAN: 64 year old female admitted for acute on chronic hypercapnic respiratory failure. Respiratory status is unchanged. Patient reports she has not ambulated  since last summer after her ankle fracture. Clinically improving with regards to her acute respiratory failure. I am reinitiating the patient's home nebulizer regimen today. Currently she is in normal sinus rhythm. I am going to restart her low-dose beta blocker given her underlying systolic congestive heart failure. I'm holding on continuing daily diuretic given her marginal blood pressure today. I suspect the patient continues to remain stable we may be able to discharge her to home per her wishes the next 24 hours. I did speak with patient's nurse at bedside regarding her plan of care today.  1. Acute on chronic hypercarbic respiratory failure: Improving. Continuing nocturnal BiPAP. Continuing scheduled Atrovent qid. Restarting on Brovana & budesonide. Limiting sedating medications. Continuing prednisone 40 mg by mouth daily started on 10/4.  2. Acute on chronic hypoxic respiratory failure: Continuing to wean FiO2 for saturation 88-92 % to minimize VQ mismatching and hyperoxygenation. Holding on further diuresis today. Repeat electrolyte panel in the morning.  3. Watery diarrhea: Stool C. difficile negative. Unasyn discontinued 10/03. 4. Possible aspiration pneumonia versus pneumonino evidence of aspiration on swallow evaluation. Treated with Unasyn for 4 days.  5. Chronic systolic congestive heart failure: Restarting aspirin 81 mg by mouth daily, Coreg 3.125 mg by mouth twice a day, & Pravachol 20 mg by mouth daily at bedtime today. Holding on Lasix at this time given her marginal blood pressure. 6. Diet: Passed swallow evaluation. Continuing heart healthy carb modified diet. 7. Prophylaxis: Continuing heparin subcutaneous every 8 hours. Protonix to by mouth daily at bedtime. 8. Disposition: Patient will need home health and home physical therapy on discharge. If she continues to remain stable plan for discharge to home with ambulance service providing transport on 10/06. I discussed this with the  patient's nurse at bedside today.  Donna Christen Jamison Neighbor, M.D. [facility] Pulmonary & Critical Care Pager:  [phone] After 3pm or if no response, call [phone] 01/08/2015, 10:20 AM

## 2015-01-08 NOTE — Progress Notes (Signed)
Patient placed on BIPAP HS on 16/8 and 30%FIO2. Patient tolerating well sat 100%. RT will continue to monitor as needed.

## 2015-01-09 DIAGNOSIS — I5022 Chronic systolic (congestive) heart failure: Secondary | ICD-10-CM | POA: Insufficient documentation

## 2015-01-09 DIAGNOSIS — G4733 Obstructive sleep apnea (adult) (pediatric): Secondary | ICD-10-CM

## 2015-01-09 LAB — RENAL FUNCTION PANEL
ALBUMIN: 2.7 g/dL — AB (ref 3.5–5.0)
ANION GAP: 9 (ref 5–15)
BUN: 14 mg/dL (ref 6–20)
CALCIUM: 8.6 mg/dL — AB (ref 8.9–10.3)
CO2: 41 mmol/L — ABNORMAL HIGH (ref 22–32)
Chloride: 85 mmol/L — ABNORMAL LOW (ref 101–111)
Creatinine, Ser: 0.46 mg/dL (ref 0.44–1.00)
GFR calc Af Amer: >60 mL/min (ref >60)
GLUCOSE: 97 mg/dL (ref 65–99)
PHOSPHORUS: 3.3 mg/dL (ref 2.5–4.6)
POTASSIUM: 4 mmol/L (ref 3.5–5.1)
SODIUM: 135 mmol/L (ref 135–145)

## 2015-01-09 MED ORDER — ALBUTEROL SULFATE HFA 108 (90 BASE) MCG/ACT IN AERS: 2.0000 | INHALATION_SPRAY | Freq: Four times a day (QID) | RESPIRATORY_TRACT | Status: AC | PRN

## 2015-01-09 MED ORDER — BUDESONIDE 0.5 MG/2ML IN SUSP: 0.5000 mg | Freq: Two times a day (BID) | RESPIRATORY_TRACT | Status: AC

## 2015-01-09 MED ORDER — LORATADINE 10 MG PO TABS: 10.0000 mg | ORAL_TABLET | Freq: Every day | ORAL | Status: AC

## 2015-01-09 MED ORDER — PREDNISONE 10 MG PO TABS: ORAL_TABLET | ORAL | Status: DC

## 2015-01-09 NOTE — Progress Notes (Signed)
Pt placed on BIPAP at this time, resting comfortably. Will continue to monitor as needed

## 2015-01-09 NOTE — Progress Notes (Signed)
DC instructions given to patient. Explained importance of medication regimen as described by DC packet. [facility] to transport patient home. On 3LPM [place]-home dose, no c/o SOB. VSS. eICU and CCMT notified of DC. PIV DC, hemostasis achieved. All belongings to be sent home with patient at time of DC, including glasses and polka-dot dress. Will continue to monitor until time of discharge.

## 2015-01-09 NOTE — Progress Notes (Signed)
PULMONARY / CRITICAL CARE MEDICINE   Name: [name] MRN: [id number] DOB: Jul 12, 1950    ADMISSION DATE:  01/03/2015   REFERRING MD : EDP  CHIEF COMPLAINT:  Esp failure acute on chronic  INITIAL PRESENTATION: AMS  STUDIES:   SIGNIFICANT EVENTS: 9/30 DNR  HISTORY OF PRESENT ILLNESS:   [name] with a plethora of health issues that include but limited to, OSA,OHS,Chroinc resp failure, O2 dependent, hx of intubation with difficulty liberating from vent, MO, CHF, bilat fx ankles that have made her bed bound, general FTT. She presents with acute hypercarbic resp failure, purulent sputum x 1 week. She initially had PH 7.2 with PCo2 130 and was started on bipap. She is now awake and does not want to be intubated ever again and was made a DNR per her request. WSe will admit to SDU on bipap, tx underlying infection, diureses as tolerated and use NIMVS to bridge her through AECOPD episode. Note she has been followed by Dr. Shelle Iron in the past.  SUBJECTIVE: Continuing to tolerate nightly BiPAP. Denies any coughing. Does report some mild dyspnea this morning but is preparing to have a nebulizer treatment as scheduled. No wheezing. No subjective fever or chills.  REVIEW OF SYSTEMS: Patient reporting some headache this morning. Denies any chest pain or palpitations. Denies any nausea or vomiting.  VITAL SIGNS: Temp:  [97.3 F (36.3 C)-98.5 F (36.9 C)] 98.2 F (36.8 C) (10/06 0735) Pulse Rate:  [68-121] 85 (10/06 0735) Resp:  [11-25] 19 (10/06 0735) BP: (98-131)/(53-86) 114/53 mmHg (10/06 0735) SpO2:  [95 %-100 %] 100 % (10/06 0817) FiO2 (%):  [30 %] 30 % (10/06 0100) Weight:  [237 lb 10.5 oz (107.8 kg)] 237 lb 10.5 oz (107.8 kg) (10/06 0355) HEMODYNAMICS:   VENTILATOR SETTINGS: Vent Mode:  [-] PCV FiO2 (%):  [30 %] 30 % Set Rate:  [15 bmp] 15 bmp INTAKE / OUTPUT: No intake or output data in the 24 hours ending 01/09/15 0958  PHYSICAL EXAMINATION: General: Morbidly obese. Patient  again on nasal cannula watching TV. Respiratory therapy at bedside preparing to administer nebulizer treatment HEENT:  Moist mucus membranes. No oral ulcers. PERRL. Cardiovascular:  Regular rate. Sinus rhythm on telemetry. No appreciable JVD and given positioning and body habitus.  Pulmonary:  good aeration & clear to auscultation bilaterally. Minimal work of breathing on nasal cannula. Abdomen: Soft. Protuberant. Nontender. Musculoskeletal:  Normal bulk and tone. No joint deformity or effusion appreciated.  LABS:  CBC  Recent Labs Lab 01/03/15 1413 01/04/15 0441 01/07/15 0425  WBC 14.5* 12.0* 12.6*  HGB 11.4* 10.8* 10.4*  HCT 41.7 38.7 35.4*  PLT 244 252 251   Coag's  Recent Labs Lab 01/03/15 1413  APTT 32  INR 1.04   BMET  Recent Labs Lab 01/07/15 0425 01/08/15 0315 01/09/15 0244  NA 139 137 135  K 4.4 4.1 4.0  CL 92* 91* 85*  CO2 42* 41* 41*  BUN CREATININE 0.41* 0.47 0.46  GLUCOSE 90 95 97   Electrolytes  Recent Labs Lab 01/03/15 1413  01/07/15 0425 01/08/15 0315 01/09/15 0244  CALCIUM  --   < > 8.7* 8.7* 8.6*  MG 1.9  --  2.0  --   --   PHOS 3.7  --  3.5 3.6 3.3  < > = values in this interval not displayed. Sepsis Markers  Recent Labs Lab 01/03/15 1413  LATICACIDVEN 5.5*  PROCALCITON 0.19   ABG  Recent Labs Lab 01/03/15  1015 01/03/15 1429 01/04/15 0315  PHART 7.167* 7.309* 7.323*  PCO2ART 135* 96.8* 95.3*  PO2ART 95.2 99.0 128*   Liver Enzymes  Recent Labs Lab 01/07/15 0425 01/08/15 0315 01/09/15 0244  ALBUMIN 2.7* 2.7* 2.7*   Cardiac Enzymes  Recent Labs Lab 01/03/15 0715  TROPONINI <0.03   Glucose  Recent Labs Lab 01/04/15 1209  GLUCAP 139*    Imaging No results found.  MICROBIOLOGY: BCx2 9/30 - negative C diff 10/3 - negative  ANTIBIOTICS: Unasyn 9/30-10/3  ASSESSMENT / PLAN: 64 year old female admitted for acute on chronic hypercapnic respiratory failure. continues to remain stable.  Tolerating all medication regimen at this point with the exception of Lasix. Patient is tolerating BiPAP overnight. As she has remained clinically stable I believe we will be able to transition her to home today.   1. Acute on chronic hypercarbic respiratory failure: Improving. Continuing nocturnal BiPAP. Continuing scheduled Atrovent. Continuing Brovana & budesonide. Plan for a 2 week prednisone taper.  2. Acute on chronic hypoxic respiratory failure: Continuing to wean FiO2 for saturation 88-92 % to minimize VQ mismatching and hyperoxygenation. continuing to hold Lasix.  3. Watery diarrhea: Stool C. difficile negative. Unasyn discontinued 10/03. 4. Possible aspiration pneumonia versus pneumonino evidence of aspiration on swallow evaluation. Treated with Unasyn for 4 days.  5. Chronic systolic congestive heart failure: continuing aspirin 81 mg by mouth daily, Coreg 3.125 mg by mouth twice a day, & Pravachol 20 mg by mouth daily at bedtime today. Holding on Lasix at this time given her marginal blood pressure. 6. Diet: Passed swallow evaluation. Continuing heart healthy carb modified diet. 7. Prophylaxis: Continuing heparin subcutaneous every 8 hours. Protonix to by mouth daily at bedtime. 8. Disposition: Discharge patient to home with home health and home physical therapy. Patient will need transport by ambulance.   Donna Christen Jamison Neighbor, M.D. [facility] Pulmonary & Critical Care Pager:  [phone] After 3pm or if no response, call [phone] 01/09/2015, 9:58 AM

## 2015-01-09 NOTE — Discharge Summary (Signed)
Physician Discharge Summary  Patient ID: [name] MRN: [id number] DOB/AGE: 1950-04-06 63 y.o.  Admit date: 01/03/2015 Discharge date: 01/09/2015    Discharge Diagnoses:  Active Problems:   OBESITY, MORBID   Obstructive sleep apnea   Anoxic brain damage (HCC)   Chronic diastolic heart failure (HCC)   Chronic bronchitis (HCC)   EMPHYSEMA   Acute on chronic respiratory failure (HCC)   Chronic respiratory failure (HCC)   Obesity hypoventilation syndrome (HCC)   COPD exacerbation (HCC)   Acute encephalopathy   S/P ankle fusion   GERD (gastroesophageal reflux disease)   Respiratory failure (HCC)    Brief Summary: [name] is a 64 y.o. y/o female with a plethora of health issues that include but limited to, OSA on qhs bipap, OHS, chronic resp failure, O2 dependent, hx of intubation with difficulty liberating from vent, MO, CHF, bilat fx ankles that have made her bed bound, general FTT who reports she has not ambulated since last summer after an ankle fx.  She presented 9/30 with acute hypercarbic resp failure thought multifactorial in setting decompensated OHS/OSA, AECOPD +/- decompensated heart failure.  All c/b noncompliance with medications and home bipap.  She initially had PH 7.2 with PCo2 130 and was started on bipap, treated with IV lasix, IV steroids, BD's, bipap, IV abx.  She improved slowly and transitioned back to qhs bipap, PO steroids and abx discontinued with little evidence for active infection.  Course c/b diarrhea with negative CDiff, now improving.  She remains significantly deconditioned which is not new this admit and will need ongoing max assistance at home and likely needs ongoing discussions regarding SNF placement at some point.   Consults: none  Lines/tubes: none  Microbiology/Sepsis markers: CDiff 10/3>> neg  BCx2 9/30>>> neg   Significant Diagnostic Studies:  none                                                                   Discharge Plan  by Discharge Diagnosis  Acute on chronic respiratory failure - multifactorial r/t decompensated OSA/OHS (likely r/t noncompliance with bipap), decompensated CHF, AECOPD.  AECOPD  OHS/OSA PLAN -  qhs bipap  duonebs, budesonide Limit sedating meds  Prednisone taper over 2 weeks  Supplemental O2 Monitor need to resume diuresis   Possible aspiration pneumonia versus pneumonia - no evidence of aspiration on swallow evaluation.  PLAN -  Treated with Unasyn for 4 days. No further abx   Chronic systolic heart failure  PLAN -  PCP f/u  Hold home lasix/aldactone for now given marginal BP- may need to resume as BP improved  Continue previous coreg  ASA  Heart healthy diet  pravachol  Hold home diltiazem   Deconditioning  Hx Noncompliance  PLAN -  Max home support including PT/OT/RN/Aide  Ongoing discussions re: SNF  Simplify home med regimen as able  In-home PCP visit in 1 week   Filed Vitals:   01/08/15 2121 01/09/15 0355 01/09/15 0735 01/09/15 0817  BP: 98/55 123/86 114/53   Pulse: 80 68 85   Temp: 97.7 F (36.5 C) 97.3 F (36.3 C) 98.2 F (36.8 C)   TempSrc: Oral Oral Oral   Resp: $Remo'21 14 19   'wCxxE$ Height:      Weight:  237  lb 10.5 oz (107.8 kg)    SpO2: 100% 100% 99% 100%     Discharge Labs  BMET  Recent Labs Lab 01/03/15 0715 01/03/15 1413 01/05/15 0235 01/07/15 0425 01/08/15 0315 01/09/15 0244  NA 144  --  138 139 137 135  K 4.4  --  4.6 4.4 4.1 4.0  CL 91*  --  87* 92* 91* 85*  CO2 49*  --  43* 42* 41* 41*  GLUCOSE 166*  --  153* 90 95 97  BUN 10  --  $R'18 18 14 14  'Tl$ CREATININE 0.53 0.67 0.59 0.41* 0.47 0.46  CALCIUM 9.1  --  9.0 8.7* 8.7* 8.6*  MG  --  1.9  --  2.0  --   --   PHOS  --  3.7  --  3.5 3.6 3.3     CBC   Recent Labs Lab 01/03/15 1413 01/04/15 0441 01/07/15 0425  HGB 11.4* 10.8* 10.4*  HCT 41.7 38.7 35.4*  WBC 14.5* 12.0* 12.6*  PLT 244 252 251   Anti-Coagulation  Recent Labs Lab 01/03/15 1413  INR 1.04           Follow-up Information    Follow up with Alvester Chou, NP On 01/14/2015.   Specialty:  Nurse Practitioner   Why:  in-home visit    Contact information:   [facility] [place] [address] [phone]          Medication List    STOP taking these medications        arformoterol 15 MCG/2ML Nebu  Commonly known as:  BROVANA     calcium carbonate 500 MG chewable tablet  Commonly known as:  TUMS - dosed in mg elemental calcium     diltiazem 180 MG 24 hr capsule  Commonly known as:  CARDIZEM CD     fentaNYL 50 MCG/HR  Commonly known as:  DURAGESIC - dosed mcg/hr     furosemide 40 MG tablet  Commonly known as:  LASIX     hydrOXYzine 25 MG capsule  Commonly known as:  VISTARIL     linagliptin 5 MG Tabs tablet  Commonly known as:  TRADJENTA     oxyCODONE 15 MG immediate release tablet  Commonly known as:  ROXICODONE     spironolactone 25 MG tablet  Commonly known as:  ALDACTONE      TAKE these medications        acetaminophen 500 MG tablet  Commonly known as:  TYLENOL  Take 1,000 mg by mouth every 6 (six) hours as needed for mild pain.     albuterol 108 (90 BASE) MCG/ACT inhaler  Commonly known as:  PROVENTIL HFA;VENTOLIN HFA  Inhale 2 puffs into the lungs every 6 (six) hours as needed for wheezing or shortness of breath.     aspirin EC 81 MG tablet  Take 81 mg by mouth daily.     budesonide 0.5 MG/2ML nebulizer solution  Commonly known as:  PULMICORT  Take 2 mLs (0.5 mg total) by nebulization 2 (two) times daily.     carvedilol 3.125 MG tablet  Commonly known as:  COREG  Take 3.125 mg by mouth daily.     guaiFENesin 600 MG 12 hr tablet  Commonly known as:  MUCINEX  Take 2 tablets (1,200 mg total) by mouth 2 (two) times daily.     ipratropium-albuterol 0.5-2.5 (3) MG/3ML Soln  Commonly known as:  DUONEB  Take 3 mLs  by nebulization every 6 (six) hours.     loratadine 10 MG tablet  Commonly known as:  CLARITIN   Take 1 tablet (10 mg total) by mouth daily.     pantoprazole 40 MG tablet  Commonly known as:  PROTONIX  Take 1 tablet (40 mg total) by mouth daily at 12 noon.     pravastatin 20 MG tablet  Commonly known as:  PRAVACHOL  Take 20 mg by mouth daily at 6 PM.     predniSONE 10 MG tablet  Commonly known as:  DELTASONE  3 tabs PO daily x 3 days, then 2 tabs PO daily x 3 days, then 1 tab PO daily x 3 days, then STOP.     Vitamin D (Ergocalciferol) 50000 UNITS Caps capsule  Commonly known as:  DRISDOL  Take 50,000 Units by mouth 2 (two) times a week.          Disposition:  Home with home health PT   Discharged Condition: [name] has met maximum benefit of inpatient care and is medically stable and cleared for discharge.  Patient is pending follow up as above.      Time spent on disposition:  Greater than 35 minutes.   Signed: Sonia Baller. Ashok Cordia, M.D. [facility] Pager:  [phone] After 3pm or if no response, call [phone] 01/09/2015  9:51 AM

## 2015-02-27 ENCOUNTER — Emergency Department (HOSPITAL_COMMUNITY): Payer: Medicaid Other

## 2015-02-27 ENCOUNTER — Inpatient Hospital Stay (HOSPITAL_COMMUNITY)
Admission: EM | Admit: 2015-02-27 | Discharge: 2015-03-01 | DRG: 189 | Disposition: A | Discharge status: Home Health | Payer: Medicaid Other | Attending: Family Medicine | Admitting: Family Medicine

## 2015-02-27 ENCOUNTER — Encounter (HOSPITAL_COMMUNITY): Payer: Self-pay | Admitting: Emergency Medicine

## 2015-02-27 DIAGNOSIS — I480 Paroxysmal atrial fibrillation: Secondary | ICD-10-CM | POA: Diagnosis present

## 2015-02-27 DIAGNOSIS — J441 Chronic obstructive pulmonary disease with (acute) exacerbation: Secondary | ICD-10-CM | POA: Diagnosis present

## 2015-02-27 DIAGNOSIS — E662 Morbid (severe) obesity with alveolar hypoventilation: Secondary | ICD-10-CM | POA: Diagnosis present

## 2015-02-27 DIAGNOSIS — Z87891 Personal history of nicotine dependence: Secondary | ICD-10-CM

## 2015-02-27 DIAGNOSIS — E785 Hyperlipidemia, unspecified: Secondary | ICD-10-CM | POA: Diagnosis present

## 2015-02-27 DIAGNOSIS — J9621 Acute and chronic respiratory failure with hypoxia: Principal | ICD-10-CM | POA: Diagnosis present

## 2015-02-27 DIAGNOSIS — E1165 Type 2 diabetes mellitus with hyperglycemia: Secondary | ICD-10-CM | POA: Diagnosis present

## 2015-02-27 DIAGNOSIS — S82892D Other fracture of left lower leg, subsequent encounter for closed fracture with routine healing: Secondary | ICD-10-CM

## 2015-02-27 DIAGNOSIS — Z66 Do not resuscitate: Secondary | ICD-10-CM | POA: Diagnosis present

## 2015-02-27 DIAGNOSIS — Z9119 Patient's noncompliance with other medical treatment and regimen: Secondary | ICD-10-CM

## 2015-02-27 DIAGNOSIS — Z9981 Dependence on supplemental oxygen: Secondary | ICD-10-CM

## 2015-02-27 DIAGNOSIS — L309 Dermatitis, unspecified: Secondary | ICD-10-CM | POA: Diagnosis present

## 2015-02-27 DIAGNOSIS — J9622 Acute and chronic respiratory failure with hypercapnia: Secondary | ICD-10-CM | POA: Diagnosis present

## 2015-02-27 DIAGNOSIS — E119 Type 2 diabetes mellitus without complications: Secondary | ICD-10-CM

## 2015-02-27 DIAGNOSIS — K219 Gastro-esophageal reflux disease without esophagitis: Secondary | ICD-10-CM | POA: Diagnosis present

## 2015-02-27 DIAGNOSIS — Z7401 Bed confinement status: Secondary | ICD-10-CM

## 2015-02-27 DIAGNOSIS — I4891 Unspecified atrial fibrillation: Secondary | ICD-10-CM | POA: Diagnosis present

## 2015-02-27 DIAGNOSIS — J309 Allergic rhinitis, unspecified: Secondary | ICD-10-CM | POA: Diagnosis present

## 2015-02-27 DIAGNOSIS — J9620 Acute and chronic respiratory failure, unspecified whether with hypoxia or hypercapnia: Secondary | ICD-10-CM | POA: Diagnosis present

## 2015-02-27 DIAGNOSIS — F419 Anxiety disorder, unspecified: Secondary | ICD-10-CM | POA: Diagnosis present

## 2015-02-27 DIAGNOSIS — S82891D Other fracture of right lower leg, subsequent encounter for closed fracture with routine healing: Secondary | ICD-10-CM

## 2015-02-27 DIAGNOSIS — G4733 Obstructive sleep apnea (adult) (pediatric): Secondary | ICD-10-CM | POA: Diagnosis present

## 2015-02-27 DIAGNOSIS — I5042 Chronic combined systolic (congestive) and diastolic (congestive) heart failure: Secondary | ICD-10-CM | POA: Diagnosis present

## 2015-02-27 DIAGNOSIS — X58XXXD Exposure to other specified factors, subsequent encounter: Secondary | ICD-10-CM | POA: Diagnosis present

## 2015-02-27 DIAGNOSIS — J45909 Unspecified asthma, uncomplicated: Secondary | ICD-10-CM | POA: Diagnosis present

## 2015-02-27 DIAGNOSIS — Z6836 Body mass index (BMI) 36.0-36.9, adult: Secondary | ICD-10-CM

## 2015-02-27 LAB — COMPREHENSIVE METABOLIC PANEL
ALK PHOS: 88 U/L (ref 38–126)
ALT: 14 U/L (ref 14–54)
ANION GAP: 3 — AB (ref 5–15)
AST: 17 U/L (ref 15–41)
Albumin: 3.2 g/dL — ABNORMAL LOW (ref 3.5–5.0)
BILIRUBIN TOTAL: 0.3 mg/dL (ref 0.3–1.2)
BUN: 9 mg/dL (ref 6–20)
CALCIUM: 9.4 mg/dL (ref 8.9–10.3)
CO2: 47 mmol/L — ABNORMAL HIGH (ref 22–32)
CREATININE: 0.54 mg/dL (ref 0.44–1.00)
Chloride: 88 mmol/L — ABNORMAL LOW (ref 101–111)
GFR calc non Af Amer: >60 mL/min (ref >60)
Glucose, Bld: 216 mg/dL — ABNORMAL HIGH (ref 65–99)
Potassium: 5.2 mmol/L — ABNORMAL HIGH (ref 3.5–5.1)
Sodium: 138 mmol/L (ref 135–145)
TOTAL PROTEIN: 7.4 g/dL (ref 6.5–8.1)

## 2015-02-27 LAB — CBC WITH DIFFERENTIAL/PLATELET
BASOS ABS: 0 10*3/uL (ref 0.0–0.1)
BASOS PCT: 0 %
EOS ABS: 0.6 10*3/uL (ref 0.0–0.7)
Eosinophils Relative: 5 %
HEMATOCRIT: 42.1 % (ref 36.0–46.0)
HEMOGLOBIN: 11.6 g/dL — AB (ref 12.0–15.0)
Lymphocytes Relative: 24 %
Lymphs Abs: 2.8 10*3/uL (ref 0.7–4.0)
MCH: 29.9 pg (ref 26.0–34.0)
MCHC: 27.6 g/dL — ABNORMAL LOW (ref 30.0–36.0)
MCV: 108.5 fL — ABNORMAL HIGH (ref 78.0–100.0)
MONO ABS: 1 10*3/uL (ref 0.1–1.0)
Monocytes Relative: 9 %
NEUTROS ABS: 7.2 10*3/uL (ref 1.7–7.7)
NEUTROS PCT: 62 %
Platelets: 271 10*3/uL (ref 150–400)
RBC: 3.88 MIL/uL (ref 3.87–5.11)
RDW: 13.9 % (ref 11.5–15.5)
WBC: 11.2 10*3/uL — ABNORMAL HIGH (ref 4.0–10.5)

## 2015-02-27 LAB — BRAIN NATRIURETIC PEPTIDE: B NATRIURETIC PEPTIDE 5: 9.1 pg/mL (ref 0.0–100.0)

## 2015-02-27 LAB — I-STAT TROPONIN, ED: TROPONIN I, POC: 0 ng/mL (ref 0.00–0.08)

## 2015-02-27 NOTE — Progress Notes (Signed)
Charge RT at bedside to attempt abg

## 2015-02-27 NOTE — Progress Notes (Signed)
2nd abg collected 

## 2015-02-27 NOTE — ED Notes (Signed)
Pt placed in gown, pulse ox, bp cuff, and cardiac monitoring.

## 2015-02-27 NOTE — Progress Notes (Signed)
abg results given to MD 

## 2015-02-27 NOTE — Progress Notes (Signed)
ABG results not crossing over to EPIC. Results are critical. Results given to MD   Results are: pH 7.19, CO2 >100.0, PaO2 74, HCO3 <> (unreadable) SO2 <> (unreadable)

## 2015-02-27 NOTE — ED Notes (Signed)
PER EMS: Patient comes from home c/o COPD exacerbation starting earlier this evening. Hx CHF. Per EMS, patient's son gave patient 2 neb treatments - albuterol and atrovent. Patient is on 3L O2 Maish Vaya at home.  Upon EMS arrival, patient's SpO2 in the 70s, EMS gave 5mg  albuterol, 0.5mg  atrovent, and 125mg  solumedrol. 20g. L hand. Patient currently 100% on 8L neb treatment, but with increased respiratory effort, accessory muscle use, also appears to be more lethargic than baseline, per EMS. Patient A&O x 4, but slow to respond d/t labored breathing.

## 2015-02-27 NOTE — ED Notes (Signed)
Respiratory at bedside, attempting blood gas draw. Patient continuing to lean over and fall asleep. MD made aware.

## 2015-02-27 NOTE — Progress Notes (Signed)
BiPAP started per ABG, MD verbal order. Family at bedisde

## 2015-02-27 NOTE — ED Provider Notes (Addendum)
CSN: [id number]     Arrival date & time 02/27/15  1950 History   First MD Initiated Contact with Patient 02/27/15 1956     Chief Complaint  Patient presents with  . Shortness of Breath     (Consider location/radiation/quality/duration/timing/severity/associated sxs/prior Treatment) HPI Comments: Patient is a 64 year old female with history of advanced COPD. She presents for evaluation of difficulty breathing and increased somnolence over the past several hours. She was found to be hypoxic by EMS despite her home oxygen. She denies any fevers or chills. She denies any chest pain. From reviewing her record, it is been noticed that she has been intubated in the past for hypercarbic respiratory failure.  Patient is a 64 y.o. female presenting with shortness of breath. The history is provided by the patient.  Shortness of Breath Severity:  Severe Onset quality:  Sudden Duration:  2 hours Timing:  Constant Progression:  Worsening Chronicity:  Recurrent Relieved by:  Nothing Worsened by:  Nothing tried Ineffective treatments:  None tried Associated symptoms: no chest pain and no fever     Past Medical History  Diagnosis Date  . Asthma     with exaerbation and admission in 5/12. no h/ intubation. Is also suspected to have some degree of restrictive pattern and moderate emphysema  (per Dr. Teddy Spike note0 and h/o smoking in past.   . Obesity hypoventilation syndrome (HCC)     followed with Dr. Stanton Kidney ([facility]) once in 2012.   Marland Kitchen OSA (obstructive sleep apnea)     on CPAP qhs. Sleep study in 2009 confirmed. Not very compliant with CPAP  . Diastolic CHF (HCC)     Echo 2009 confirmed (poor acoustic window) , EF 55%,, mild RVH  . Physical deconditioning     wheelchair bound, unable to perforrm ADLs at home  . HLD (hyperlipidemia)   . COPD (chronic obstructive pulmonary disease) (HCC)     uses O2 4L   . Dysrhythmia     atrial fib  . Diabetes mellitus without complication (HCC)   .  Anxiety   . Chronic respiratory failure [facility])    Past Surgical History  Procedure Laterality Date  . Cesarean section    . Ankle fusion Left 01/18/2014    Procedure: Left Tibiocalcaneal Fusion;  Surgeon: Nadara Mustard, MD;  Location: [facility] OR;  Service: Orthopedics;  Laterality: Left;   Family History  Problem Relation Age of Onset  . COPD Father    Social History  Substance Use Topics  . Smoking status: Former Smoker -- 1.00 packs/day for 30 years    Types: Cigarettes    Quit date: 04/05/1993  . Smokeless tobacco: Never Used  . Alcohol Use: No     Comment: " drank beer years ago "   OB History    No data available     Review of Systems  Constitutional: Negative for fever.  Respiratory: Positive for shortness of breath.   Cardiovascular: Negative for chest pain.  All other systems reviewed and are negative.     Allergies  Codeine and Fentanyl  Home Medications   Prior to Admission medications   Medication Sig Start Date End Date Taking? Authorizing Provider  acetaminophen (TYLENOL) 500 MG tablet Take 1,000 mg by mouth every 6 (six) hours as needed for mild pain.     Historical Provider, MD  albuterol (PROVENTIL HFA;VENTOLIN HFA) 108 (90 BASE) MCG/ACT inhaler Inhale 2 puffs into the lungs every 6 (six) hours as needed for wheezing or shortness  of breath. 01/09/15   Bernadene PersonKathryn A Whiteheart, NP  aspirin EC 81 MG tablet Take 81 mg by mouth daily.    Historical Provider, MD  budesonide (PULMICORT) 0.5 MG/2ML nebulizer solution Take 2 mLs (0.5 mg total) by nebulization 2 (two) times daily. 01/09/15   Bernadene PersonKathryn A Whiteheart, NP  carvedilol (COREG) 3.125 MG tablet Take 3.125 mg by mouth daily.    Historical Provider, MD  guaiFENesin (MUCINEX) 600 MG 12 hr tablet Take 2 tablets (1,200 mg total) by mouth 2 (two) times daily. Patient not taking: Reported on 01/03/2015 12/03/13   Zannie CovePreetha Joseph, MD  ipratropium-albuterol (DUONEB) 0.5-2.5 (3) MG/3ML SOLN Take 3 mLs by nebulization every 6  (six) hours. 12/03/13   Zannie CovePreetha Joseph, MD  loratadine (CLARITIN) 10 MG tablet Take 1 tablet (10 mg total) by mouth daily. 01/09/15   Bernadene PersonKathryn A Whiteheart, NP  pantoprazole (PROTONIX) 40 MG tablet Take 1 tablet (40 mg total) by mouth daily at 12 noon. 12/03/13   Zannie CovePreetha Joseph, MD  pravastatin (PRAVACHOL) 20 MG tablet Take 20 mg by mouth daily at 6 PM.     Historical Provider, MD  predniSONE (DELTASONE) 10 MG tablet 3 tabs PO daily x 3 days, then 2 tabs PO daily x 3 days, then 1 tab PO daily x 3 days, then STOP. 01/09/15   Bernadene PersonKathryn A Whiteheart, NP  Vitamin D, Ergocalciferol, (DRISDOL) 50000 UNITS CAPS capsule Take 50,000 Units by mouth 2 (two) times a week.    Historical Provider, MD   BP 113/50 mmHg  Pulse 89  Temp(Src) 97.9 F (36.6 C) (Oral)  Resp 19  SpO2 97% Physical Exam  Constitutional: She is oriented to person, place, and time. She appears well-developed and well-nourished. No distress.  HENT:  Head: Normocephalic and atraumatic.  Neck: Normal range of motion. Neck supple.  Cardiovascular: Normal rate and regular rhythm.  Exam reveals no gallop and no friction rub.   No murmur heard. Pulmonary/Chest: Effort normal. No respiratory distress. She has wheezes. She has no rales.  The patient is in moderate respiratory distress. There are expiratory rhonchi bilaterally.  Abdominal: Soft. Bowel sounds are normal. She exhibits no distension. There is no tenderness.  Musculoskeletal: Normal range of motion. She exhibits no edema.  Neurological: She is alert and oriented to person, place, and time.  Skin: Skin is warm and dry. She is not diaphoretic.  Nursing note and vitals reviewed.   ED Course  Procedures (including critical care time) Labs Review Labs Reviewed  COMPREHENSIVE METABOLIC PANEL - Abnormal; Notable for the following:    Potassium 5.2 (*)    Chloride 88 (*)    CO2 47 (*)    Glucose, Bld 216 (*)    Albumin 3.2 (*)    Anion gap 3 (*)    All other components within  normal limits  CBC WITH DIFFERENTIAL/PLATELET - Abnormal; Notable for the following:    WBC 11.2 (*)    Hemoglobin 11.6 (*)    MCV 108.5 (*)    MCHC 27.6 (*)    All other components within normal limits  BRAIN NATRIURETIC PEPTIDE  BLOOD GAS, ARTERIAL  I-STAT TROPOININ, ED    Imaging Review Dg Chest Port 1 View  02/27/2015  CLINICAL DATA:  Shortness of Breath EXAM: PORTABLE CHEST 1 VIEW COMPARISON:  January 05, 2015 FINDINGS: There is no edema or consolidation. Heart is borderline enlarged with pulmonary vascular within normal limits. No adenopathy. No bone lesions. IMPRESSION: Borderline cardiac enlargement.  No edema or consolidation.  Electronically Signed   By: Bretta Bang III M.D.   On: 02/27/2015 20:26   I have personally reviewed and evaluated these images and lab results as part of my medical decision-making.   EKG Interpretation   Date/Time:  Thursday February 27 2015 20:07:23 EST Ventricular Rate:  91 PR Interval:  153 QRS Duration: 91 QT Interval:  346 QTC Calculation: 426 R Axis:   76 Text Interpretation:  Sinus rhythm Normal ECG Confirmed by Jaleal Schliep  MD,  Meridee Branum ([id number]) on 02/27/2015 8:38:00 PM      MDM   Final diagnoses:  None    Patient presents with shortness of breath. She has a history of COPD. She is also less responsive. She has been intubated in the past for hypercarbic respiratory failure. Her ABG today reveals a pH of 7.2 and her PCO2 is greater than 100. She was placed on BiPAP for 1 hour and her blood gas has not improved much. She does appear somewhat more alert. I've discussed this case with Dr. Darrick Penna from critical care. She will send the mid-level to evaluate the patient.  CRITICAL CARE Performed by: Geoffery Lyons Total critical care time: 45 minutes Critical care time was exclusive of separately billable procedures and treating other patients. Critical care was necessary to treat or prevent imminent or life-threatening  deterioration. Critical care was time spent personally by me on the following activities: development of treatment plan with patient and/or surrogate as well as nursing, discussions with consultants, evaluation of patient's response to treatment, examination of patient, obtaining history from patient or surrogate, ordering and performing treatments and interventions, ordering and review of laboratory studies, ordering and review of radiographic studies, pulse oximetry and re-evaluation of patient's condition.     Geoffery Lyons, MD 02/27/15 2344   Patient has been evaluated by Critical Care.  They feel as though she is appropriate for admission to Step Down under the care of the hospitalist.  She is a full code, however does not want intubation.  Geoffery Lyons, MD 02/28/15 [phone]

## 2015-02-27 NOTE — Progress Notes (Signed)
Tried abg twice unsuccessful. Only got flash return blood, plus patient keeps moving arm.  Engineer, technical salesCalled Charge Therapist to come and give it a try. RTstated she will be here in 5min to give it a try.

## 2015-02-27 NOTE — Progress Notes (Signed)
2nd Drawn ABG results not crossing over to EPIC. Results are critical. Results given to MD    Results are: pH 7.24, CO2 >100.0, PaO2 84, HCO3 <> (unreadable) SO2 <> (unreadable)  Blood ph has improved minimally with Bipap being on 2hours.

## 2015-02-27 NOTE — ED Notes (Signed)
Patient being placed on Bi-PAP now. Family at bedside. Patient more awake now, A&Ox4.

## 2015-02-27 NOTE — Progress Notes (Signed)
abg collected  

## 2015-02-27 NOTE — ED Notes (Signed)
Intensivist at bedside.

## 2015-02-27 NOTE — ED Notes (Signed)
MD at bedside. 

## 2015-02-28 ENCOUNTER — Encounter (HOSPITAL_COMMUNITY): Payer: Self-pay | Admitting: Family Medicine

## 2015-02-28 ENCOUNTER — Inpatient Hospital Stay (HOSPITAL_COMMUNITY): Payer: Medicaid Other

## 2015-02-28 DIAGNOSIS — Z66 Do not resuscitate: Secondary | ICD-10-CM | POA: Diagnosis present

## 2015-02-28 DIAGNOSIS — J45909 Unspecified asthma, uncomplicated: Secondary | ICD-10-CM | POA: Diagnosis present

## 2015-02-28 DIAGNOSIS — X58XXXD Exposure to other specified factors, subsequent encounter: Secondary | ICD-10-CM | POA: Diagnosis present

## 2015-02-28 DIAGNOSIS — Z6836 Body mass index (BMI) 36.0-36.9, adult: Secondary | ICD-10-CM | POA: Diagnosis not present

## 2015-02-28 DIAGNOSIS — I509 Heart failure, unspecified: Secondary | ICD-10-CM | POA: Diagnosis not present

## 2015-02-28 DIAGNOSIS — J9621 Acute and chronic respiratory failure with hypoxia: Principal | ICD-10-CM | POA: Diagnosis present

## 2015-02-28 DIAGNOSIS — S82892D Other fracture of left lower leg, subsequent encounter for closed fracture with routine healing: Secondary | ICD-10-CM | POA: Diagnosis not present

## 2015-02-28 DIAGNOSIS — S82891D Other fracture of right lower leg, subsequent encounter for closed fracture with routine healing: Secondary | ICD-10-CM | POA: Diagnosis not present

## 2015-02-28 DIAGNOSIS — J9622 Acute and chronic respiratory failure with hypercapnia: Secondary | ICD-10-CM | POA: Diagnosis not present

## 2015-02-28 DIAGNOSIS — J309 Allergic rhinitis, unspecified: Secondary | ICD-10-CM | POA: Diagnosis present

## 2015-02-28 DIAGNOSIS — Z9981 Dependence on supplemental oxygen: Secondary | ICD-10-CM | POA: Diagnosis not present

## 2015-02-28 DIAGNOSIS — E119 Type 2 diabetes mellitus without complications: Secondary | ICD-10-CM

## 2015-02-28 DIAGNOSIS — F419 Anxiety disorder, unspecified: Secondary | ICD-10-CM | POA: Diagnosis present

## 2015-02-28 DIAGNOSIS — I4891 Unspecified atrial fibrillation: Secondary | ICD-10-CM

## 2015-02-28 DIAGNOSIS — Z9119 Patient's noncompliance with other medical treatment and regimen: Secondary | ICD-10-CM | POA: Diagnosis not present

## 2015-02-28 DIAGNOSIS — K219 Gastro-esophageal reflux disease without esophagitis: Secondary | ICD-10-CM | POA: Diagnosis present

## 2015-02-28 DIAGNOSIS — E662 Morbid (severe) obesity with alveolar hypoventilation: Secondary | ICD-10-CM

## 2015-02-28 DIAGNOSIS — R0602 Shortness of breath: Secondary | ICD-10-CM | POA: Diagnosis present

## 2015-02-28 DIAGNOSIS — Z87891 Personal history of nicotine dependence: Secondary | ICD-10-CM | POA: Diagnosis not present

## 2015-02-28 DIAGNOSIS — I480 Paroxysmal atrial fibrillation: Secondary | ICD-10-CM | POA: Diagnosis present

## 2015-02-28 DIAGNOSIS — Z7401 Bed confinement status: Secondary | ICD-10-CM | POA: Diagnosis not present

## 2015-02-28 DIAGNOSIS — J441 Chronic obstructive pulmonary disease with (acute) exacerbation: Secondary | ICD-10-CM | POA: Diagnosis not present

## 2015-02-28 DIAGNOSIS — G4733 Obstructive sleep apnea (adult) (pediatric): Secondary | ICD-10-CM | POA: Diagnosis present

## 2015-02-28 DIAGNOSIS — E1165 Type 2 diabetes mellitus with hyperglycemia: Secondary | ICD-10-CM | POA: Diagnosis present

## 2015-02-28 DIAGNOSIS — E785 Hyperlipidemia, unspecified: Secondary | ICD-10-CM | POA: Diagnosis present

## 2015-02-28 DIAGNOSIS — L309 Dermatitis, unspecified: Secondary | ICD-10-CM | POA: Diagnosis present

## 2015-02-28 DIAGNOSIS — I5042 Chronic combined systolic (congestive) and diastolic (congestive) heart failure: Secondary | ICD-10-CM | POA: Diagnosis present

## 2015-02-28 LAB — CBC
HCT: 44.5 % (ref 36.0–46.0)
Hemoglobin: 12.4 g/dL (ref 12.0–15.0)
MCH: 30 pg (ref 26.0–34.0)
MCHC: 27.9 g/dL — ABNORMAL LOW (ref 30.0–36.0)
MCV: 107.5 fL — ABNORMAL HIGH (ref 78.0–100.0)
Platelets: 286 10*3/uL (ref 150–400)
RBC: 4.14 MIL/uL (ref 3.87–5.11)
RDW: 13.6 % (ref 11.5–15.5)
WBC: 12.2 10*3/uL — ABNORMAL HIGH (ref 4.0–10.5)

## 2015-02-28 LAB — BLOOD GAS, ARTERIAL
Acid-Base Excess: 19.6 mmol/L — ABNORMAL HIGH (ref 0.0–2.0)
Bicarbonate: 47.4 mEq/L — ABNORMAL HIGH (ref 20.0–24.0)
Delivery systems: POSITIVE
Drawn by: 31101
Expiratory PAP: 6
FIO2: 0.4
Inspiratory PAP: 14
O2 Saturation: 99.1 %
Patient temperature: 98.6
RATE: 10 resp/min
TCO2: 50.7 mmol/L (ref 0–100)
pCO2 arterial: 108 mmHg (ref 35.0–45.0)
pH, Arterial: 7.263 — ABNORMAL LOW (ref 7.350–7.450)
pO2, Arterial: 188 mmHg — ABNORMAL HIGH (ref 80.0–100.0)

## 2015-02-28 LAB — GLUCOSE, CAPILLARY
GLUCOSE-CAPILLARY: 124 mg/dL — AB (ref 65–99)
Glucose-Capillary: 173 mg/dL — ABNORMAL HIGH (ref 65–99)
Glucose-Capillary: 173 mg/dL — ABNORMAL HIGH (ref 65–99)
Glucose-Capillary: 168 mg/dL — ABNORMAL HIGH (ref 65–99)
Glucose-Capillary: 132 mg/dL — ABNORMAL HIGH (ref 65–99)

## 2015-02-28 LAB — PROCALCITONIN: PROCALCITONIN: 0.14 ng/mL

## 2015-02-28 LAB — BASIC METABOLIC PANEL
Anion gap: 8 (ref 5–15)
BUN: 14 mg/dL (ref 6–20)
CO2: 45 mmol/L — ABNORMAL HIGH (ref 22–32)
Calcium: 9.7 mg/dL (ref 8.9–10.3)
Chloride: 87 mmol/L — ABNORMAL LOW (ref 101–111)
Creatinine, Ser: 0.68 mg/dL (ref 0.44–1.00)
GFR calc Af Amer: >60 mL/min (ref >60)
GFR calc non Af Amer: >60 mL/min (ref >60)
Glucose, Bld: 198 mg/dL — ABNORMAL HIGH (ref 65–99)
Potassium: 5 mmol/L (ref 3.5–5.1)
Sodium: 140 mmol/L (ref 135–145)

## 2015-02-28 LAB — MRSA PCR SCREENING: MRSA BY PCR: POSITIVE — AB

## 2015-02-28 MED ORDER — CHLORHEXIDINE GLUCONATE CLOTH 2 % EX PADS: 6.0000 | MEDICATED_PAD | Freq: Every day | CUTANEOUS | Status: DC

## 2015-02-28 MED ORDER — INSULIN ASPART 100 UNIT/ML ~~LOC~~ SOLN: 0.0000 [IU] | Freq: Every day | SUBCUTANEOUS | Status: DC

## 2015-02-28 MED ORDER — IPRATROPIUM-ALBUTEROL 0.5-2.5 (3) MG/3ML IN SOLN
3.0000 mL | Freq: Four times a day (QID) | RESPIRATORY_TRACT | Status: DC
  Administered 2015-02-28: 3 mL via RESPIRATORY_TRACT
  Filled 2015-02-28: qty 3

## 2015-02-28 MED ORDER — LORATADINE 10 MG PO TABS
10.0000 mg | ORAL_TABLET | Freq: Every day | ORAL | Status: DC
  Administered 2015-03-01: 10 mg via ORAL
  Filled 2015-02-28 (×2): qty 1

## 2015-02-28 MED ORDER — ALBUTEROL SULFATE (2.5 MG/3ML) 0.083% IN NEBU
2.5000 mg | INHALATION_SOLUTION | RESPIRATORY_TRACT | Status: DC
  Administered 2015-02-28 – 2015-03-01 (×9): 2.5 mg via RESPIRATORY_TRACT
  Filled 2015-02-28 (×8): qty 3

## 2015-02-28 MED ORDER — ASPIRIN EC 81 MG PO TBEC
81.0000 mg | DELAYED_RELEASE_TABLET | Freq: Every day | ORAL | Status: DC
  Administered 2015-02-28 – 2015-03-01 (×2): 81 mg via ORAL
  Filled 2015-02-28 (×2): qty 1

## 2015-02-28 MED ORDER — MUPIROCIN 2 % EX OINT
1.0000 "application " | TOPICAL_OINTMENT | Freq: Two times a day (BID) | CUTANEOUS | Status: DC
  Administered 2015-02-28 – 2015-03-01 (×3): 1 via NASAL
  Filled 2015-02-28: qty 22

## 2015-02-28 MED ORDER — ENOXAPARIN SODIUM 40 MG/0.4ML ~~LOC~~ SOLN
40.0000 mg | SUBCUTANEOUS | Status: DC
  Administered 2015-02-28: 40 mg via SUBCUTANEOUS
  Filled 2015-02-28: qty 0.4

## 2015-02-28 MED ORDER — FUROSEMIDE 10 MG/ML IJ SOLN
60.0000 mg | Freq: Once | INTRAMUSCULAR | Status: AC
  Administered 2015-02-28: 60 mg via INTRAVENOUS
  Filled 2015-02-28: qty 6

## 2015-02-28 MED ORDER — BUDESONIDE 0.25 MG/2ML IN SUSP: 0.5000 mg | Freq: Two times a day (BID) | RESPIRATORY_TRACT | Status: DC

## 2015-02-28 MED ORDER — PERFLUTREN LIPID MICROSPHERE
1.0000 mL | INTRAVENOUS | Status: AC | PRN
  Filled 2015-02-28: qty 10

## 2015-02-28 MED ORDER — BUDESONIDE 0.5 MG/2ML IN SUSP
0.5000 mg | Freq: Two times a day (BID) | RESPIRATORY_TRACT | Status: DC
  Administered 2015-02-28 – 2015-03-01 (×3): 0.5 mg via RESPIRATORY_TRACT
  Filled 2015-02-28 (×7): qty 2

## 2015-02-28 MED ORDER — CARVEDILOL 3.125 MG PO TABS
3.1250 mg | ORAL_TABLET | Freq: Every day | ORAL | Status: DC
  Filled 2015-02-28: qty 1

## 2015-02-28 MED ORDER — ALBUTEROL SULFATE (2.5 MG/3ML) 0.083% IN NEBU
2.5000 mg | INHALATION_SOLUTION | RESPIRATORY_TRACT | Status: DC | PRN
  Filled 2015-02-28: qty 3

## 2015-02-28 MED ORDER — RIVAROXABAN 20 MG PO TABS
20.0000 mg | ORAL_TABLET | Freq: Every day | ORAL | Status: DC
  Administered 2015-02-28: 20 mg via ORAL
  Filled 2015-02-28 (×2): qty 1

## 2015-02-28 MED ORDER — ALBUTEROL SULFATE (2.5 MG/3ML) 0.083% IN NEBU: 2.5000 mg | INHALATION_SOLUTION | RESPIRATORY_TRACT | Status: DC | PRN

## 2015-02-28 MED ORDER — PANTOPRAZOLE SODIUM 40 MG PO TBEC
40.0000 mg | DELAYED_RELEASE_TABLET | Freq: Every day | ORAL | Status: DC
  Administered 2015-02-28 – 2015-03-01 (×2): 40 mg via ORAL
  Filled 2015-02-28 (×2): qty 1

## 2015-02-28 MED ORDER — PRAVASTATIN SODIUM 20 MG PO TABS
20.0000 mg | ORAL_TABLET | Freq: Every day | ORAL | Status: DC
  Administered 2015-02-28: 20 mg via ORAL
  Filled 2015-02-28 (×2): qty 1

## 2015-02-28 MED ORDER — CETAPHIL MOISTURIZING EX LOTN
TOPICAL_LOTION | Freq: Two times a day (BID) | CUTANEOUS | Status: DC
  Administered 2015-02-28: via TOPICAL
  Administered 2015-02-28: 1 via TOPICAL
  Administered 2015-03-01: 09:00:00 via TOPICAL
  Filled 2015-02-28: qty 473

## 2015-02-28 MED ORDER — PREDNISONE 50 MG PO TABS
60.0000 mg | ORAL_TABLET | Freq: Every day | ORAL | Status: DC
  Administered 2015-02-28 – 2015-03-01 (×2): 60 mg via ORAL
  Filled 2015-02-28 (×2): qty 1

## 2015-02-28 MED ORDER — INSULIN ASPART 100 UNIT/ML ~~LOC~~ SOLN
0.0000 [IU] | Freq: Three times a day (TID) | SUBCUTANEOUS | Status: DC
  Administered 2015-02-28 (×2): 2 [IU] via SUBCUTANEOUS
  Administered 2015-02-28 – 2015-03-01 (×2): 1 [IU] via SUBCUTANEOUS

## 2015-02-28 MED ORDER — CARVEDILOL 6.25 MG PO TABS
6.2500 mg | ORAL_TABLET | Freq: Every day | ORAL | Status: DC
  Administered 2015-03-01: 6.25 mg via ORAL
  Filled 2015-02-28 (×2): qty 1

## 2015-02-28 NOTE — Progress Notes (Signed)
Received report from ED RN and informed her that bed for room isn't clean. Environmental Services paged @0049  to clean bed initially and re-paged by my Charge RN, Isidor HoltsLee Worley, while report received. Informed ED RN that I would page her once bed cleaning was complete.

## 2015-02-28 NOTE — Progress Notes (Signed)
Brandy StainCora E [name] [id number] DOB: Apr 17, 1950 DOA: 02/27/2015 PCP: Marletta LorBarr, Julie, NP   Brief narrative: 64 y/o ? recent admission 9/30-10/6/16 OSA/OHSS on Bipap COPD ? 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   Admitted from home with acute episodic shortness of breath after Thanksgiving dinner-has significant anxiety and keep stating "I need a nerve pill" Taken off BiPAP while I was in the room and seemed to maintain sats above 90% here although with rapid talking and pressured speech drop to sats  Past medical history-As per Problem list Chart reviewed as below-   Consultants:  None  Procedures:  BiPAP  Antibiotics:  None   Subjective  Oriented alert feeling okay No specific issues at present    Objective    Interim History:  Telemetry: Sinus tach   Objective: Filed Vitals:   02/28/15 0404 02/28/15 0725 02/28/15 0830 02/28/15 0838  BP:      Pulse:      Temp:  97.9 F (36.6 C)    TempSrc:  Axillary    Resp:      Height:      Weight:      SpO2: 100%  100% 100%    Intake/Output Summary (Last 24 hours) at 02/28/15 1009 Last data filed at 02/28/15 0400  Gross per 24 hour  Intake      0 ml  Output      0 ml  Net      0 ml    Exam:  General: EOMI NCAT anxious Cardiovascular: S1-S2 tachycardic Respiratory: Clinically clear no added sound Abdomen: Soft nontender nondistended no rebound Skin no lower extremity edema Neuro moving all 4 limbs equally  Data Reviewed: Basic Metabolic Panel:  Recent Labs Lab 02/27/15 2008 02/28/15 0523  NA 138 140  K 5.2* 5.0  CL 88* 87*  CO2 47* 45*  GLUCOSE 216* 198*  BUN 9 14  CREATININE 0.54 0.68  CALCIUM 9.4 9.7   Liver  Function Tests:  Recent Labs Lab 02/27/15 2008  AST 17  ALT 14  ALKPHOS 88  BILITOT 0.3  PROT 7.4  ALBUMIN 3.2*   No results for input(s): LIPASE, AMYLASE in the last 168 hours. No results for input(s): AMMONIA in the last 168 hours. CBC:  Recent Labs Lab 02/27/15 2008 02/28/15 0523  WBC 11.2* 12.2*  NEUTROABS 7.2  --   HGB 11.6* 12.4  HCT 42.1 44.5  MCV 108.5* 107.5*  PLT 271 286   Cardiac Enzymes: No results for input(s): CKTOTAL, CKMB, CKMBINDEX, TROPONINI in the last 168 hours. BNP: Invalid input(s): POCBNP CBG:  Recent Labs Lab 02/28/15 0337 02/28/15 0809  GLUCAP 173* 173*    Recent Results (from the past 240 hour(s))  MRSA PCR Screening     Status: Abnormal   Collection Time: 02/28/15  4:50 AM  Result Value Ref Range Status   MRSA by PCR POSITIVE (A) NEGATIVE Final    Comment:        The GeneXpert MRSA Assay (FDA approved for NASAL specimens only), is one component of a comprehensive MRSA colonization surveillance program. It is not intended to diagnose MRSA infection nor to guide or monitor treatment for MRSA infections. RESULT CALLED TO, READ BACK BY AND VERIFIED WITH:  Judie Petit YORK  02/28/15 MKELLY      Studies:              All Imaging reviewed and is as per above notation   Scheduled Meds: . albuterol  2.5 mg Nebulization Q4H  . aspirin EC  81 mg Oral Daily  . budesonide  0.5 mg Nebulization BID  . carvedilol  3.125 mg Oral Daily  . cetaphil   Topical BID  . [START ON 03/01/2015] Chlorhexidine Gluconate Cloth  6 each Topical Q0600  . enoxaparin (LOVENOX) injection  40 mg Subcutaneous Q24H  . insulin aspart  0-5 Units Subcutaneous QHS  . insulin aspart  0-9 Units Subcutaneous TID WC  . loratadine  10 mg Oral Daily  . mupirocin ointment  1 application Nasal BID  . pantoprazole  40 mg Oral Q1200  . pravastatin  20 mg Oral q1800  . predniSONE  60 mg Oral Q breakfast   Continuous Infusions:    Assessment/Plan:   1. Acute hypoxic  respiratory failure-probable component of anxiety with underlying acute exacerbation of chronic lung disease with chronic CO2 retention (asthma/COPD). Patient doing reasonably well off BiPAP without significant work of breathing we will monitor. Continue prednisone 60. Asa manifestation of need for antibiotics-appreciate pulmonology input. Pro calcitonin 0.14 2. Chronic bimodal heart failure-given 1 dose Lasix in the emergency room. Echocardiogram is pending. Continue aspirin 81 as well as as: Coreg 3.125 although this may be relatively contraindicated given asthma? 3. Atrial fibrillation [place] score 4-unclear why not on Coumadin and "she states" I don't know" when i enquire. Rate controlled, continue beta blocker as above-uptitratre if rate uncontrolled in 24 hrs 4. Diabetes mellitus not on meds?-Follow A1c, blood sugars ranging 173-198 on carb modified diet. Expect that sugars to be high based on steroid use 5. GERD continue pantoprazole 40 twice a day 6. Hyperlipidemia continue Pravachol 20 daily at bedtime   Code Status: Full CODE STATUS Family Communication: No family present Disposition Plan: Will be staying in the hospital for now-if stabilizes and no further increased work of breathing can discharge in 1-2 days if stable    Pleas Koch, MD  [facility] Pager [phone] 02/28/2015, 10:09 AM    LOS: 0 days

## 2015-02-28 NOTE — Consult Note (Signed)
Name: [name] MRN: [id number] DOB: 1950/05/22    ADMISSION DATE:  02/27/2015 CONSULTATION DATE:  02/28/15  REFERRING MD :  EDP  CHIEF COMPLAINT:  SOB  BRIEF PATIENT DESCRIPTION: 64 y.o. female  [facility] with SOB and hypersomnolence.  ABG with hypercarbic and hypoxic respiratory failure.  Pt started on BiPAP and PCCM called for further recs.  SIGNIFICANT EVENTS  11/25 - admitted with AoC hypercarbic and hypoxic respiratory failure.  STUDIES:  CXR 11/24 > cardiac enlargement.   HISTORY OF PRESENT ILLNESS:  Brandy StainCora E [name] is a 64 y.o. female with multiple co-morbidities including but not limited to chronic hypoxic respiratory failure (O2 dependent 24/7), COPD, OSA/OHS, non-compliance with nocturnal BiPAP, CHF, bilateral ankle fx's causing her to be bed bound, FTT.  [facility] for SOB and increased somnolence.   On EMS arrival, she was found to be hypoxic.  She was placed on supplemental O2 and transported to ED.  In ED, ABG revealed hypercarbic and hypoxic respiratory failure (7.19 / > 100 / 74).   She was placed on BiPAP (16/6) and repeat ABG 1-2 hours later showed minimal improvement.  PCCM was called to evaluate pt further.  On my exam, pt was wide awake, able to answer all questions and participate in exam.  BiPAP settings were changed to (20/5) which she reported was comfortable.  We discussed code status and she was adamant that she would not want to be intubated.  Of note, she had recent admission 01/03/15 through 01/09/15 for acute hypercarbic respiratory failure.  She was treated with BiPAP last admission and discharged with instructions to continue with nocturnal BiPAP and have PT/OT/RN/aide assistance due to her FTT.  During that admission, she mentioned to our team that she would never want to be intubated again (she reportedly has been intubated 5 times in the past).  She was actually made a DNR on that admission.  She does wish for CPR / ACLS  in the event of cardiac arrest, but again, is adamant against intubation.   PAST MEDICAL HISTORY :   has a past medical history of Asthma; Obesity hypoventilation syndrome (HCC); OSA (obstructive sleep apnea); Diastolic CHF (HCC); Physical deconditioning; HLD (hyperlipidemia); COPD (chronic obstructive pulmonary disease) (HCC); Dysrhythmia; Diabetes mellitus without complication (HCC); Anxiety; and Chronic respiratory failure (HCC).  has past surgical history that includes Cesarean section and Ankle fusion (Left, 01/18/2014). Prior to Admission medications   Medication Sig Start Date End Date Taking? Authorizing Provider  acetaminophen (TYLENOL) 500 MG tablet Take 1,000 mg by mouth every 6 (six) hours as needed for mild pain.     Historical Provider, MD  albuterol (PROVENTIL HFA;VENTOLIN HFA) 108 (90 BASE) MCG/ACT inhaler Inhale 2 puffs into the lungs every 6 (six) hours as needed for wheezing or shortness of breath. 01/09/15   Bernadene PersonKathryn A Whiteheart, NP  aspirin EC 81 MG tablet Take 81 mg by mouth daily.    Historical Provider, MD  budesonide (PULMICORT) 0.5 MG/2ML nebulizer solution Take 2 mLs (0.5 mg total) by nebulization 2 (two) times daily. 01/09/15   Bernadene PersonKathryn A Whiteheart, NP  carvedilol (COREG) 3.125 MG tablet Take 3.125 mg by mouth daily.    Historical Provider, MD  guaiFENesin (MUCINEX) 600 MG 12 hr tablet Take 2 tablets (1,200 mg total) by mouth 2 (two) times daily. Patient not taking: Reported on 01/03/2015 12/03/13   Zannie CovePreetha Joseph, MD  ipratropium-albuterol (DUONEB) 0.5-2.5 (3) MG/3ML SOLN Take 3 mLs by  nebulization every 6 (six) hours. 12/03/13   Zannie Cove, MD  loratadine (CLARITIN) 10 MG tablet Take 1 tablet (10 mg total) by mouth daily. 01/09/15   Bernadene Person, NP  pantoprazole (PROTONIX) 40 MG tablet Take 1 tablet (40 mg total) by mouth daily at 12 noon. 12/03/13   Zannie Cove, MD  pravastatin (PRAVACHOL) 20 MG tablet Take 20 mg by mouth daily at 6 PM.     Historical  Provider, MD  predniSONE (DELTASONE) 10 MG tablet 3 tabs PO daily x 3 days, then 2 tabs PO daily x 3 days, then 1 tab PO daily x 3 days, then STOP. 01/09/15   Bernadene Person, NP  Vitamin D, Ergocalciferol, (DRISDOL) 50000 UNITS CAPS capsule Take 50,000 Units by mouth 2 (two) times a week.    Historical Provider, MD   Allergies  Allergen Reactions  . Codeine Nausea And Vomiting  . Fentanyl Rash    patches    FAMILY HISTORY:  family history includes COPD in her father. SOCIAL HISTORY:  reports that she quit smoking about 21 years ago. Her smoking use included Cigarettes. She has a 30 pack-year smoking history. She has never used smokeless tobacco. She reports that she does not drink alcohol or use illicit drugs.  REVIEW OF SYSTEMS:   All negative; except for those that are bolded, which indicate positives.  Constitutional: weight loss, weight gain, night sweats, fevers, chills, fatigue, weakness.  HEENT: headaches, sore throat, sneezing, nasal congestion, post nasal drip, difficulty swallowing, tooth/dental problems, visual complaints, visual changes, ear aches. Neuro: difficulty with speech, weakness, numbness, ataxia. CV:  chest pain, orthopnea, PND, swelling in lower extremities, dizziness, palpitations, syncope.  Resp: cough, hemoptysis, dyspnea, wheezing. GI  heartburn, indigestion, abdominal pain, nausea, vomiting, diarrhea, constipation, change in bowel habits, loss of appetite, hematemesis, melena, hematochezia.  GU: dysuria, change in color of urine, urgency or frequency, flank pain, hematuria. MSK: joint pain or swelling, decreased range of motion. Psych: change in mood or affect, depression, anxiety, suicidal ideations, homicidal ideations. Skin: rash, itching, bruising.    SUBJECTIVE:  She states her breathing is certainly better from earlier.  Denies fevers/chills/sweats, chest pain, wheezing, cough, N/V/D, abd pain.  VITAL SIGNS: Temp:  [97.9 F (36.6 C)] 97.9 F  (36.6 C) (11/24 2000) Pulse Rate:  [80-102] 94 (11/25 0015) Resp:  [13-22] 22 (11/24 2335) BP: (99-154)/(44-101) 123/70 mmHg (11/25 0015) SpO2:  [90 %-100 %] 99 % (11/25 0015) FiO2 (%):  [40 %] 40 % (11/24 2110)  PHYSICAL EXAMINATION: General: Chronically ill appearing female, resting in stretcher, in NAD. Neuro: A&O x 3, non-focal.  HEENT: [place]/AT. PERRL, sclerae anicteric. Cardiovascular: RRR, no M/R/G.  Lungs: Respirations shallow.  BS diminished.  No W/R/R appreciated. Abdomen: Obese, BS hypoactive, soft, NT/ND.  Musculoskeletal: No gross deformities, no edema.  Skin: Intact, warm, no rashes.   Recent Labs Lab 02/27/15 2008  NA 138  K 5.2*  CL 88*  CO2 47*  BUN 9  CREATININE 0.54  GLUCOSE 216*    Recent Labs Lab 02/27/15 2008  HGB 11.6*  HCT 42.1  WBC 11.2*  PLT 271   Dg Chest Port 1 View  02/27/2015  CLINICAL DATA:  Shortness of Breath EXAM: PORTABLE CHEST 1 VIEW COMPARISON:  January 05, 2015 FINDINGS: There is no edema or consolidation. Heart is borderline enlarged with pulmonary vascular within normal limits. No adenopathy. No bone lesions. IMPRESSION: Borderline cardiac enlargement.  No edema or consolidation. Electronically Signed   By: Chrissie Noa  Margarita Grizzle III M.D.   On: 02/27/2015 20:26    ASSESSMENT / PLAN:  Acute on chronic hypoxic and hypercarbic respiratory failure. OSA / OHS - supposed to use nocturnal BiPAP but is non-compliant. COPD without exacerbation. Plan: Continue BiPAP tonight (settings adjusted). Would not follow serial ABG's unless pt deteriorates clinically (she is a chronic CO2 retainer). Supplemental O2 once off BiPAP to maintain SpO2 88 - 92%. Budesonide / DuoNebs / Albuterol. Lasix  x 1. No role steroids. Defer abx for now. Check PCT, if high then will start abx.  Rest per primary team.   Rutherford Guys, PA - C [place] [facility] Pager: [phone]  or [phone] 02/28/2015, 12:37  AM

## 2015-02-28 NOTE — Progress Notes (Signed)
Echocardiogram 2D Echocardiogram has been performed.  Dorothey BasemanReel, Andy Allende M 02/28/2015, 10:40 AM

## 2015-02-28 NOTE — Progress Notes (Signed)
ANTICOAGULATION CONSULT NOTE - Initial Consult  Pharmacy Consult for Xarelto Indication: atrial fibrillation  Allergies  Allergen Reactions  . Codeine Nausea And Vomiting  . Fentanyl Rash    patches    Patient Measurements: Height: 5\' 5"  (165.1 cm) Weight: 222 lb 3.6 oz (100.8 kg) IBW/kg (Calculated) : 57  Vital Signs: Temp: 98.9 F (37.2 C) (11/25 1151) Temp Source: Oral (11/25 1151) BP: 128/73 mmHg (11/25 1100) Pulse Rate: 104 (11/25 1100)  Labs:  Recent Labs  02/27/15 2008 02/28/15 0523  HGB 11.6* 12.4  HCT 42.1 44.5  PLT 271 286  CREATININE 0.54 0.68    Estimated Creatinine Clearance: 82.5 mL/min (by C-G formula based on Cr of 0.68).   Medical History: Past Medical History  Diagnosis Date  . Asthma     with exaerbation and admission in 5/12. no h/ intubation. Is also suspected to have some degree of restrictive pattern and moderate emphysema  (per Dr. Teddy Spikelance's note0 and h/o smoking in past.   . Obesity hypoventilation syndrome (HCC)     followed with Dr. Stanton KidneyKieth Clance ([facility]) once in 2012.   Marland Kitchen. OSA (obstructive sleep apnea)     on CPAP qhs. Sleep study in 2009 confirmed. Not very compliant with CPAP  . Diastolic CHF (HCC)     Echo 2009 confirmed (poor acoustic window) , EF 55%,, mild RVH  . Physical deconditioning     wheelchair bound, unable to perforrm ADLs at home  . HLD (hyperlipidemia)   . COPD (chronic obstructive pulmonary disease) (HCC)     uses O2 4L   . Dysrhythmia     atrial fib  . Diabetes mellitus without complication (HCC)   . Anxiety   . Chronic respiratory failure (HCC)     Medications:  Prescriptions prior to admission  Medication Sig Dispense Refill Last Dose  . acetaminophen (TYLENOL) 500 MG tablet Take 1,000 mg by mouth every 6 (six) hours as needed for mild pain.    unknown at Unknown time  . albuterol (PROVENTIL HFA;VENTOLIN HFA) 108 (90 BASE) MCG/ACT inhaler Inhale 2 puffs into the lungs every 6 (six) hours as needed for  wheezing or shortness of breath. 1 Inhaler 0   . aspirin EC 81 MG tablet Take 81 mg by mouth daily.   01/02/2015 at Unknown time  . budesonide (PULMICORT) 0.5 MG/2ML nebulizer solution Take 2 mLs (0.5 mg total) by nebulization 2 (two) times daily. 60 mL 0   . carvedilol (COREG) 3.125 MG tablet Take 3.125 mg by mouth daily.   01/02/2015 at 800  . guaiFENesin (MUCINEX) 600 MG 12 hr tablet Take 2 tablets (1,200 mg total) by mouth 2 (two) times daily. (Patient not taking: Reported on 01/03/2015)   Not Taking at Unknown time  . ipratropium-albuterol (DUONEB) 0.5-2.5 (3) MG/3ML SOLN Take 3 mLs by nebulization every 6 (six) hours.   01/02/2015 at Unknown time  . loratadine (CLARITIN) 10 MG tablet Take 1 tablet (10 mg total) by mouth daily.     . pantoprazole (PROTONIX) 40 MG tablet Take 1 tablet (40 mg total) by mouth daily at 12 noon.   01/02/2015 at Unknown time  . pravastatin (PRAVACHOL) 20 MG tablet Take 20 mg by mouth daily at 6 PM.    01/02/2015 at Unknown time  . Vitamin D, Ergocalciferol, (DRISDOL) 50000 UNITS CAPS capsule Take 50,000 Units by mouth 2 (two) times a week.   Past Week at Unknown time    Assessment: 64 y/o female admitted with SOB  with known COPD and chronic OSA and OHS. Pharmacy consulted to begin Xarelto for Afib. CHADS2VASC is 4 and was not on anticoagulation PTA. No bleeding noted, CBC is stable.  Goal of Therapy:  therapeutic anticoagulation Monitor platelets by anticoagulation protocol: Yes   Plan:  - Xarelto 20 mg PO qdws - Discontinue Lovenox - Monitor for s/sx of bleeding  [facility], [address].D., BCPS Clinical Pharmacist Pager: [phone] 02/28/2015 1:25 PM

## 2015-02-28 NOTE — Progress Notes (Signed)
Patient is off BIPAP and on 3 LPM nasal cannula. No distress noted, speaking in complete sentences. SPO2 96% BBS Diminished. RT will continue to monitor.

## 2015-02-28 NOTE — Progress Notes (Signed)
Notice just now BiPAP settings were changed per MD. Settings were changed a couple of hours ago. Pt tolerates it well. Pt is getting ready to be admitted in just a few moments.

## 2015-02-28 NOTE — Evaluation (Signed)
Physical Therapy Evaluation Patient Details Name: [name] MRN: [id number] DOB: 05-24-1950 Today's Date: 02/28/2015   History of Present Illness  Pt is a 64 y/o F admitted w/ acute hypoxic respiratory failure.  Pt's PMH includes asthma, obesity hypoventilation syndrome, diastolic CHF, physical deconditioning, dysrhythmia, COPD, anxiety.  Clinical Impression  Pt admitted with above diagnosis. Pt currently with functional limitations due to the deficits listed below (see PT Problem List). PTA [name] receives 24/7 assist from her son for all ADLs and was receiving HHPT w/ focus on practicing sliding board transfer to/from [facility].  Otherwise, pt remains in bed throughout day.  She is anticipating returning home at d/c w/ 24/7 assist from her son, with goal of acquiring [facility] aide to relieve her son's duties.  Limited session to sitting EOB today as she reports she needs to be wearing bil CAM boots in order to attempt transfer to chair.  Pt will benefit from skilled PT to increase their independence and safety with mobility to allow discharge to the venue listed below.      Follow Up Recommendations Home health PT;Other (comment);Supervision/Assistance - 24 hour (Maximize HH services)    Equipment Recommendations  None recommended by PT    Recommendations for Other Services       Precautions / Restrictions Precautions Precautions: Fall Precaution Comments: Per pt, non-ambulatory PTA Required Braces or Orthoses: Other Brace/Splint Other Brace/Splint: per pt she wears Bil CAM boots when transfering to WC w/ HHPT      Mobility  Bed Mobility Overal bed mobility: Needs Assistance;+2 for physical assistance Bed Mobility: Supine to Sit;Sit to Supine     Supine to sit: Mod assist;+2 for physical assistance;HOB elevated Sit to supine: +2 for physical assistance;Mod assist   General bed mobility comments: Assist managing Bil LEs and using bed pad for positioning hips during supine<>sit.   Cues for technique and use of bed rail to push up.  Transfers                 General transfer comment: Pt reports she cannot get to the chair w/o Bil CAM boots, which aren't at the hospital.  Pt reports her MD told her not to bring them to the hospital w/ her   Ambulation/Gait                Stairs            Wheelchair Mobility    Modified Rankin (Stroke Patients Only)       Balance Overall balance assessment: Needs assistance Sitting-balance support: Bilateral upper extremity supported Sitting balance-Leahy Scale: Fair Sitting balance - Comments: Initially support provided to trunk posteriorly.  Sits EOB ~15 mins w/ min guard. Pt becomes SOB (SpO2 remains in high 90's throughout session).  Pt anxious about SOB and reports, "I have asthma, it will take me 15 minutes to catch my breath"                                     Pertinent Vitals/Pain Pain Assessment: Faces Faces Pain Scale: Hurts even more Pain Location: Lt foot/ankle Pain Descriptors / Indicators: Grimacing;Guarding;Constant Pain Intervention(s): Limited activity within patient's tolerance;Monitored during session;Repositioned    Home Living Family/patient expects to be discharged to:: Private residence Living Arrangements: Children Available Help at Discharge: Family;Available 24 hours/day Type of Home: House Home Access: Ramped entrance;Stairs to enter Entrance Stairs-Rails: None Entrance Stairs-Number of Steps:  1 Home Layout: One level Home Equipment: Bedside commode;Wheelchair - manual;Hospital bed Additional Comments: Unsure of reliability of information provided by pt. Pt says she has a ramp to enter her home but then says her son bumps her up one step to get inside using WC.    Prior Function Level of Independence: Needs assistance   Gait / Transfers Assistance Needed: Does not ambulate.  Reports she has been practicing sliding board transfer w/ HHPT to WC and then  back to bed. Denies using WC to move around home.  ADL's / Homemaking Assistance Needed: Son assist pt w/ all ADLs        Hand Dominance   Dominant Hand: Right    Extremity/Trunk Assessment   Upper Extremity Assessment: Generalized weakness           Lower Extremity Assessment: RLE deficits/detail;LLE deficits/detail RLE Deficits / Details: s/p ankle fx ~1 year ago w/ poor healing and residual pain LLE Deficits / Details: s/p ankle fx ~1 year ago w/ poor healing and residual pain  Cervical / Trunk Assessment: Kyphotic  Communication   Communication: No difficulties  Cognition Arousal/Alertness: Awake/alert Behavior During Therapy: Agitated;WFL for tasks assessed/performed Overall Cognitive Status: No family/caregiver present to determine baseline cognitive functioning       Memory: Decreased short-term memory (Pt says, "I have short-term memory loss", consistent w/ eval)              General Comments General comments (skin integrity, edema, etc.): [facility] as her son is bipolar and has difficulty taking care of his mother 24/7    Exercises General Exercises - Lower Extremity Ankle Circles/Pumps: AROM;10 reps;Right;Seated (Pt states she is unable to move Lt ankle 2/2 surgical histor) Long Arc Quad: AROM;Both;10 reps;Seated;Limitations Long [employer] Quad Limitations: Pt reports she cannot do any additional exercise sitting EOB because she will become too SOB      Assessment/Plan    PT Assessment Patient needs continued PT services  PT Diagnosis Difficulty walking;Generalized weakness;Acute pain   PT Problem List Decreased strength;Decreased range of motion;Decreased activity tolerance;Decreased balance;Decreased mobility;Decreased cognition;Decreased knowledge of use of DME;Decreased safety awareness;Decreased knowledge of precautions;Cardiopulmonary status limiting activity;Pain  PT Treatment Interventions DME instruction;Functional  mobility training;Therapeutic activities;Therapeutic exercise;Balance training;Neuromuscular re-education;Cognitive remediation;Patient/family education;Wheelchair mobility training   PT Goals (Current goals can be found in the Care Plan section) Acute Rehab PT Goals Patient Stated Goal: to go home  PT Goal Formulation: With patient Time For Goal Achievement: 03/21/15 Potential to Achieve Goals: Fair    Frequency Min 3X/week   Barriers to discharge Inaccessible home environment;Decreased caregiver support Pt's son able to provide 24/7 assist but she [facility] so he does not have to do so    Co-evaluation               End of Session Equipment Utilized During Treatment: Oxygen Activity Tolerance: Other (comment);Patient limited by fatigue;Patient limited by pain (Pt very anxious about becoming SOB) Patient left: in bed;with call bell/phone within reach;with bed alarm set Nurse Communication: Mobility status;Precautions;Need for lift equipment         Time: 1343-1409 PT Time Calculation (min) (ACUTE ONLY): 26 min   Charges:   PT Evaluation $Initial PT Evaluation Tier I: 1 Procedure PT Treatments $Therapeutic Activity: 8-22 mins   PT G Codes:       Michail JewelsAshley Parr PT, DPT [phone] Pager: [phone] 02/28/2015, 3:31 PM

## 2015-02-28 NOTE — H&P (Signed)
History and Physical  Patient Name: [name]     UJW:[id number]    DOB: [birth year]    DOA: 02/27/2015 Referring physician: Emily Filbertoug Delo, MD PCP: Brandy LorBarr, Julie, NP      Chief Complaint: Somnolence and dyspnea  HPI: [name] is a 64 y.o. female with a past medical history significant for chronic respiratory failure from asthma/COPD on home O2, OSA and OHS on nightly BiPAP, chronic diastolic and systolic CHF, atrial fibrillation not on warfarin, NIDDM and being bed-bound who presents with acute on chronic dyspnea and somnolence.  History is collected from the patient who is a reliable historian and her son, who is judged not to be a reliable historian.    She tells me that she has had worsening dyspnea over the last 3 weeks because of "my house being so dusty and my allergies" which she is trying to get taken care of.  She denies fever, URI symptoms.  Her son adds that he doesn't think her nebulizer capsules have been working or had been getting clogged (he has not clear) and that today she started repeating herself, being in a daze, and sleeping more than usual, so he called EMS.  EMS found the patient hypoxic in the 70s and struggling to breathe and gave albuterol, Atrovent, and 125 mg of Solu-Medrol. In the ED, the patient was somnolent and had an initial ABG 7.19 pCO2 >100 pO2 74 and was placed on BiPAP.  Her BNP was completely normal. A chest x-ray showed no focal opacity. The patient was evaluated by PCCM who recommended hospitalist admission after the patient reiterated her refusal of intubation.     Review of Systems:  Pt complains of dyspnea, increased work of breathing, air hunger, itching, dry throat. Pt denies any fever, chills, sputum, leg swelling, orthopnea.  All other systems negative except as just noted or noted in the history of present illness.   Allergies:  Allergies  Allergen Reactions  . Codeine Nausea And Vomiting  . Fentanyl Rash    patches    Home  medications: 1. Albuterol 2. Budesonide twice daily 3. DuoNeb as needed 4. Aspirin 81 mg daily 5. Carvedilol 3.125 mg daily 6. Loratadine 10 mg daily 7. Pantoprazole 40 mg daily 8. Pravastatin 20 mg daily  Past medical history: 1. OSA on nightly BiPAP, questionable compliance 2. OHS 3. COPD/asthma on 3 L home oxygen 4. Paroxysmal Atrial fibrillation  Previous notes state that the patient has "felt high risk for anticoagulation" for unclear reasons 5. Type 2 diabetes 6. Chronic diastolic and systolic congestive heart failure last EF 40-45% in 2015 7. Morbid obesity   Past surgical history: 1. Ankle fusion in October 2015 2. Cesarean section  Family history:  Father, COPD.   Social History:  Patient lives with her son [name]. She is a former smoker. She is bedbound and last year because of her ankles and because of general deconditioning.        Physical Exam: BP 95/71 mmHg  Pulse 98  Temp(Src) 97.9 F (36.6 C) (Oral)  Resp 18  SpO2 98% General appearance: Overweight adult female, alert and in marked distress from dyspnea with BiPAP off, comfortable with it on.   Eyes: Anicteric, conjunctiva pink, lids and lashes normal.     ENT: No nasal deformity, discharge.  OP moist without lesions.  Edentulous. Lymph: No cervical lymphadenopathy. Skin: Warm. Marked xerosis and eczematous thickening and excoriations on back, chest, and flexor parts of arms and legs.  Cardiac: Tachycardic, no murmurs appreciated.  No distended neck veins.  No LE edema.  Respiratory: Without BiPAP, patient has subcostal retractions and struggles to breathe and complains of shortness of breath.  She has poor air movement, without focal crackles or wheezes. Abdomen: Abdomen soft without rigidity.  No TTP. No ascites, distension.   Neuro: Sensorium intact and responding to questions, attention normal.  Speech is fluent.  Moves all extremities equally and with normal coordination.    Psych: Behavior  appropriate.  Affect normal.  No evidence of aural or visual hallucinations or delusions.       Labs on Admission:  The metabolic panel shows hyperkalemia, normal sodium. The bicarbonate is 47 mmol/L.  Her baseline bicarb is 41 mmol/L, suggesting a baseline pCO2 around .  Hyperglycemia. Normal renal function. The transaminases and bilirubin are normal. The BNP was 9 pg per mL. Troponin normal. The complete blood count shows mild leukocytosis, chronic normocytic anemia.   Radiological Exams on Admission: Personally reviewed: Dg Chest Port 1 View  02/27/2015  CLINICAL DATA:  Shortness of Breath EXAM: PORTABLE CHEST 1 VIEW COMPARISON:  January 05, 2015 FINDINGS: There is no edema or consolidation. Heart is borderline enlarged with pulmonary vascular within normal limits. No adenopathy. No bone lesions. IMPRESSION: Borderline cardiac enlargement.  No edema or consolidation. Electronically Signed   By: Bretta Bang III M.D.   On: 02/27/2015 20:26    EKG: Independently reviewed. Sinus tachycardia without ST changes.    Assessment/Plan 1. Acute on chronic respiratory failure with Hypercapnia and hypoxia in setting of chronic COPD, OHS, and OSA:  -Admit to Stepdown -BiPAP overnight and repeat ABG tomorrow -Goal SpO2 88-92% -Nebulized bronchodilators -Prednisone 60 mg PO daily  -Procalcitonin per Pulm   2. Chronic diastolic and systolic CHF:  Last EF was poorly visualized at 40%, over 1 year ago.  Somewhat congested CXR perhaps, but low lung volumes, and BNP very low. Got one dose furosemide in ER.  Hold further.  Not on ACEi, unclear reason.   -Repeat echocardiogram -Continue aspirin and BB  3. Atrial fibrillation:  CHADS2Vasc 4.  Not on warfarin, unclear reason. -Continue BB  4. NIDDM:  Hyperglycemic at admission.  No home antidiabetics in our list.  -Check HgbA1c -Sliding scale corrections  5. Ankle fractures, bed bound and deconditioned:  Stable.  -PT  consultation  6. Allergic rhinitis and eczema:  Stable.  -Prednisone -Cetaphil twice daily      DVT PPx: Lovenox Diet: Carb reduced and heart healthy Consultants: CM, PT, RT Code Status: DO NOT RESUSCITATE; The patient was clear to me (and this contradicts what is documented in Pulm note) that she does not want intubation AND she believes chest compressions would "kill me" and therefore does not want them.  I concur, verified again that she does not want shocks or compressions in addition to not wanting intubation, and have made her DNR, consistent with her previous admission.   Family Communication: Spoke to son Brandy Lang by phone.    Medical decision making: What exists of the patient's previous chart was reviewed in depth and the case was discussed with Dr. Judd Lien. Patient seen 1:18 AM on 02/28/2015.  Disposition Plan:  Admit to step down for BiPAP.  When mental status improves, could wean BiPAP.  Follow procalcitonin and monitor.  Repeat echocardiogram.  Expect hospitalization 4-5 days.     Alberteen Sam [facility] Pager [phone]

## 2015-02-28 NOTE — Progress Notes (Signed)
Morning ABG reported to pt. RN. RN to notify MD.

## 2015-02-28 NOTE — Discharge Instructions (Signed)

## 2015-03-01 LAB — GLUCOSE, CAPILLARY
GLUCOSE-CAPILLARY: 88 mg/dL (ref 65–99)
Glucose-Capillary: 146 mg/dL — ABNORMAL HIGH (ref 65–99)

## 2015-03-01 LAB — HEMOGLOBIN A1C
HEMOGLOBIN A1C: 6 % — AB (ref 4.8–5.6)
Mean Plasma Glucose: 126 mg/dL

## 2015-03-01 MED ORDER — RIVAROXABAN 20 MG PO TABS: 20.0000 mg | ORAL_TABLET | Freq: Every day | ORAL | Status: AC

## 2015-03-01 MED ORDER — PREDNISONE 20 MG PO TABS: 60.0000 mg | ORAL_TABLET | Freq: Every day | ORAL | Status: AC

## 2015-03-01 NOTE — Progress Notes (Signed)
Patient to discharge home via transport with [facility].  Discharge instructions reviewed at bedside all questions answered at this time.  Pt. Prescriptions have been called in to local pharmacy patients son will be picking them up today.  Thirty day free trial of Xarelto coupon given to patient with discharge instructions.  Pt. Discharged with home oxygen already set up at house.  Pt. VSS with no s/s of distress noted.  Patient stable at discharge.

## 2015-03-01 NOTE — Discharge Summary (Signed)
Physician Discharge Summary  [name] ZOX:[id number] DOB: February 15, 1951 DOA: 02/27/2015  PCP: Marletta Lor, NP  Admit date: 02/27/2015 Discharge date: 03/01/2015  Time spent: 35 minutes  Recommendations for Outpatient Follow-up:  1. Recommend outpatient close follow-up with nurse practitioner who comes to her home 2. Home health therapy will be ordered to see the patient and home health RN to come out and assist with breathing treatments and monitoring of device etc. etc. 3. Patient has been started on this admission on Xarelto for secondary prevention of stroke in A. fib 4. Patient will need a burst of prednisone 60 mg for 5 days and we will need to see how the patient subsequently  Discharge Diagnoses:  Active Problems:   OBESITY, MORBID   Acute on chronic respiratory failure (HCC)   Obesity hypoventilation syndrome (HCC)   COPD exacerbation (HCC)   Atrial fibrillation (HCC)   DM II (diabetes mellitus, type II), controlled (HCC)   Acute on chronic respiratory failure with hypoxia and hypercapnia (HCC)   Discharge Condition: Improved  Diet recommendation: Heart healthy low-salt  Filed Weights   02/28/15 0339 03/01/15 0422  Weight: 100.8 kg (222 lb 3.6 oz) 103.2 kg (227 lb 8.2 oz)    History of present illness:  64 y/o ? recent admission 9/30-10/6/16 OSA/OHSS on Bipap COPD ? 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   Admitted from home with acute episodic shortness of breath after Thanksgiving dinner-has significant anxiety and keep stating "I need a nerve pill" Taken off BiPAP while I was in the room and seemed to maintain sats above 90% here although with rapid talking and pressured speech  drop to [facility] Course:    1. Acute hypoxic respiratory failure-probable component of anxiety with underlying acute exacerbation of chronic lung disease with chronic CO2 retention (asthma/COPD). Patient did well off BiPAP without significant work of breathing we will monitor. Continue prednisone 60 until 03/06/15.  Pro calcitonin 0.14 2. Chronic bimodal heart failure-given 1 dose Lasix in the emergency room. Echocardiogram is pending. Continue aspirin 81 as well as as: Coreg 3.125 it was increased to this admission to 6.25 given tachycardia.  Patient's breathing completely improved and it was felt that her hypoxic respiratory failure is more in keeping with COPD exacerbation. 3. Atrial fibrillation [place] score 4-unclear why not on anticoagulation. On this admission we started [place]. Rate controlled 4. Diabetes mellitus not on meds?-from this admission A1cA1c 6.0, blood sugars ranging 173-198 on carb modified diet. Expect that sugars to be high based on steroid use 5. GERD continue pantoprazole 40 twice a day 6. Hyperlipidemia continue Pravachol 20 daily at bedtime  Consultations:  pulmonology  Discharge Exam: Filed Vitals:   03/01/15 0422 03/01/15 0700  BP: 115/66   Pulse: 87   Temp: 97.9 F (36.6 C) 98.6 F (37 C)  Resp: 17     General: alert pleasant oriented no apparent distress, passed a reasonable night, explains to me her concerns about her breathing machine and equipment use  Cardiovascular: S1 and S2 no murmur rub or gallop Respiratory: clinically clear with somewhat oriented 3 in the bases however satting well and looks well No lower extremity edema  Discharge Instructions    Current Discharge Medication  List    CONTINUE these medications which have NOT CHANGED   Details  albuterol (PROVENTIL HFA;VENTOLIN HFA) 108 (90 BASE) MCG/ACT inhaler Inhale 2 puffs into the lungs every 6 (six) hours as needed for wheezing or shortness of breath. Qty: 1 Inhaler,  Refills: 0    aspirin EC 81 MG tablet Take 81 mg by mouth daily.    budesonide (PULMICORT) 0.5 MG/2ML nebulizer solution Take 2 mLs (0.5 mg total) by nebulization 2 (two) times daily. Qty: 60 mL, Refills: 0    carvedilol (COREG) 3.125 MG tablet Take 3.125 mg by mouth daily.    diltiazem (DILACOR XR) 180 MG 24 hr capsule Take 180 mg by mouth daily.    loratadine (CLARITIN) 10 MG tablet Take 1 tablet (10 mg total) by mouth daily.    pantoprazole (PROTONIX) 40 MG tablet Take 1 tablet (40 mg total) by mouth daily at 12 noon.    pravastatin (PRAVACHOL) 20 MG tablet Take 20 mg by mouth daily at 6 PM.     spironolactone (ALDACTONE) 25 MG tablet Take 25 mg by mouth daily.    Vitamin D, Ergocalciferol, (DRISDOL) 50000 UNITS CAPS capsule Take 50,000 Units by mouth 2 (two) times a week.       Allergies  Allergen Reactions  . Codeine Nausea And Vomiting  . Fentanyl Rash    patches      The results of significant diagnostics from this hospitalization (including imaging, microbiology, ancillary and laboratory) are listed below for reference.    Significant Diagnostic Studies: Dg Chest Port 1 View  02/27/2015  CLINICAL DATA:  Shortness of Breath EXAM: PORTABLE CHEST 1 VIEW COMPARISON:  January 05, 2015 FINDINGS: There is no edema or consolidation. Heart is borderline enlarged with pulmonary vascular within normal limits. No adenopathy. No bone lesions. IMPRESSION: Borderline cardiac enlargement.  No edema or consolidation. Electronically Signed   By: Bretta BangWilliam  Woodruff III M.D.   On: 02/27/2015 20:26    Microbiology: Recent Results (from the past 240 hour(s))  MRSA PCR Screening     Status: Abnormal   Collection Time: 02/28/15  4:50 AM  Result Value Ref Range Status   MRSA by PCR POSITIVE (A) NEGATIVE Final    Comment:        The GeneXpert MRSA Assay (FDA approved for NASAL specimens only), is one component of a comprehensive MRSA colonization surveillance program. It is  not intended to diagnose MRSA infection nor to guide or monitor treatment for MRSA infections. RESULT CALLED TO, READ BACK BY AND VERIFIED WITH: M YORK @0604  02/28/15 MKELLY      Labs: Basic Metabolic Panel:  Recent Labs Lab 02/27/15 2008 02/28/15 0523  NA 138 140  K 5.2* 5.0  CL 88* 87*  CO2 47* 45*  GLUCOSE 216* 198*  BUN 9 14  CREATININE 0.54 0.68  CALCIUM 9.4 9.7   Liver Function Tests:  Recent Labs Lab 02/27/15 2008  AST 17  ALT 14  ALKPHOS 88  BILITOT 0.3  PROT 7.4  ALBUMIN 3.2*   No results for input(s): LIPASE, AMYLASE in the last 168 hours. No results for input(s): AMMONIA in the last 168 hours. CBC:  Recent Labs Lab 02/27/15 2008 02/28/15 0523  WBC 11.2* 12.2*  NEUTROABS 7.2  --   HGB 11.6* 12.4  HCT 42.1 44.5  MCV 108.5* 107.5*  PLT 271 286   Cardiac Enzymes: No results for input(s): CKTOTAL, CKMB, CKMBINDEX, TROPONINI in the last 168 hours. BNP: BNP (last 3 results)  Recent  Labs  01/03/15 0715 02/27/15 2008  BNP 13.0 9.1    ProBNP (last 3 results) No results for input(s): PROBNP in the last 8760 hours.  CBG:  Recent Labs Lab 02/28/15 0337 02/28/15 0809 02/28/15 1131 02/28/15 1651 02/28/15 2131  GLUCAP 173* 173* 168* 124* 132*       Signed:  Rhetta Mura  [facility] 03/01/2015, 8:30 AM

## 2015-03-01 NOTE — Progress Notes (Signed)
Patient stated she wanted to wear BIPAP tonight. Checked with patient at 2323, said she wanted to try and eat some crackers before bed. To come back later. Attempted at 0200. Patient said she needed to wait because she had to use the restroom. RN aware. RT will continue to attempt.

## 2015-03-01 NOTE — Care Management Note (Addendum)
Case Management Note  Patient Details  Name: Brandy StainCora E Bracy MRN: [id number] Date of Birth: [birth year]  Subjective/Objective:   COPD, afib, DM                 Action/Plan: NCM spoke to pt and gave permission to speak with son, Kirke CorinCameron Scherzer or Netta CedarsChris Holzworth [phone]. Pt lives in the home with Sheria Langameron. Spoke to Hiaw[place]ameron and states he at home all day. Pt had [facility] in the past for [facility] Contacted [facility] rep, [phone]. And they will accept referral. Contacted pt's pharmacy, [facility] and they have Xarelto in stock. They close at 6 pm. Earlie Serverontacted Chris and he will pick up meds before pharmacy close. Pt has a $0 copay.  Expected Discharge Date:  03/01/2015              Expected Discharge Plan:  Home w Home Health Services  In-House Referral:  Clinical Social Work  Discharge planning Services  CM Consult  Post Acute Care Choice:  Home Health Choice offered to:  Adult Children  HH Arranged:  RN, PT, OT, Nurse's Aide HH Agency:  [facility]  Status of Service:  Completed, signed off  Medicare Important Message Given:    Date Medicare IM Given:    Medicare IM give by:    Date Additional Medicare IM Given:    Additional Medicare Important Message give by:     If discussed at Long Length of Stay Meetings, dates discussed:    Additional Comments:  Elliot CousinShavis, Keighley Deckman Ellen, RN 03/01/2015, 2:53 PM

## 2015-03-03 MED FILL — Perflutren Lipid Microsphere IV Susp 1.1 MG/ML: INTRAVENOUS | Qty: 10 | Status: AC

## 2015-04-16 ENCOUNTER — Encounter (HOSPITAL_COMMUNITY): Payer: Self-pay | Admitting: Nurse Practitioner

## 2015-04-16 ENCOUNTER — Emergency Department (HOSPITAL_COMMUNITY)
Admission: EM | Admit: 2015-04-16 | Discharge: 2015-04-16 | Disposition: A | Discharge status: Home / Self Care | Payer: Medicare Other | Attending: Emergency Medicine | Admitting: Emergency Medicine

## 2015-04-16 DIAGNOSIS — G4733 Obstructive sleep apnea (adult) (pediatric): Secondary | ICD-10-CM | POA: Diagnosis not present

## 2015-04-16 DIAGNOSIS — Z87891 Personal history of nicotine dependence: Secondary | ICD-10-CM | POA: Insufficient documentation

## 2015-04-16 DIAGNOSIS — Z7982 Long term (current) use of aspirin: Secondary | ICD-10-CM | POA: Diagnosis not present

## 2015-04-16 DIAGNOSIS — E119 Type 2 diabetes mellitus without complications: Secondary | ICD-10-CM | POA: Insufficient documentation

## 2015-04-16 DIAGNOSIS — F419 Anxiety disorder, unspecified: Secondary | ICD-10-CM | POA: Diagnosis not present

## 2015-04-16 DIAGNOSIS — Z9981 Dependence on supplemental oxygen: Secondary | ICD-10-CM | POA: Insufficient documentation

## 2015-04-16 DIAGNOSIS — L259 Unspecified contact dermatitis, unspecified cause: Secondary | ICD-10-CM | POA: Insufficient documentation

## 2015-04-16 DIAGNOSIS — I5030 Unspecified diastolic (congestive) heart failure: Secondary | ICD-10-CM | POA: Insufficient documentation

## 2015-04-16 DIAGNOSIS — J449 Chronic obstructive pulmonary disease, unspecified: Secondary | ICD-10-CM | POA: Diagnosis not present

## 2015-04-16 DIAGNOSIS — L309 Dermatitis, unspecified: Secondary | ICD-10-CM

## 2015-04-16 DIAGNOSIS — R21 Rash and other nonspecific skin eruption: Secondary | ICD-10-CM | POA: Diagnosis present

## 2015-04-16 DIAGNOSIS — Z79899 Other long term (current) drug therapy: Secondary | ICD-10-CM | POA: Insufficient documentation

## 2015-04-16 MED ORDER — DEXAMETHASONE SODIUM PHOSPHATE 10 MG/ML IJ SOLN
10.0000 mg | Freq: Once | INTRAMUSCULAR | Status: AC
  Administered 2015-04-16: 10 mg via INTRAMUSCULAR
  Filled 2015-04-16: qty 1

## 2015-04-16 MED ORDER — HYDROXYZINE HCL 25 MG PO TABS
25.0000 mg | ORAL_TABLET | Freq: Once | ORAL | Status: AC
  Administered 2015-04-16: 25 mg via ORAL
  Filled 2015-04-16: qty 1

## 2015-04-16 MED ORDER — DESONIDE 0.05 % EX LOTN: TOPICAL_LOTION | Freq: Two times a day (BID) | CUTANEOUS | Status: AC

## 2015-04-16 MED ORDER — HYDROXYZINE HCL 25 MG PO TABS: 25.0000 mg | ORAL_TABLET | Freq: Four times a day (QID) | ORAL | Status: AC | PRN

## 2015-04-16 NOTE — ED Notes (Signed)
[facility] called to get pt and transfer home.

## 2015-04-16 NOTE — ED Notes (Signed)
Per EMS pt from home to be evaluated for dry skin on elbows and itching on bilateral upper arms. Patient has seen PCP for this in past and advised to go to dermatologist which she did not follow-up. Home health staff saw patient and sts she should go to ED incase it is contagious. No drainage, pain, redness noted. Small bumps and dry sking to left elbow. No open wounds. Patient endorses dry skin is itchy. Has tried creme rx to her by PCP with no relief.

## 2015-04-16 NOTE — ED Notes (Signed)
Pt is in stable condition upon d/c and is escorted home via [facility]. 

## 2015-04-16 NOTE — ED Provider Notes (Signed)
CSN: [id number]     Arrival date & time 04/16/15  1326 History   First MD Initiated Contact with Patient 04/16/15 1329     Chief Complaint  Patient presents with  . Rash     HPI  [name] is an 65 y.o. female with history of CHF, COPD, chronic respiratory failure, DM, HTN, HLD, obesity who presents to the ED for evaluation of a rash. She apparently called EMS with complaints of SOB in order to get transportation to the hospital. Pt denies SOB or chest pain currently. She states she is concerned about a diffuse, itchy rash she has had for the past several weeks. She states that when she was in the hospital for her recent admission they gave her "insulin" to help with her itching. Pt states her home health nurse was worried it was contagious. Pt states she has had an itchy rash of small bumps and patches all over her elbows, arms, and torso. SHe states she has not tried anything at home to help with her symptoms. She denies new exposures. Denies new products. Denies recent travel, camping, hiking. She has not seen any insects bite her. She denies contacts at home with similar symptoms. Denies any discharge, bleeding, vesicles, or pustules. Denies fever or chills. Pt reports she had received referral to dermatology and had been in the process of scheduling an appointment when she decided to come to the hospital instead.  Past Medical History  Diagnosis Date  . Asthma     with exaerbation and admission in 5/12. no h/ intubation. Is also suspected to have some degree of restrictive pattern and moderate emphysema  (per Dr. Teddy Spike note0 and h/o smoking in past.   . Obesity hypoventilation syndrome (HCC)     followed with Dr. Stanton Kidney ([facility] pulm) once in 2012.   Marland Kitchen OSA (obstructive sleep apnea)     on CPAP qhs. Sleep study in 2009 confirmed. Not very compliant with CPAP  . Diastolic CHF (HCC)     Echo 2009 confirmed (poor acoustic window) , EF 55%,, mild RVH  . Physical deconditioning    wheelchair bound, unable to perforrm ADLs at home  . HLD (hyperlipidemia)   . COPD (chronic obstructive pulmonary disease) (HCC)     uses O2 4L   . Dysrhythmia     atrial fib  . Diabetes mellitus without complication (HCC)   . Anxiety   . Chronic respiratory failure [facility])    Past Surgical History  Procedure Laterality Date  . Cesarean section    . Ankle fusion Left 01/18/2014    Procedure: Left Tibiocalcaneal Fusion;  Surgeon: Nadara Mustard, MD;  Location: [facility] OR;  Service: Orthopedics;  Laterality: Left;   Family History  Problem Relation Age of Onset  . COPD Father    Social History  Substance Use Topics  . Smoking status: Former Smoker -- 1.00 packs/day for 30 years    Types: Cigarettes    Quit date: 04/05/1993  . Smokeless tobacco: Never Used  . Alcohol Use: No     Comment: " drank beer years ago "   OB History    No data available     Review of Systems  All other systems reviewed and are negative.     Allergies  Codeine and Fentanyl  Home Medications   Prior to Admission medications   Medication Sig Start Date End Date Taking? Authorizing Provider  aspirin EC 81 MG tablet Take 81 mg by mouth daily.  Yes Historical Provider, MD  budesonide (PULMICORT) 0.5 MG/2ML nebulizer solution Take 2 mLs (0.5 mg total) by nebulization 2 (two) times daily. 01/09/15  Yes Bernadene PersonKathryn A Whiteheart, NP  carvedilol (COREG) 3.125 MG tablet Take 3.125 mg by mouth daily.   Yes Historical Provider, MD  albuterol (PROVENTIL HFA;VENTOLIN HFA) 108 (90 BASE) MCG/ACT inhaler Inhale 2 puffs into the lungs every 6 (six) hours as needed for wheezing or shortness of breath. 01/09/15   Bernadene PersonKathryn A Whiteheart, NP  diltiazem (DILACOR XR) 180 MG 24 hr capsule Take 180 mg by mouth daily.    Historical Provider, MD  loratadine (CLARITIN) 10 MG tablet Take 1 tablet (10 mg total) by mouth daily. 01/09/15   Bernadene PersonKathryn A Whiteheart, NP  pantoprazole (PROTONIX) 40 MG tablet Take 1 tablet (40 mg total) by mouth  daily at 12 noon. 12/03/13   Zannie CovePreetha Joseph, MD  pravastatin (PRAVACHOL) 20 MG tablet Take 20 mg by mouth daily at 6 PM.     Historical Provider, MD  predniSONE (DELTASONE) 20 MG tablet Take 3 tablets (60 mg total) by mouth daily with breakfast. 03/01/15   Rhetta MuraJai-Gurmukh Samtani, MD  rivaroxaban (XARELTO) 20 MG TABS tablet Take 1 tablet (20 mg total) by mouth daily with supper. 03/01/15   Rhetta MuraJai-Gurmukh Samtani, MD  spironolactone (ALDACTONE) 25 MG tablet Take 25 mg by mouth daily.    Historical Provider, MD  Vitamin D, Ergocalciferol, (DRISDOL) 50000 UNITS CAPS capsule Take 50,000 Units by mouth 2 (two) times a week.    Historical Provider, MD   BP 128/75 mmHg  Pulse 99  Temp(Src) 98.3 F (36.8 C) (Oral)  Resp 16  SpO2 95% Physical Exam  Constitutional: She is oriented to person, place, and time. Nasal cannula in place.  Chronically ill appearing. Nasal cannula in place (chronic respiratory failure)  HENT:  Right Ear: External ear normal.  Left Ear: External ear normal.  Nose: Nose normal.  Mouth/Throat: Oropharynx is clear and moist. No oral lesions. No oropharyngeal exudate.  Eyes: Conjunctivae and EOM are normal. Pupils are equal, round, and reactive to light.  Neck: Normal range of motion. Neck supple.  Cardiovascular: Normal rate, regular rhythm and normal heart sounds.   Pulmonary/Chest: Effort normal. No respiratory distress.  Abdominal: Soft. She exhibits no distension. There is no tenderness.  Musculoskeletal: She exhibits no edema.  Neurological: She is alert and oriented to person, place, and time. No cranial nerve deficit.  Skin:  Skin is diffusely extremely dry and scaling. Pt has eczematous patches on elbows, forearms, and knees. She has diffuse excoriation marks and scarring on arms, abdomen, back, and legs. There are few scattered scabbed over papular lesions. No vesicles, pustules, or uticaria.   Psychiatric: She has a normal mood and affect.  Nursing note and vitals  reviewed.   ED Course  Procedures (including critical care time) Labs Review Labs Reviewed - No data to display  Imaging Review No results found. I have personally reviewed and evaluated these images and lab results as part of my medical decision-making.   EKG Interpretation None      MDM   Final diagnoses:  Eczema    Pt with pruritic rash, plaques, and extremely dry skin consistent with eczema, possibly exacerbated by pt's dusty/cluttered living conditions and recent weather change. No pustules, drainage, abscess, warmth, erythema to suggest infection. Will give decadron here to help with inflammation and give vistaril for itching. Will give rx for desonide lotion and vistaril. Encouraged pt to f/u with derm. Encouraged  Aveeno, Cetaphil, or Eucerin lotion use.     Carlene Coria, PA-C 04/16/15 1433  Arby Barrette, MD 04/17/15 1345

## 2015-04-16 NOTE — Discharge Instructions (Signed)
You were seen in the emergency room today for evaluation of a rash. Your symptoms are likely due to eczema. I will give you a prescription for a steroid lotion to help your skin stay moisturized and help with inflammation. I will also give you a prescription for Vistaril to help with the itching. Please follow up with your primary care provider within one week for further evaluation. If your symptoms persist I suggest you contact the dermatologist to schedule an appointment like you had originally planned to do.

## 2015-06-04 DEATH — deceased

## 2015-07-05 DEATH — deceased

## 2016-01-22 IMAGING — CR DG ANKLE PORT 2V*R*
2 series · 2 of 2 positions shown · non-contrast
Comparison: 04/23/2007

CLINICAL DATA: Found unresponsive.  Obvious deformity.

EXAM:
PORTABLE RIGHT ANKLE - 2 VIEW

[ap]
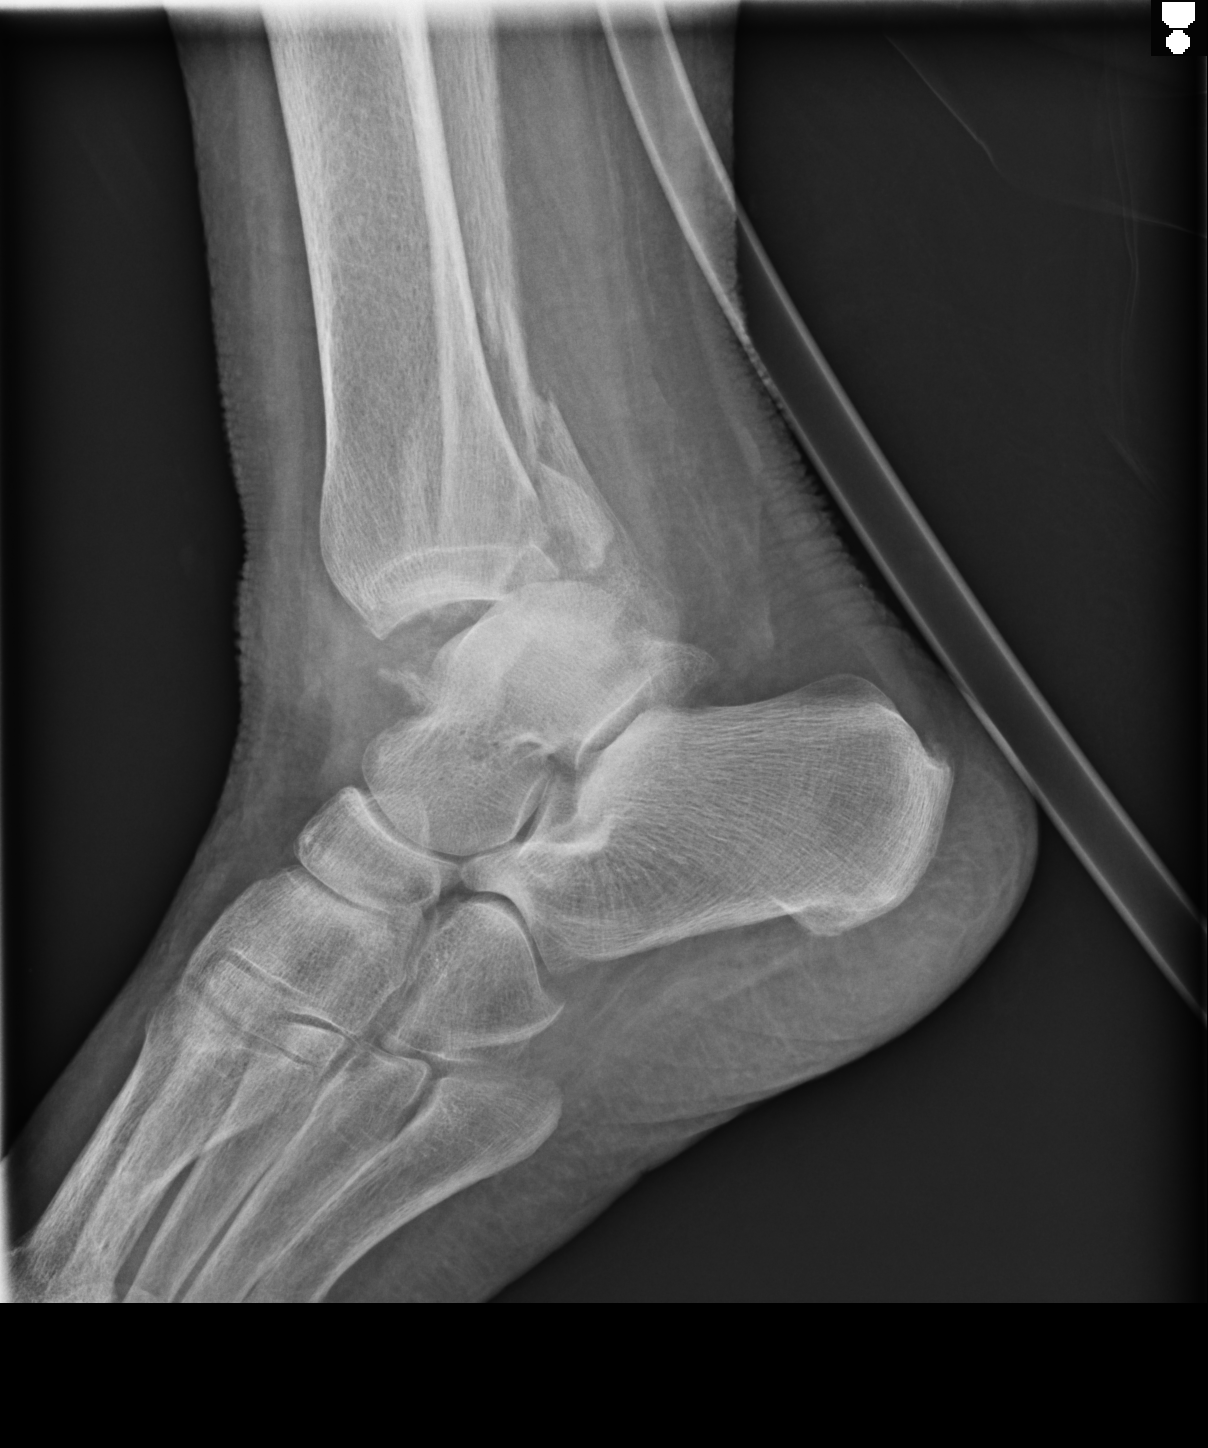

[lat]
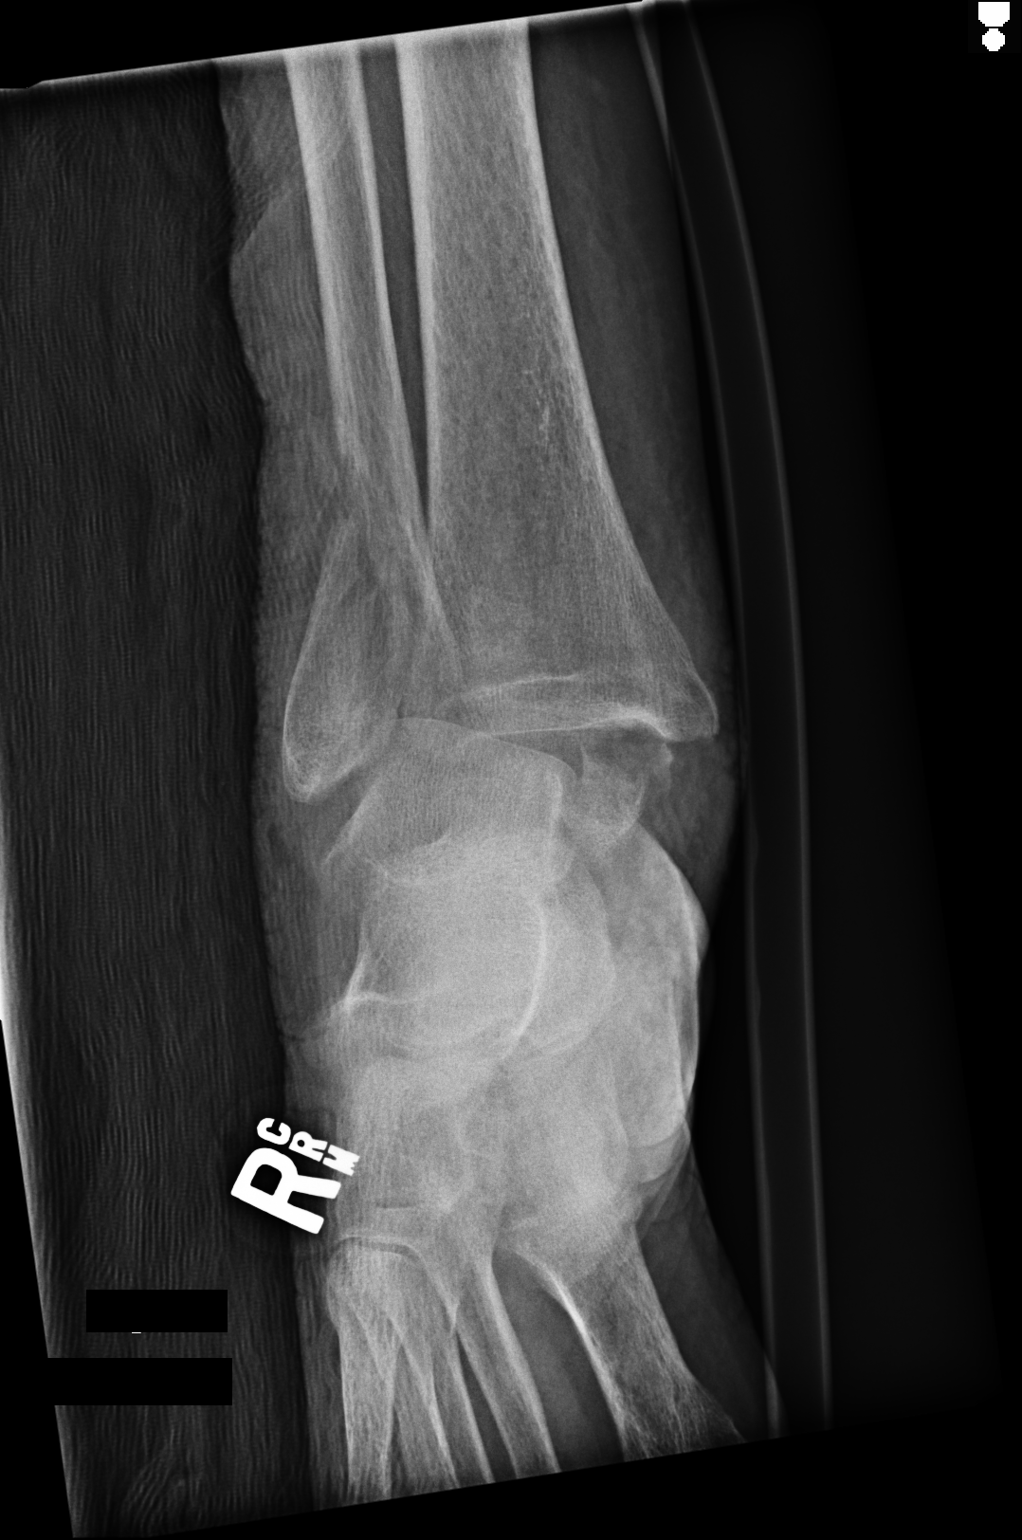

[2 of 2 positions shown; findings below may reference images not displayed]

FINDINGS: Portable AP and lateral views. Comminuted distal fibular fracture.
Comminuted medial and posterior tibial fractures. Widening of the
ankle mortise with relative posterior and lateral subluxation of the
talar dome relative to the distal tibia. Base of fifth metatarsal
intact. Overlying soft tissue swelling.
IMPRESSION: Comminuted fracture subluxation involving the distal tibia and
fibula. Suboptimally evaluated on these portable radiographs.
Consider further evaluation with CT.

## 2016-01-22 IMAGING — CR DG TIBIA/FIBULA PORT 2V*L*
4 series · 4 of 4 positions shown · non-contrast
Comparison: Left ankle radiograph 11/13/2013.

CLINICAL DATA: Postreduction film.

EXAM:
PORTABLE LEFT TIBIA AND FIBULA - 2 VIEW

[AP (1 of 3)]
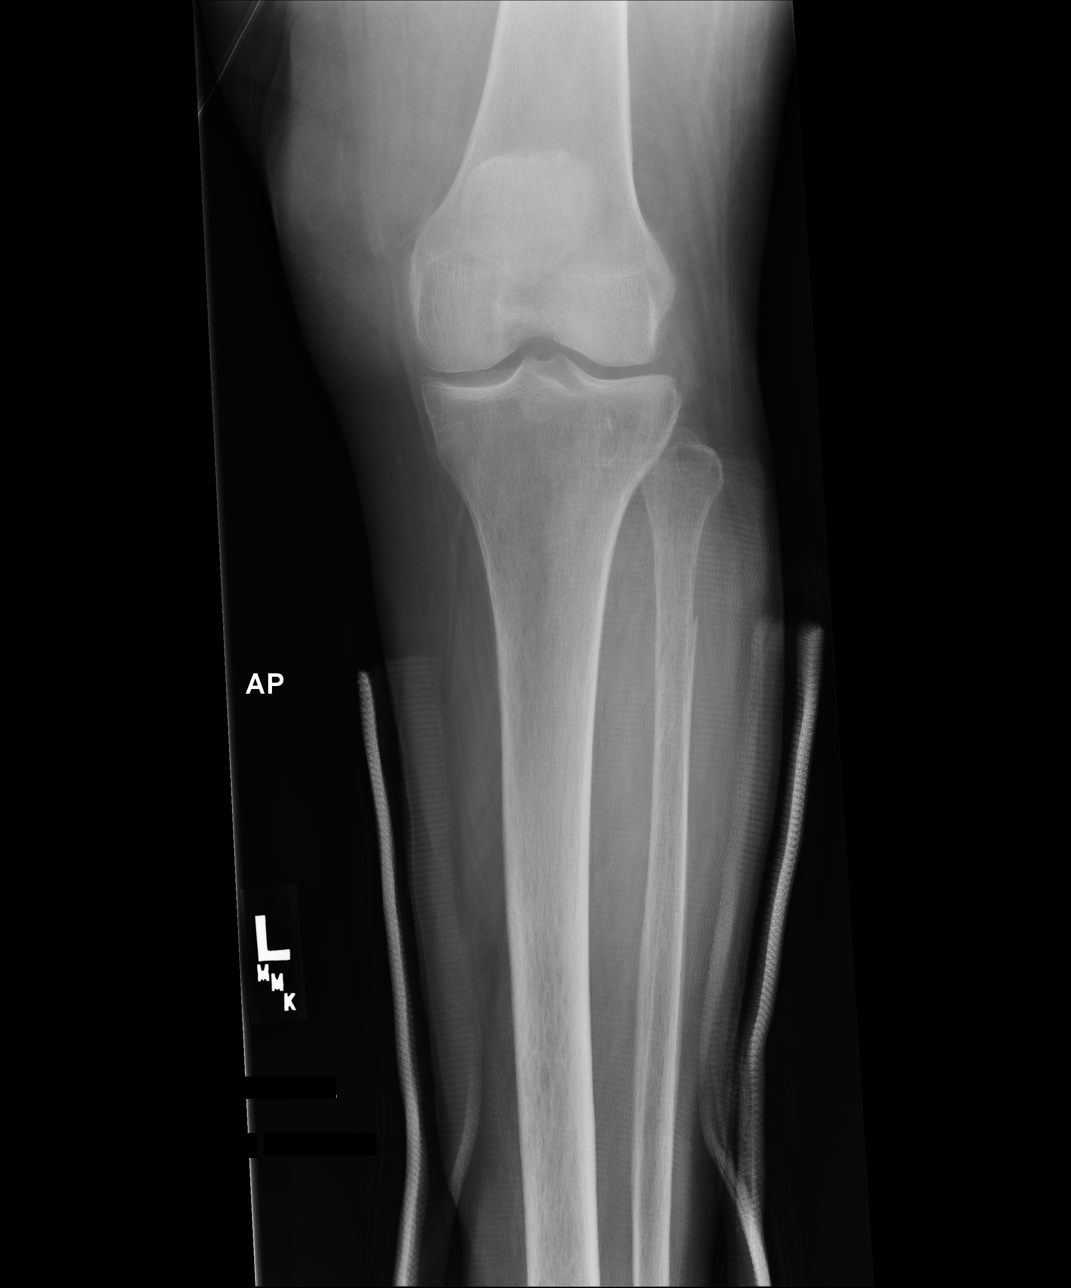

[lateral]
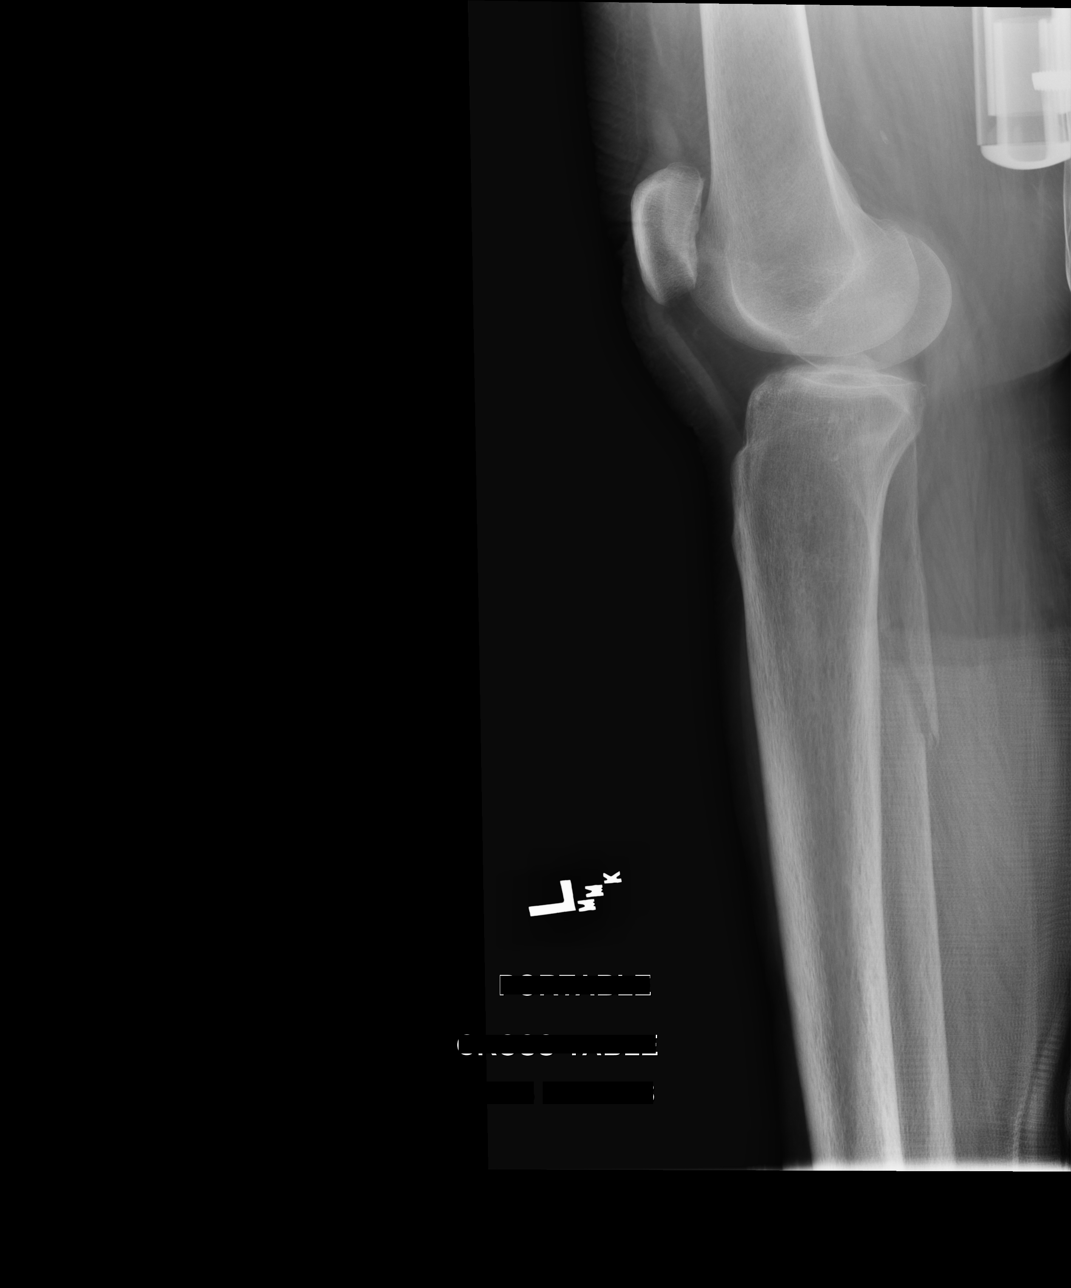

[AP (2 of 3)]
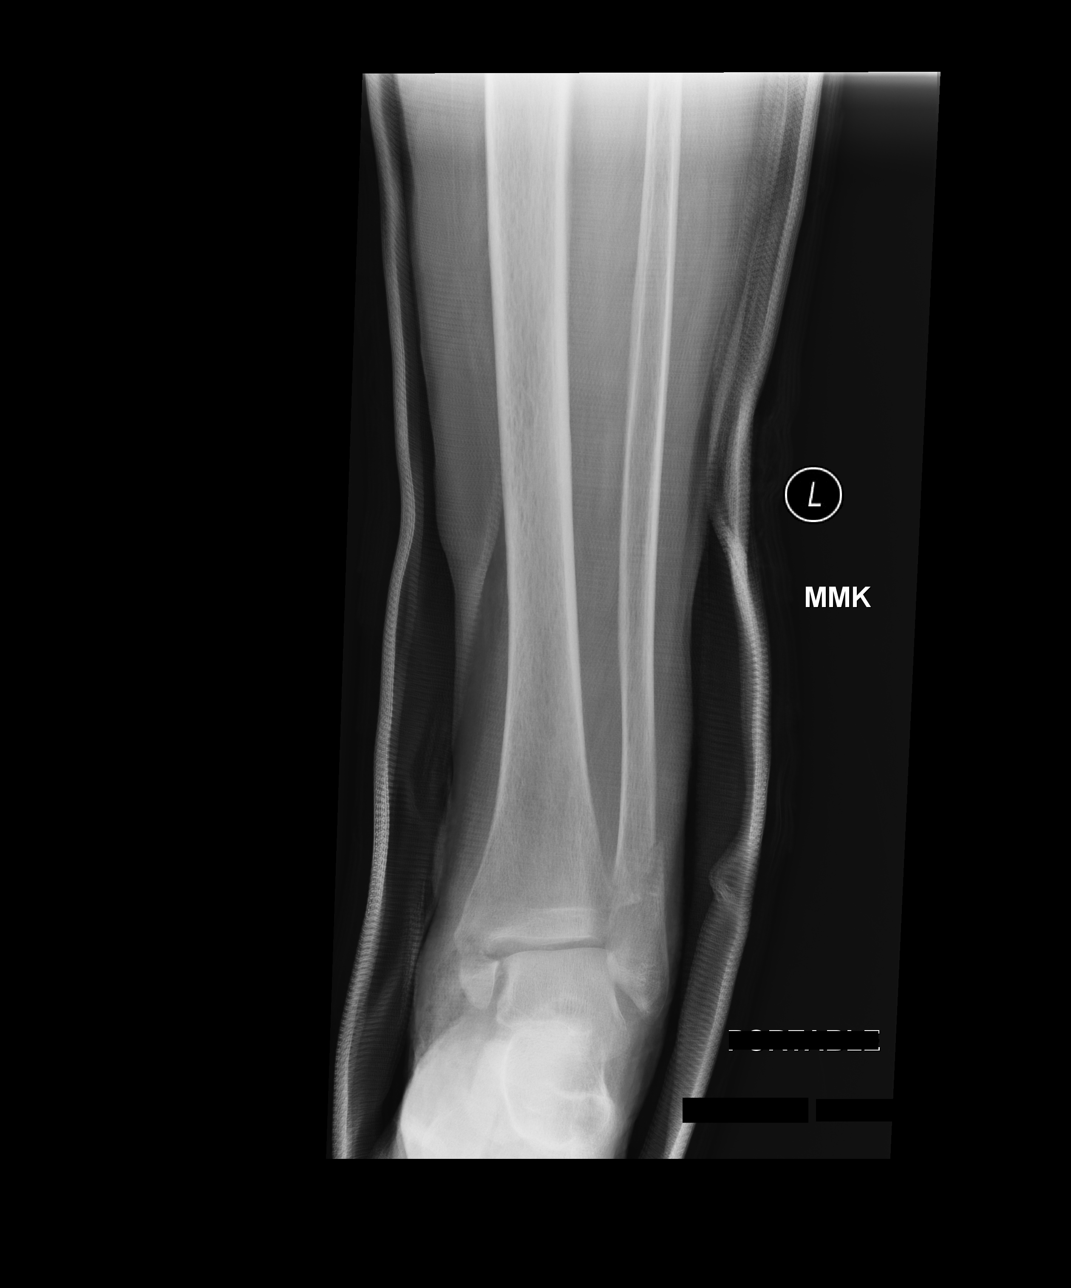

[AP (3 of 3)]
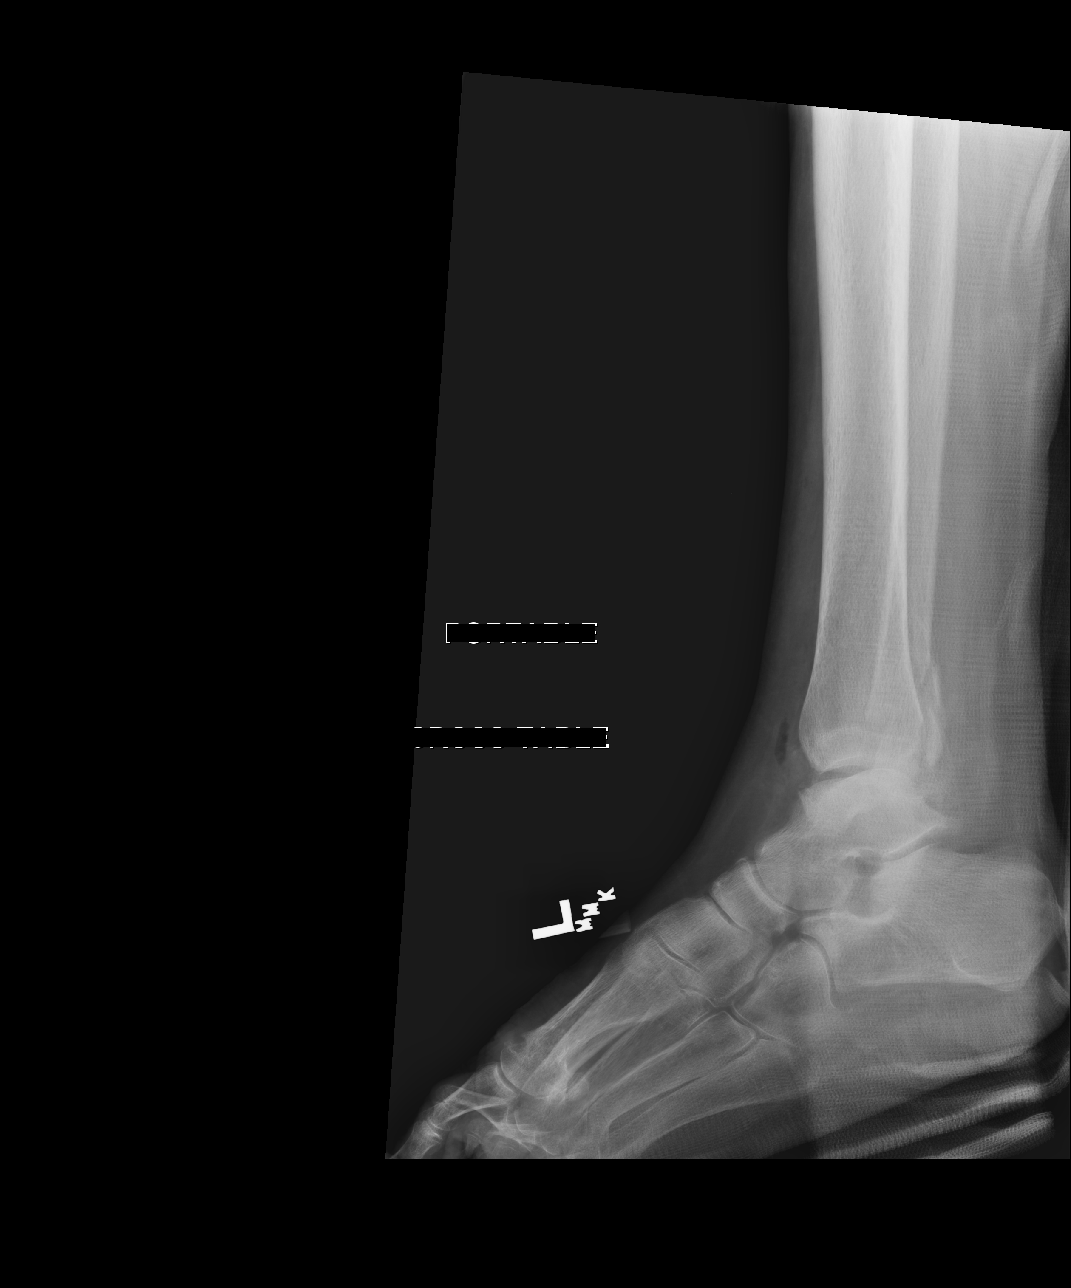

[4 of 4 positions shown; findings below may reference images not displayed]

FINDINGS: Four views of the left tibia and fibula demonstrate interval close
reduction and splint fixation of previously noted tibial and fibular
fractures. Previously noted trimalleolar fracture of the ankle is
again noted, however, alignment is significantly improved, with
decreased displacement of each of the fractures. The medial
malleolar fragment remains approximately 9 mm inferiorly displaced,
and the lateral malleolar fragment remains approximately 2 mm
posteriorly displace. The posterior malleolar fragment is also
approximately 3 mm posteriorly displaced. There is a small amount of
gas in the soft tissues anterior to the tibiotalar joint, which
could suggest that this is an open fracture. There is also an
oblique fracture through the proximal third of the fibular diaphysis
with approximately 4 mm of anterior displacement.
IMPRESSION: 1. Interval closed reduction and splint fixation for trimalleolar
fracture of the left ankle with improved alignment, as above. Small
locule of gas in the soft tissues anterior to the tibiotalar joint
may suggest that this is an open fracture.
2. There is also a minimally displaced oblique fracture through the
proximal third of the fibular diaphysis, as above.
